# Patient Record
Sex: Female | Born: 1980 | Race: Black or African American | Hispanic: No | Marital: Married | Smoking: Current every day smoker
Health system: Southern US, Community
[De-identification: ages and names within clinical notes are randomized; demographics above are authoritative.]

## PROBLEM LIST (undated history)

## (undated) DIAGNOSIS — R51 Headache: Secondary | ICD-10-CM

## (undated) DIAGNOSIS — R0602 Shortness of breath: Secondary | ICD-10-CM

## (undated) DIAGNOSIS — F32A Depression, unspecified: Secondary | ICD-10-CM

## (undated) DIAGNOSIS — M797 Fibromyalgia: Secondary | ICD-10-CM

## (undated) DIAGNOSIS — I10 Essential (primary) hypertension: Secondary | ICD-10-CM

## (undated) DIAGNOSIS — M549 Dorsalgia, unspecified: Secondary | ICD-10-CM

## (undated) DIAGNOSIS — G5600 Carpal tunnel syndrome, unspecified upper limb: Secondary | ICD-10-CM

## (undated) DIAGNOSIS — G8929 Other chronic pain: Secondary | ICD-10-CM

## (undated) DIAGNOSIS — N921 Excessive and frequent menstruation with irregular cycle: Secondary | ICD-10-CM

## (undated) DIAGNOSIS — F329 Major depressive disorder, single episode, unspecified: Secondary | ICD-10-CM

## (undated) DIAGNOSIS — F1410 Cocaine abuse, uncomplicated: Secondary | ICD-10-CM

## (undated) DIAGNOSIS — M1990 Unspecified osteoarthritis, unspecified site: Secondary | ICD-10-CM

## (undated) HISTORY — PX: WISDOM TOOTH EXTRACTION: SHX21

## (undated) HISTORY — DX: Depression, unspecified: F32.A

## (undated) HISTORY — DX: Dorsalgia, unspecified: M54.9

## (undated) HISTORY — DX: Major depressive disorder, single episode, unspecified: F32.9

## (undated) HISTORY — PX: TUBAL LIGATION: SHX77

## (undated) HISTORY — DX: Carpal tunnel syndrome, unspecified upper limb: G56.00

---
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
## 2002-04-04 ENCOUNTER — Encounter: Admission: RE | Admit: 2002-04-04 | Discharge: 2002-04-04 | Payer: Self-pay | Admitting: Family Medicine

## 2002-04-04 ENCOUNTER — Other Ambulatory Visit: Admission: RE | Admit: 2002-04-04 | Discharge: 2002-04-04 | Payer: Self-pay | Admitting: Family Medicine

## 2002-04-11 ENCOUNTER — Encounter: Admission: RE | Admit: 2002-04-11 | Discharge: 2002-04-11 | Payer: Self-pay | Admitting: Family Medicine

## 2002-04-25 ENCOUNTER — Encounter: Admission: RE | Admit: 2002-04-25 | Discharge: 2002-04-25 | Payer: Self-pay | Admitting: Family Medicine

## 2002-05-25 ENCOUNTER — Encounter: Admission: RE | Admit: 2002-05-25 | Discharge: 2002-05-25 | Payer: Self-pay | Admitting: Family Medicine

## 2002-06-27 ENCOUNTER — Encounter: Admission: RE | Admit: 2002-06-27 | Discharge: 2002-06-27 | Payer: Self-pay | Admitting: Family Medicine

## 2002-07-06 ENCOUNTER — Encounter: Admission: RE | Admit: 2002-07-06 | Discharge: 2002-07-06 | Payer: Self-pay | Admitting: Family Medicine

## 2002-07-11 ENCOUNTER — Encounter: Admission: RE | Admit: 2002-07-11 | Discharge: 2002-07-11 | Payer: Self-pay | Admitting: Family Medicine

## 2002-08-13 ENCOUNTER — Encounter: Admission: RE | Admit: 2002-08-13 | Discharge: 2002-08-13 | Payer: Self-pay | Admitting: Family Medicine

## 2002-09-05 ENCOUNTER — Encounter: Admission: RE | Admit: 2002-09-05 | Discharge: 2002-09-05 | Payer: Self-pay | Admitting: Family Medicine

## 2002-09-14 ENCOUNTER — Encounter: Admission: RE | Admit: 2002-09-14 | Discharge: 2002-09-14 | Payer: Self-pay | Admitting: Sports Medicine

## 2002-10-15 ENCOUNTER — Encounter: Admission: RE | Admit: 2002-10-15 | Discharge: 2002-10-15 | Payer: Self-pay | Admitting: Sports Medicine

## 2002-11-04 ENCOUNTER — Emergency Department (HOSPITAL_COMMUNITY): Admission: EM | Admit: 2002-11-04 | Discharge: 2002-11-04 | Payer: Self-pay | Admitting: Emergency Medicine

## 2002-11-07 ENCOUNTER — Encounter: Admission: RE | Admit: 2002-11-07 | Discharge: 2002-11-07 | Payer: Self-pay | Admitting: Family Medicine

## 2002-12-07 ENCOUNTER — Encounter: Admission: RE | Admit: 2002-12-07 | Discharge: 2002-12-07 | Payer: Self-pay | Admitting: Family Medicine

## 2003-02-27 ENCOUNTER — Encounter: Admission: RE | Admit: 2003-02-27 | Discharge: 2003-02-27 | Payer: Self-pay | Admitting: Family Medicine

## 2003-05-17 ENCOUNTER — Encounter: Admission: RE | Admit: 2003-05-17 | Discharge: 2003-05-17 | Payer: Self-pay | Admitting: Sports Medicine

## 2003-10-31 ENCOUNTER — Ambulatory Visit: Payer: Self-pay | Admitting: Sports Medicine

## 2003-11-29 ENCOUNTER — Ambulatory Visit: Payer: Self-pay | Admitting: Family Medicine

## 2003-12-21 ENCOUNTER — Emergency Department (HOSPITAL_COMMUNITY): Admission: EM | Admit: 2003-12-21 | Discharge: 2003-12-21 | Payer: Self-pay | Admitting: Emergency Medicine

## 2003-12-23 ENCOUNTER — Ambulatory Visit: Payer: Self-pay | Admitting: Family Medicine

## 2003-12-24 ENCOUNTER — Ambulatory Visit (HOSPITAL_COMMUNITY): Admission: RE | Admit: 2003-12-24 | Discharge: 2003-12-24 | Payer: Self-pay | Admitting: Family Medicine

## 2004-01-31 ENCOUNTER — Ambulatory Visit: Payer: Self-pay | Admitting: Family Medicine

## 2004-03-17 ENCOUNTER — Ambulatory Visit: Payer: Self-pay | Admitting: Family Medicine

## 2004-03-27 ENCOUNTER — Encounter (INDEPENDENT_AMBULATORY_CARE_PROVIDER_SITE_OTHER): Payer: Self-pay | Admitting: Specialist

## 2004-03-27 ENCOUNTER — Ambulatory Visit: Payer: Self-pay | Admitting: Family Medicine

## 2004-03-27 ENCOUNTER — Other Ambulatory Visit: Admission: RE | Admit: 2004-03-27 | Discharge: 2004-03-27 | Payer: Self-pay | Admitting: Family Medicine

## 2004-04-24 ENCOUNTER — Ambulatory Visit: Payer: Self-pay | Admitting: Family Medicine

## 2004-05-08 ENCOUNTER — Ambulatory Visit: Payer: Self-pay | Admitting: Family Medicine

## 2004-07-13 ENCOUNTER — Ambulatory Visit: Payer: Self-pay | Admitting: Family Medicine

## 2004-08-04 ENCOUNTER — Ambulatory Visit: Payer: Self-pay | Admitting: Family Medicine

## 2004-08-31 ENCOUNTER — Emergency Department (HOSPITAL_COMMUNITY): Admission: EM | Admit: 2004-08-31 | Discharge: 2004-08-31 | Payer: Self-pay | Admitting: Family Medicine

## 2004-09-07 ENCOUNTER — Emergency Department (HOSPITAL_COMMUNITY): Admission: EM | Admit: 2004-09-07 | Discharge: 2004-09-07 | Payer: Self-pay | Admitting: Family Medicine

## 2005-04-02 ENCOUNTER — Ambulatory Visit: Payer: Self-pay | Admitting: Family Medicine

## 2005-06-11 ENCOUNTER — Ambulatory Visit: Payer: Self-pay | Admitting: Family Medicine

## 2005-06-14 ENCOUNTER — Ambulatory Visit (HOSPITAL_COMMUNITY): Admission: RE | Admit: 2005-06-14 | Discharge: 2005-06-14 | Payer: Self-pay | Admitting: Family Medicine

## 2005-06-29 ENCOUNTER — Encounter (INDEPENDENT_AMBULATORY_CARE_PROVIDER_SITE_OTHER): Payer: Self-pay | Admitting: *Deleted

## 2005-06-29 LAB — CONVERTED CEMR LAB

## 2005-07-01 ENCOUNTER — Inpatient Hospital Stay (HOSPITAL_COMMUNITY): Admission: AD | Admit: 2005-07-01 | Discharge: 2005-07-01 | Payer: Self-pay | Admitting: Gynecology

## 2005-07-02 ENCOUNTER — Ambulatory Visit: Payer: Self-pay | Admitting: Family Medicine

## 2005-07-02 ENCOUNTER — Other Ambulatory Visit: Admission: RE | Admit: 2005-07-02 | Discharge: 2005-07-02 | Payer: Self-pay | Admitting: Family Medicine

## 2005-08-05 ENCOUNTER — Ambulatory Visit: Payer: Self-pay | Admitting: Family Medicine

## 2005-08-30 ENCOUNTER — Ambulatory Visit (HOSPITAL_COMMUNITY): Admission: RE | Admit: 2005-08-30 | Discharge: 2005-08-30 | Payer: Self-pay | Admitting: Family Medicine

## 2005-09-08 ENCOUNTER — Ambulatory Visit: Payer: Self-pay | Admitting: Family Medicine

## 2005-10-07 ENCOUNTER — Ambulatory Visit: Payer: Self-pay | Admitting: Family Medicine

## 2005-10-27 ENCOUNTER — Ambulatory Visit: Payer: Self-pay | Admitting: Family Medicine

## 2005-11-17 ENCOUNTER — Ambulatory Visit (HOSPITAL_COMMUNITY): Admission: RE | Admit: 2005-11-17 | Discharge: 2005-11-17 | Payer: Self-pay | Admitting: Family Medicine

## 2005-11-25 ENCOUNTER — Ambulatory Visit: Payer: Self-pay | Admitting: Sports Medicine

## 2005-11-25 ENCOUNTER — Ambulatory Visit: Payer: Self-pay | Admitting: *Deleted

## 2005-11-25 ENCOUNTER — Inpatient Hospital Stay (HOSPITAL_COMMUNITY): Admission: AD | Admit: 2005-11-25 | Discharge: 2005-11-25 | Payer: Self-pay | Admitting: Family Medicine

## 2005-12-02 ENCOUNTER — Ambulatory Visit: Payer: Self-pay | Admitting: Family Medicine

## 2005-12-08 ENCOUNTER — Inpatient Hospital Stay (HOSPITAL_COMMUNITY): Admission: AD | Admit: 2005-12-08 | Discharge: 2005-12-08 | Payer: Self-pay | Admitting: Gynecology

## 2005-12-08 ENCOUNTER — Ambulatory Visit: Payer: Self-pay | Admitting: Certified Nurse Midwife

## 2005-12-10 ENCOUNTER — Inpatient Hospital Stay (HOSPITAL_COMMUNITY): Admission: AD | Admit: 2005-12-10 | Discharge: 2005-12-10 | Payer: Self-pay | Admitting: Obstetrics & Gynecology

## 2005-12-10 ENCOUNTER — Ambulatory Visit: Payer: Self-pay | Admitting: *Deleted

## 2005-12-10 ENCOUNTER — Ambulatory Visit: Payer: Self-pay | Admitting: Family Medicine

## 2005-12-15 ENCOUNTER — Ambulatory Visit: Payer: Self-pay | Admitting: Family Medicine

## 2005-12-20 ENCOUNTER — Ambulatory Visit: Payer: Self-pay | Admitting: Family Medicine

## 2005-12-27 ENCOUNTER — Ambulatory Visit: Payer: Self-pay | Admitting: Sports Medicine

## 2006-01-06 ENCOUNTER — Ambulatory Visit: Payer: Self-pay | Admitting: Sports Medicine

## 2006-01-13 ENCOUNTER — Ambulatory Visit: Payer: Self-pay | Admitting: Sports Medicine

## 2006-01-14 ENCOUNTER — Ambulatory Visit: Payer: Self-pay | Admitting: Gynecology

## 2006-01-14 ENCOUNTER — Inpatient Hospital Stay (HOSPITAL_COMMUNITY): Admission: AD | Admit: 2006-01-14 | Discharge: 2006-01-17 | Payer: Self-pay | Admitting: Gynecology

## 2006-01-15 ENCOUNTER — Encounter (INDEPENDENT_AMBULATORY_CARE_PROVIDER_SITE_OTHER): Payer: Self-pay | Admitting: Specialist

## 2006-03-22 ENCOUNTER — Ambulatory Visit: Payer: Self-pay | Admitting: Family Medicine

## 2006-04-29 ENCOUNTER — Encounter (INDEPENDENT_AMBULATORY_CARE_PROVIDER_SITE_OTHER): Payer: Self-pay | Admitting: *Deleted

## 2006-08-31 ENCOUNTER — Ambulatory Visit: Payer: Self-pay | Admitting: Sports Medicine

## 2006-08-31 ENCOUNTER — Encounter (INDEPENDENT_AMBULATORY_CARE_PROVIDER_SITE_OTHER): Payer: Self-pay | Admitting: Family Medicine

## 2006-08-31 ENCOUNTER — Other Ambulatory Visit: Admission: RE | Admit: 2006-08-31 | Discharge: 2006-08-31 | Payer: Self-pay | Admitting: Family Medicine

## 2006-08-31 LAB — CONVERTED CEMR LAB
Chlamydia, DNA Probe: NEGATIVE
GC Probe Amp, Genital: NEGATIVE

## 2006-09-06 ENCOUNTER — Encounter (INDEPENDENT_AMBULATORY_CARE_PROVIDER_SITE_OTHER): Payer: Self-pay | Admitting: Family Medicine

## 2006-10-26 ENCOUNTER — Encounter: Admission: RE | Admit: 2006-10-26 | Discharge: 2006-10-26 | Payer: Self-pay | Admitting: Sports Medicine

## 2006-10-26 ENCOUNTER — Ambulatory Visit: Payer: Self-pay | Admitting: Family Medicine

## 2006-12-28 ENCOUNTER — Telehealth: Payer: Self-pay | Admitting: *Deleted

## 2007-01-16 IMAGING — US US OB TRANSVAGINAL
1 series · 13 of 28 positions shown · non-contrast
Comparison: none

CLINICAL DATA: 11 weeks estimated gestational age with left lower quadrant pain.  
 OBSTETRICAL ULTRASOUND <14 WKS AND TRANSVAGINAL OB US:
TECHNIQUE: Both transabdominal and transvaginal ultrasound examinations were performed for complete evaluation of the gestation as well as the maternal uterus, adnexal regions, and pelvic cul-de-sac.
 Correlation is made with the previous exam on 06/14/05.

[Series 1: us ob transvaginal · 0.14mm/px · 13 of 52 slices shown]
[im 2/52]
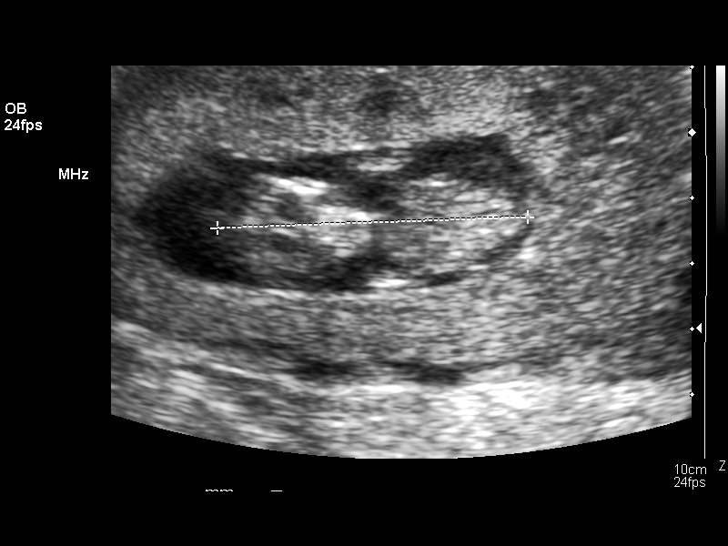
[im 6/52]
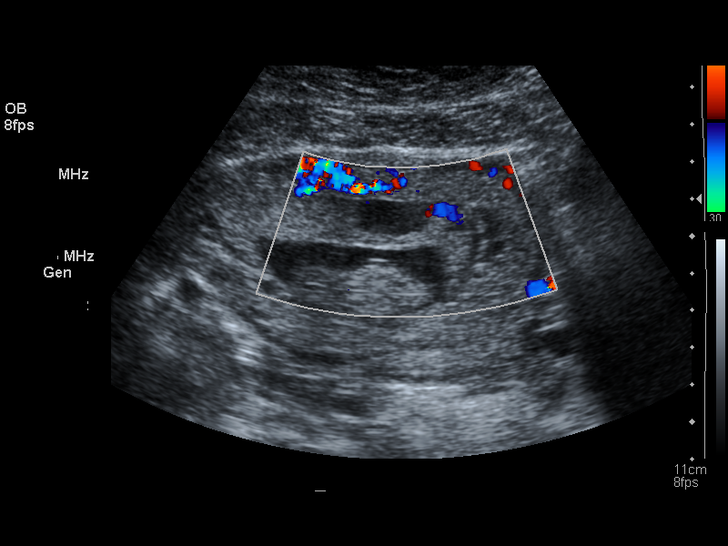
[im 10/52]
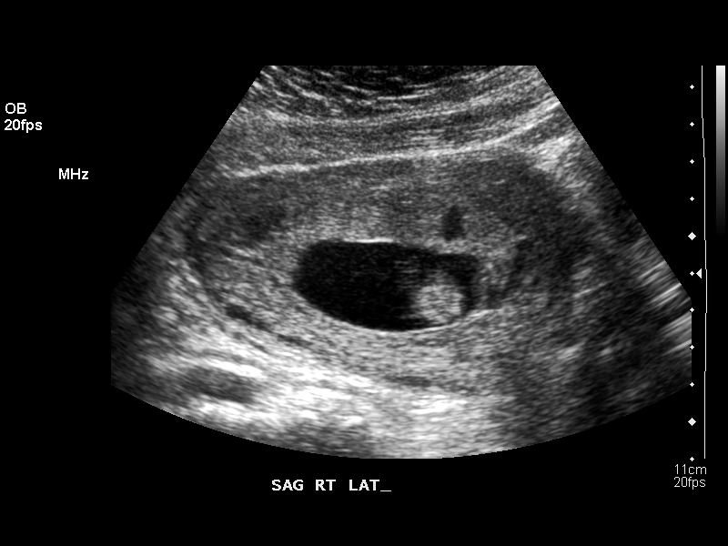
[im 14/52]
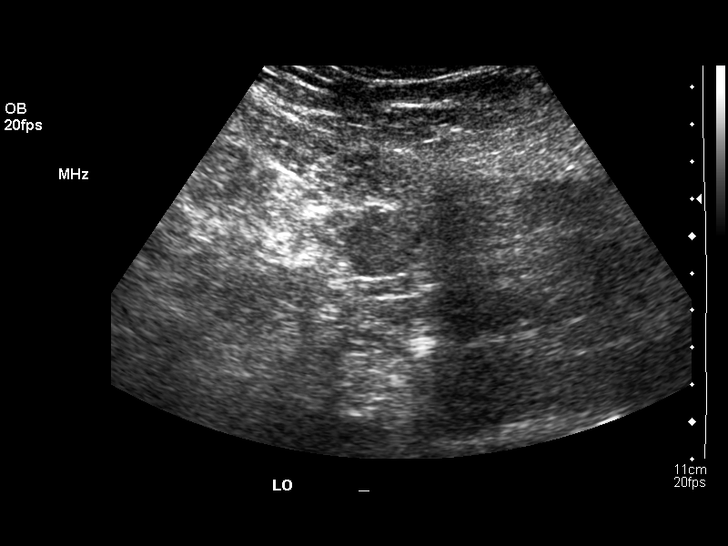
[im 18/52]
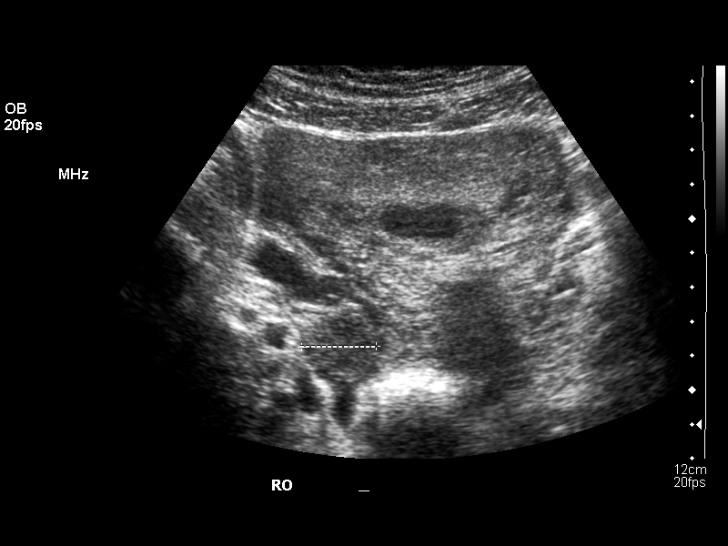
[im 21/52]
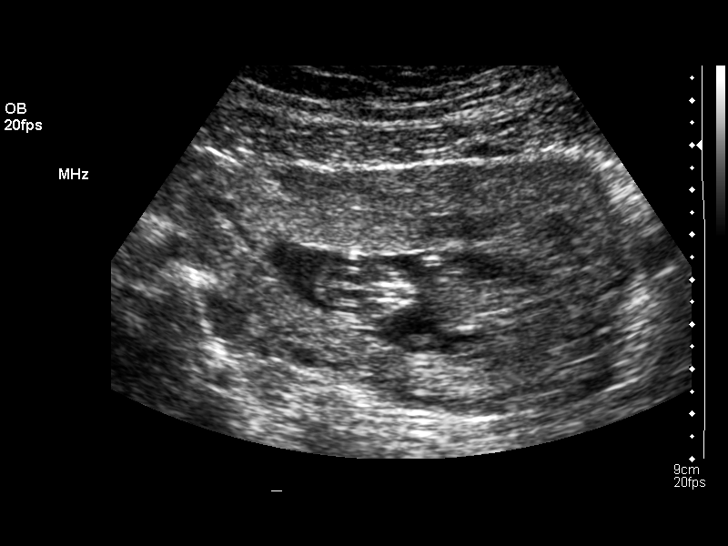
[im 27/52]
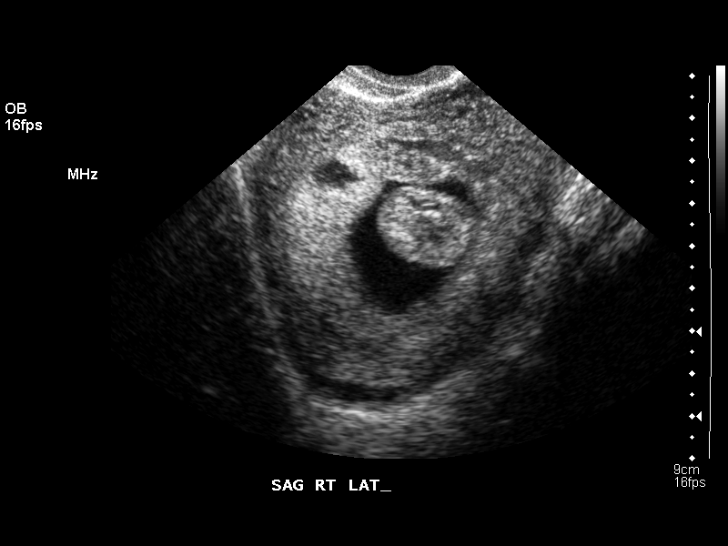
[im 31/52]
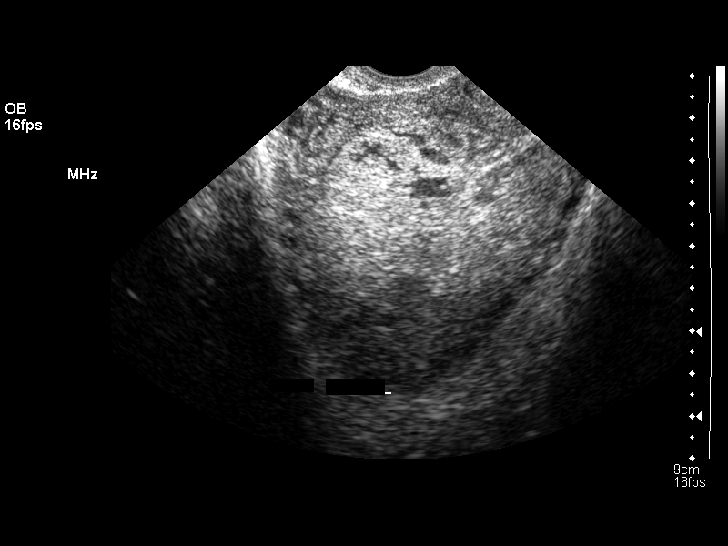
[im 35/52]
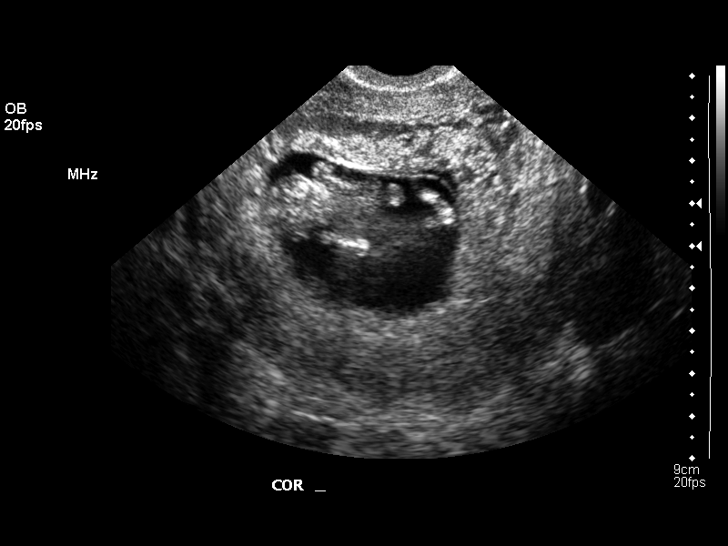
[im 38/52]
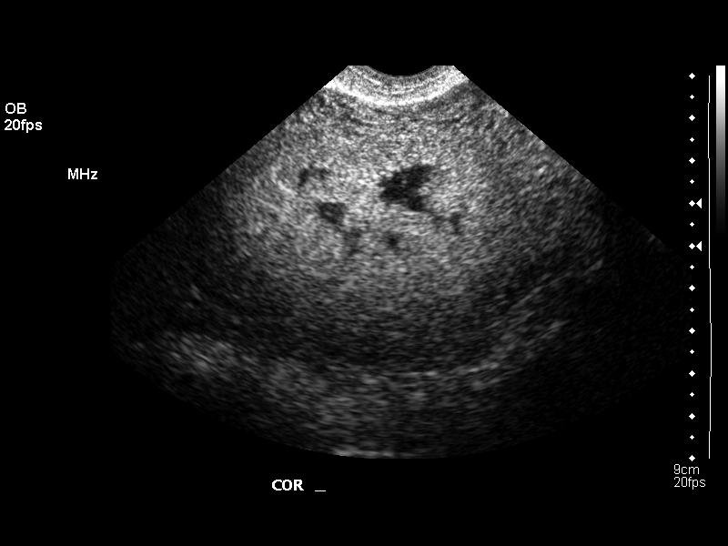
[im 42/52]
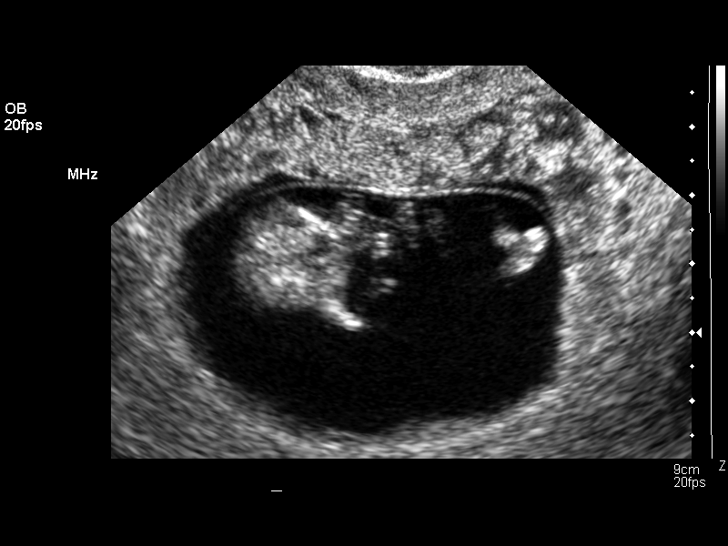
[im 46/52]
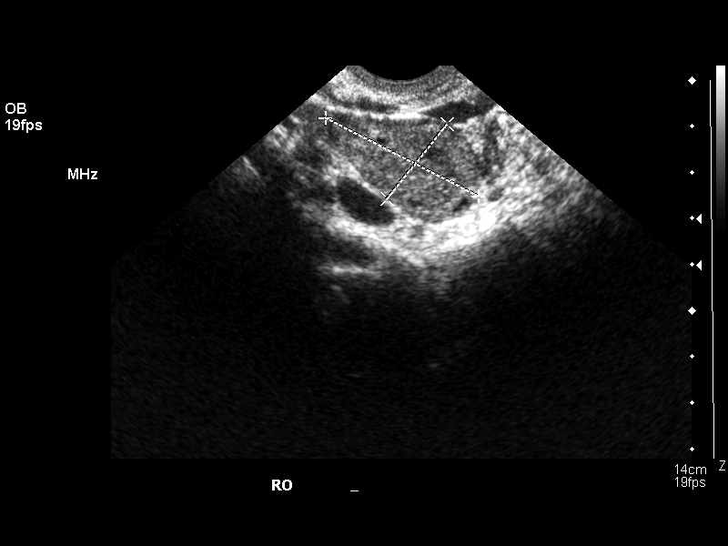
[im 50/52]
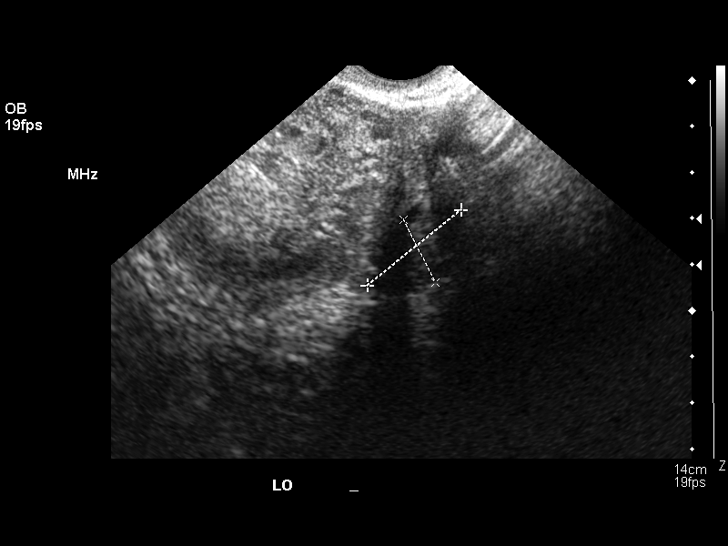

[13 of 28 positions shown; findings below may reference images not displayed]

FINDINGS: Multiple images of the uterus and adnexa were obtained using a transabdominal and endovaginal approaches.
 There is a single intrauterine pregnancy identified that demonstrates an estimated gestational age by crown rump length of 11 weeks and 4 days.  Correlation with expected estimated gestational age by prior ultrasound of 11 weeks and 3 days correlates with appropriate growth.  Limited fetal anatomy could be identified due to the early gestational age.  Full anatomic evaluation is recommended in 7 weeks for complete assessment.
 Both ovaries are seen and have a normal appearance with the right ovary again demonstrating the presence of a corpus luteum cyst measuring 2.5 x 3.7 x 2.1 cm.  The left ovary has a normal appearance measuring 2.6 x 1.5 x 2.6 cm.  No cul-de-sac or periovarian fluid is seen and no separate adnexal masses are noted.
IMPRESSION: 11 week 4 day living intrauterine pregnancy.  Normal ovaries.

## 2007-01-19 ENCOUNTER — Ambulatory Visit: Payer: Self-pay | Admitting: Family Medicine

## 2007-02-02 ENCOUNTER — Encounter (INDEPENDENT_AMBULATORY_CARE_PROVIDER_SITE_OTHER): Payer: Self-pay | Admitting: Family Medicine

## 2007-04-01 ENCOUNTER — Emergency Department (HOSPITAL_COMMUNITY): Admission: EM | Admit: 2007-04-01 | Discharge: 2007-04-01 | Payer: Self-pay | Admitting: Emergency Medicine

## 2007-05-31 ENCOUNTER — Encounter (INDEPENDENT_AMBULATORY_CARE_PROVIDER_SITE_OTHER): Payer: Self-pay | Admitting: Family Medicine

## 2007-05-31 ENCOUNTER — Other Ambulatory Visit: Admission: RE | Admit: 2007-05-31 | Discharge: 2007-05-31 | Payer: Self-pay | Admitting: Pediatrics

## 2007-05-31 ENCOUNTER — Ambulatory Visit: Payer: Self-pay | Admitting: Family Medicine

## 2007-05-31 DIAGNOSIS — F17200 Nicotine dependence, unspecified, uncomplicated: Secondary | ICD-10-CM

## 2007-05-31 LAB — CONVERTED CEMR LAB
Chlamydia, DNA Probe: NEGATIVE
Cholesterol: 132 mg/dL (ref 0–200)
GC Probe Amp, Genital: NEGATIVE
HDL: 50 mg/dL (ref >39)
LDL Cholesterol: 74 mg/dL (ref 0–99)
Total CHOL/HDL Ratio: 2.6
Triglycerides: 41 mg/dL (ref <150)
VLDL: 8 mg/dL (ref 0–40)

## 2007-06-05 LAB — CONVERTED CEMR LAB: Pap Smear: NORMAL

## 2007-06-06 ENCOUNTER — Telehealth: Payer: Self-pay | Admitting: *Deleted

## 2007-06-12 ENCOUNTER — Encounter: Payer: Self-pay | Admitting: *Deleted

## 2007-06-27 IMAGING — US US FETAL BPP W/O NONSTRESS
1 series · 18 of 28 positions shown · non-contrast
Comparison: none

CLINICAL DATA: 25-year-old, gravida 3, para 2 with nonreactive NST.  By assigned EDC of 01/17/06, the patient is 34 weeks 4 days.

[Series 1: us fetal bpp w/o nonstress · non-contrast · 18 of 31 slices shown]
[im 1/31]
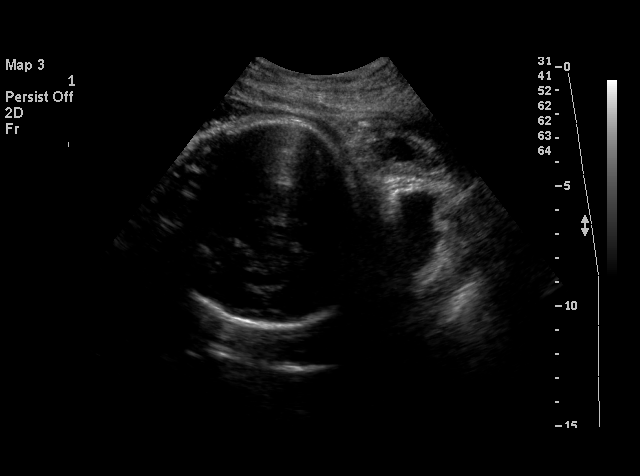
[im 3/31]
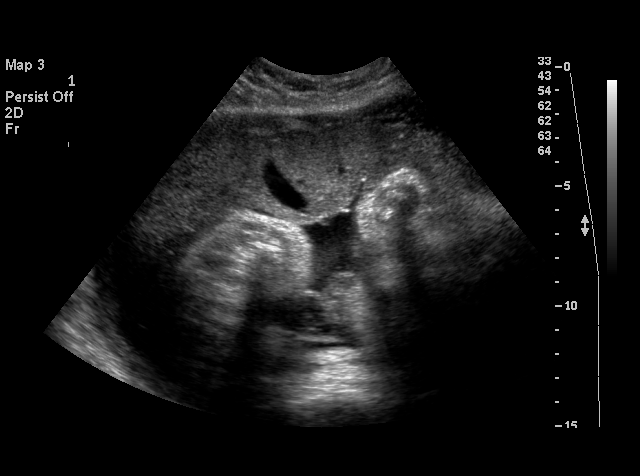
[im 4/31]
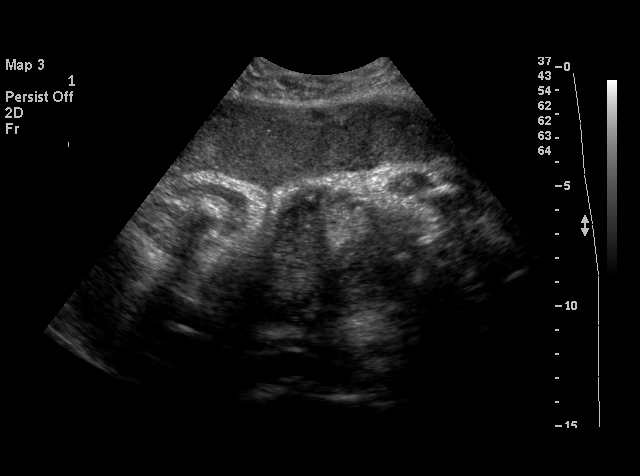
[im 6/31]
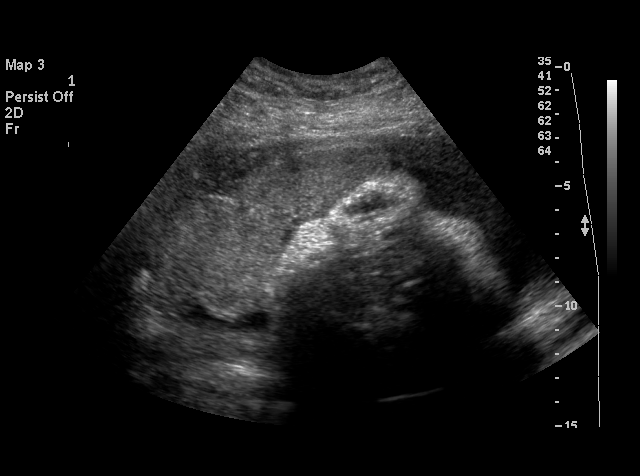
[im 8/31]
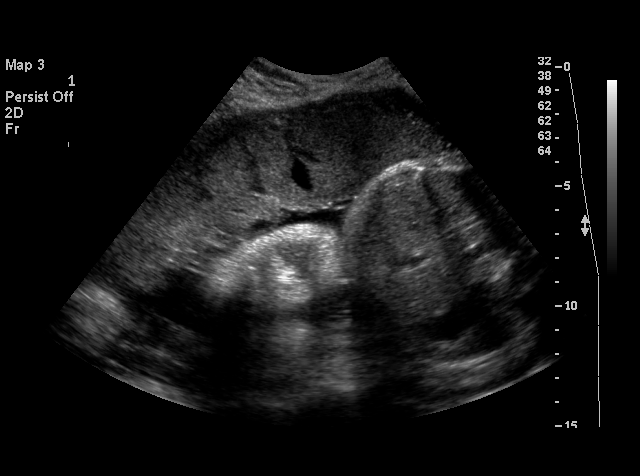
[im 9/31]
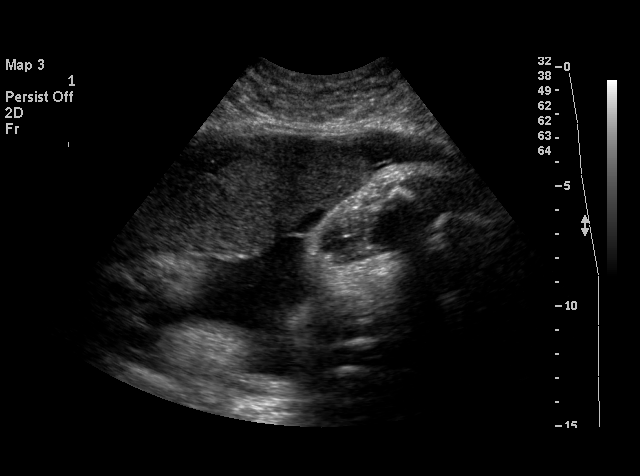
[im 12/31]
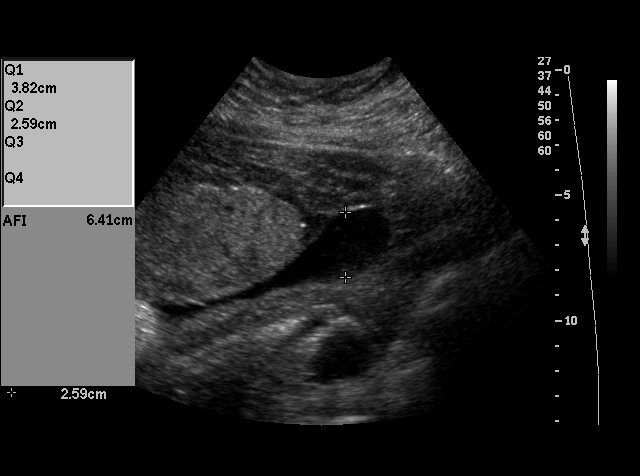
[im 13/31]
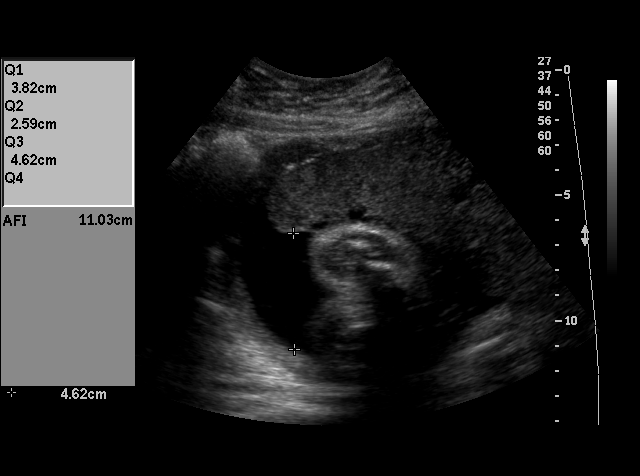
[im 15/31]
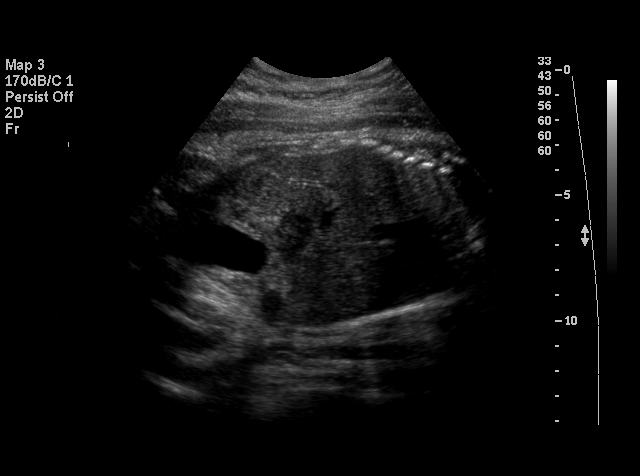
[im 16/31]
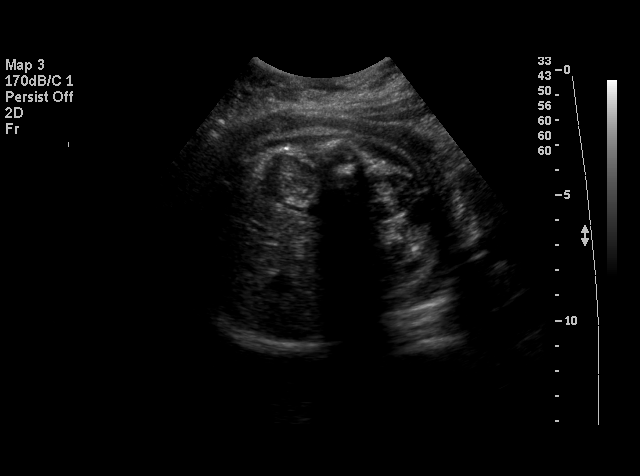
[im 18/31]
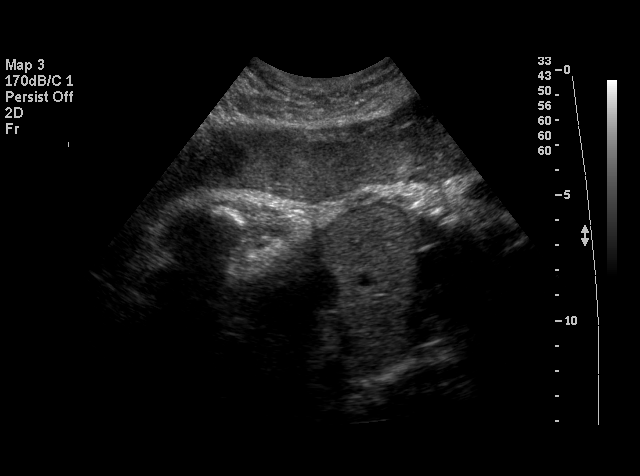
[im 19/31]
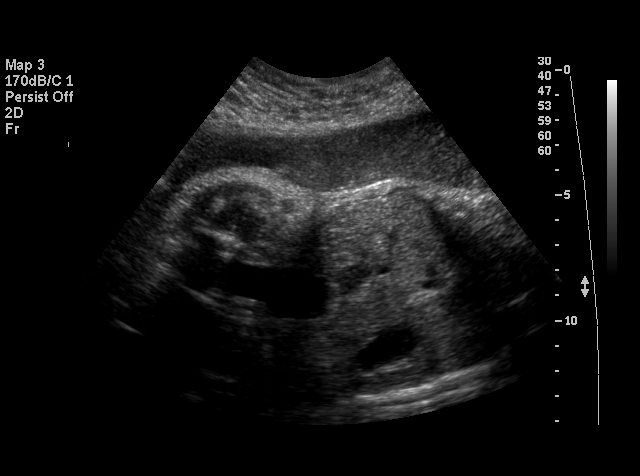
[im 22/31]
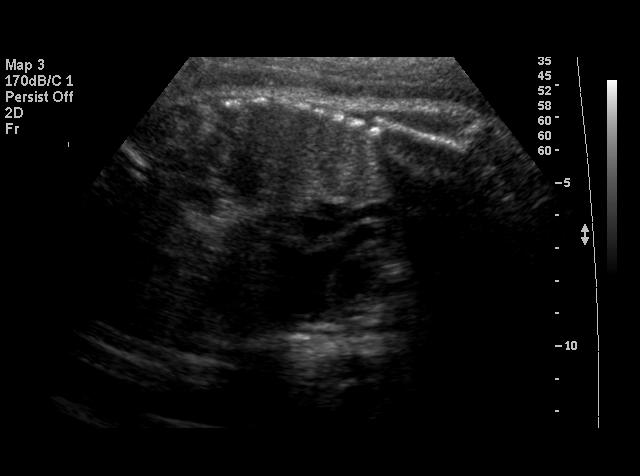
[im 24/31]
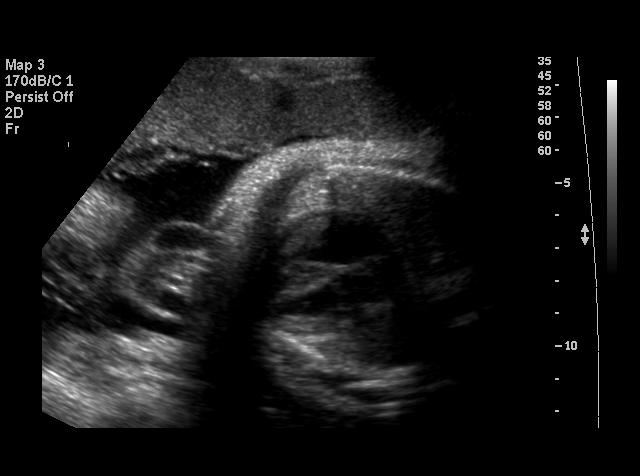
[im 25/31]
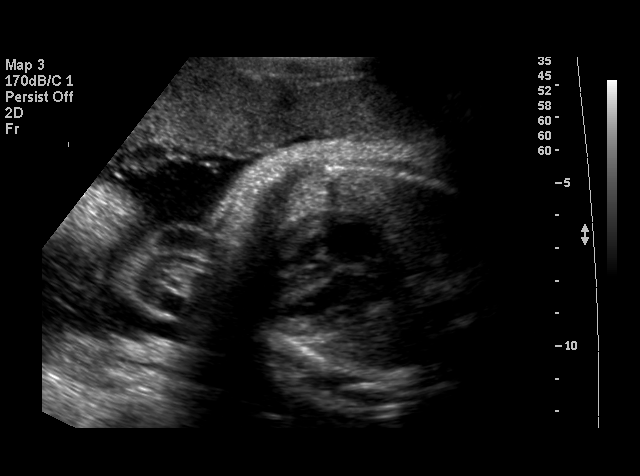
[im 27/31]
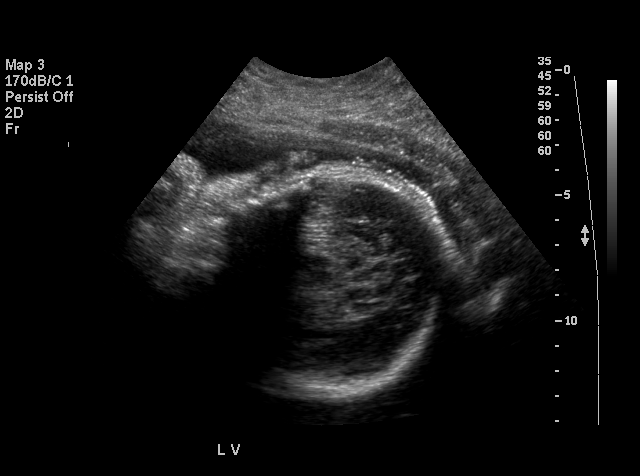
[im 28/31]
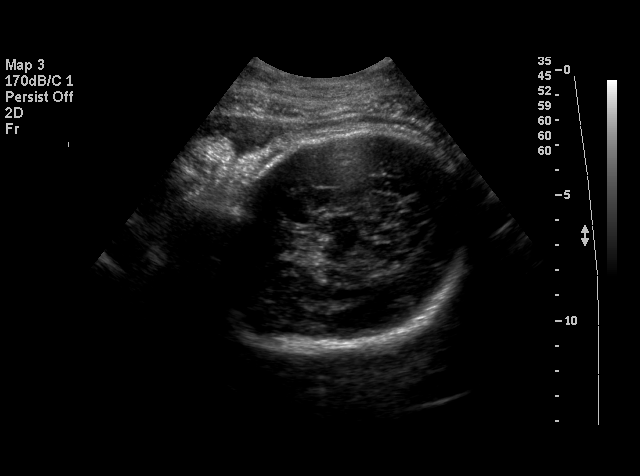
[im 31/31]
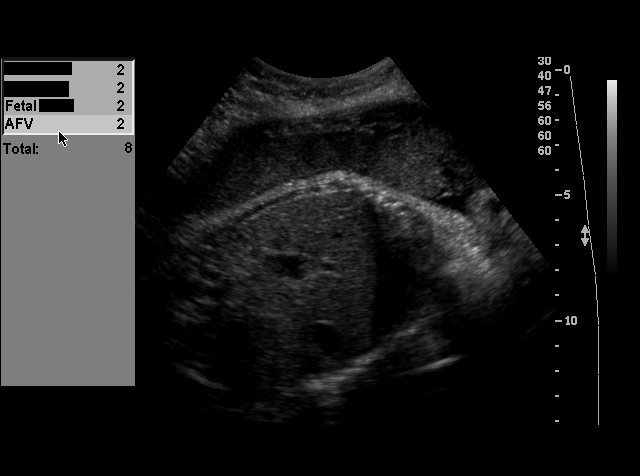

[18 of 28 positions shown; findings below may reference images not displayed]

BIOPHYSICAL PROFILE

 Number of Fetuses:  1
 Heart rate:  154 bpm
 Movement:  Yes
 Breathing:  Yes
 Presentation:  Cephalic
 Placental Location:  Anterior
 Grade:  II
 Previa:  No
 Amniotic Fluid (Subjective):  Normal

 Fetal measurements and complete anatomic evaluation were not requested.  The following fetal anatomy was visualized on this exam:  Lateral ventricles, thalami/CSP, posterior fossa, 4-chamber heart, stomach, kidneys, bladder, LVOT, and diaphragm.  

 BPP SCORING
 Movements:  2
 Breathing:  2
 Tone:  2
 Amniotic Fluid:  2
 Total Score:  8

 MATERNAL UTERINE AND ADNEXAL FINDINGS
 Cervix:  Not evaluated.
IMPRESSION: Single living intrauterine fetus in cephalic presentation.  Amniotic fluid volume is within normal limits.  Biophysical profile of [DATE].

## 2007-11-07 ENCOUNTER — Telehealth (INDEPENDENT_AMBULATORY_CARE_PROVIDER_SITE_OTHER): Payer: Self-pay | Admitting: Family Medicine

## 2007-12-26 ENCOUNTER — Telehealth: Payer: Self-pay | Admitting: Family Medicine

## 2008-01-22 ENCOUNTER — Telehealth (INDEPENDENT_AMBULATORY_CARE_PROVIDER_SITE_OTHER): Payer: Self-pay | Admitting: Family Medicine

## 2008-09-20 ENCOUNTER — Emergency Department (HOSPITAL_COMMUNITY): Admission: EM | Admit: 2008-09-20 | Discharge: 2008-09-21 | Payer: Self-pay | Admitting: Emergency Medicine

## 2008-10-04 ENCOUNTER — Emergency Department (HOSPITAL_COMMUNITY): Admission: EM | Admit: 2008-10-04 | Discharge: 2008-10-04 | Payer: Self-pay | Admitting: Emergency Medicine

## 2008-10-04 ENCOUNTER — Ambulatory Visit: Payer: Self-pay | Admitting: Family Medicine

## 2008-10-04 LAB — CONVERTED CEMR LAB
Beta hcg, urine, semiquantitative: NEGATIVE
Bilirubin Urine: NEGATIVE
Glucose, Urine, Semiquant: NEGATIVE
Ketones, urine, test strip: NEGATIVE
Nitrite: NEGATIVE
Protein, U semiquant: 30
Specific Gravity, Urine: 1.015
Urobilinogen, UA: 2
pH: 7

## 2008-11-06 ENCOUNTER — Encounter: Payer: Self-pay | Admitting: Family Medicine

## 2008-11-06 ENCOUNTER — Other Ambulatory Visit: Admission: RE | Admit: 2008-11-06 | Discharge: 2008-11-06 | Payer: Self-pay | Admitting: Family Medicine

## 2008-11-06 ENCOUNTER — Ambulatory Visit: Payer: Self-pay | Admitting: Family Medicine

## 2008-11-06 DIAGNOSIS — F329 Major depressive disorder, single episode, unspecified: Secondary | ICD-10-CM | POA: Insufficient documentation

## 2008-11-06 LAB — CONVERTED CEMR LAB
Chlamydia, DNA Probe: NEGATIVE
GC Probe Amp, Genital: NEGATIVE

## 2008-11-08 ENCOUNTER — Encounter: Payer: Self-pay | Admitting: Family Medicine

## 2009-04-09 ENCOUNTER — Ambulatory Visit: Payer: Self-pay | Admitting: Family Medicine

## 2009-04-09 ENCOUNTER — Encounter: Payer: Self-pay | Admitting: Family Medicine

## 2009-04-09 DIAGNOSIS — M545 Low back pain, unspecified: Secondary | ICD-10-CM | POA: Insufficient documentation

## 2009-04-09 DIAGNOSIS — G8929 Other chronic pain: Secondary | ICD-10-CM

## 2009-04-09 DIAGNOSIS — I10 Essential (primary) hypertension: Secondary | ICD-10-CM | POA: Insufficient documentation

## 2009-04-09 LAB — CONVERTED CEMR LAB
Beta hcg, urine, semiquantitative: NEGATIVE
Bilirubin Urine: NEGATIVE
Blood in Urine, dipstick: NEGATIVE
Glucose, Urine, Semiquant: NEGATIVE
Ketones, urine, test strip: NEGATIVE
Nitrite: NEGATIVE
Specific Gravity, Urine: 1.02
Urobilinogen, UA: 0.2
WBC Urine, dipstick: NEGATIVE
pH: 8

## 2009-04-10 ENCOUNTER — Encounter: Payer: Self-pay | Admitting: Family Medicine

## 2009-04-11 ENCOUNTER — Telehealth: Payer: Self-pay | Admitting: *Deleted

## 2009-05-02 ENCOUNTER — Emergency Department (HOSPITAL_COMMUNITY): Admission: EM | Admit: 2009-05-02 | Discharge: 2009-05-02 | Payer: Self-pay | Admitting: Emergency Medicine

## 2009-05-05 ENCOUNTER — Encounter: Payer: Self-pay | Admitting: Family Medicine

## 2009-09-04 ENCOUNTER — Encounter: Payer: Self-pay | Admitting: Family Medicine

## 2009-09-04 ENCOUNTER — Encounter: Payer: Self-pay | Admitting: *Deleted

## 2009-09-04 ENCOUNTER — Ambulatory Visit: Payer: Self-pay | Admitting: Family Medicine

## 2009-09-04 DIAGNOSIS — G5600 Carpal tunnel syndrome, unspecified upper limb: Secondary | ICD-10-CM | POA: Insufficient documentation

## 2009-09-04 DIAGNOSIS — R519 Headache, unspecified: Secondary | ICD-10-CM | POA: Insufficient documentation

## 2009-09-04 DIAGNOSIS — R51 Headache: Secondary | ICD-10-CM | POA: Insufficient documentation

## 2009-09-04 DIAGNOSIS — E669 Obesity, unspecified: Secondary | ICD-10-CM | POA: Insufficient documentation

## 2009-09-04 LAB — CONVERTED CEMR LAB
BUN: 9 mg/dL (ref 6–23)
CO2: 23 meq/L (ref 19–32)
Calcium: 9.2 mg/dL (ref 8.4–10.5)
Chloride: 107 meq/L (ref 96–112)
Creatinine, Ser: 0.75 mg/dL (ref 0.40–1.20)
Glucose, Bld: 80 mg/dL (ref 70–99)
Potassium: 4.1 meq/L (ref 3.5–5.3)
Sodium: 141 meq/L (ref 135–145)

## 2009-09-11 ENCOUNTER — Encounter: Payer: Self-pay | Admitting: *Deleted

## 2009-09-15 ENCOUNTER — Ambulatory Visit (HOSPITAL_COMMUNITY): Admission: RE | Admit: 2009-09-15 | Discharge: 2009-09-15 | Payer: Self-pay | Admitting: Family Medicine

## 2009-09-16 ENCOUNTER — Telehealth: Payer: Self-pay | Admitting: Family Medicine

## 2010-02-13 ENCOUNTER — Ambulatory Visit: Payer: Self-pay

## 2010-02-20 ENCOUNTER — Emergency Department (HOSPITAL_COMMUNITY)
Admission: EM | Admit: 2010-02-20 | Discharge: 2010-02-20 | Payer: Self-pay | Source: Home / Self Care | Admitting: Family Medicine

## 2010-03-06 ENCOUNTER — Ambulatory Visit: Admission: RE | Admit: 2010-03-06 | Discharge: 2010-03-06 | Payer: Self-pay | Source: Home / Self Care

## 2010-03-06 ENCOUNTER — Encounter: Payer: Self-pay | Admitting: Family Medicine

## 2010-03-10 ENCOUNTER — Encounter: Payer: Self-pay | Admitting: *Deleted

## 2010-03-11 ENCOUNTER — Encounter: Payer: Self-pay | Admitting: *Deleted

## 2010-03-22 ENCOUNTER — Encounter: Payer: Self-pay | Admitting: Family Medicine

## 2010-03-31 NOTE — Progress Notes (Signed)
Summary: Back pain-MRI results   Phone Note Outgoing Call   Call placed by: Milinda Antis MD,  September 16, 2009 5:13 PM Details for Reason: MRI results Summary of Call: Discussed small disc buldge secondary to annular tear,, pain minimally improved with Muscle relaxant and Ultram, encouraged pt to continue and do ROM, follow-up in 6 weeks, if still having trouble refer to PT, discouraged any surgical evaluation unless her pain changes

## 2010-03-31 NOTE — Miscellaneous (Signed)
Summary: gave tamilfu away  Medications Added TAMIFLU 75 MG CAPS (OSELTAMIVIR PHOSPHATE) 1 by mouth two times a day       Clinical Lists Changes she took 3 days then her fiance started getting sick with same illness. she gave him 2 of her pills & wanted to know if she can get more. She got this from ED last friday. told her she was out of the window for this to work as well as her fiancee. explained it did not cure the flu, but shortened the number of days you are ill. she wanted pcp to know & requested more tamilfu. told her unlikly but I will send the message./ she uses [facility] on [address] RN  May 05, 2009 2:20 PM    Medications: Added new medication of TAMIFLU 75 MG CAPS (OSELTAMIVIR PHOSPHATE) 1 by mouth two times a day - Signed Rx of TAMIFLU 75 MG CAPS (OSELTAMIVIR PHOSPHATE) 1 by mouth two times a day;  #3 x 0;  Signed;  Entered by: Milinda Antis MD;  Authorized by: Milinda Antis MD;  Method used: Electronically to [facility]. [place]. [phone]*, [address] [place], [place], [place]  [address], Ph: [phone] or [phone], Fax: [phone]      Discussed with pt diagnosis of Influenza, she was seen in ED on Friday 3/4 and diagnosed also given Tussionex and PCN for dental infection. She have her fiancee 2 of her pills because he had the same symptoms. her last Tamiflu was this AM.  If I am correct, she took one Friday 3/4 , two times a day 3/5, two times a day 3/6, one pill 3/7, therefore I will give her the remaining three pills, she has 1 left at home [facility] MD  May 05, 2009 4:56 PM     Prescriptions: TAMIFLU 75 MG CAPS (OSELTAMIVIR PHOSPHATE) 1 by mouth two times a day  #3 x 0   Entered and Authorized by:   Milinda Antis MD   Signed by:   Milinda Antis MD on 05/05/2009   Method used:   Electronically to        [facility] [address] [phone]* (retail)       [address]       [place], [place]  [address]  Ph: [phone] or [phone]       Fax: [phone]   RxID:   [id number]

## 2010-03-31 NOTE — Miscellaneous (Signed)
Summary: re: MRI/TS  MRI SCHEDULED AT [facility]. TUES 09-09-09 AT 9 AM. PT TO ARRIVE AT 8:45 AM OUTPATIENT ADMITTING.   PA AND ORDER FAXED TO [facility] [phone] .Arlyss Repress CMA,  September 04, 2009 5:13 PM pt will call back tomorrow for appt info.Arlyss Repress CMA,  September 04, 2009 5:14 PM  also let pt know her potassium level was normal..Robert Busick CMA  September 05, 2009 9:51 AM  Clinical Lists Changes CALLED PT. UNABLE TO REACH. NUMBER D/C.Marland KitchenArlyss Repress CMA,  September 05, 2009 4:01 PM called pt again..number d/c.Marland KitchenArlyss Repress CMA,  September 08, 2009 8:55 AM called [facility] and spoke with Victorino Dike and cancelled the appt for the MRI scan tomorrow. unable to reach pt.Arlyss Repress CMA,  September 08, 2009 5:10 PM

## 2010-03-31 NOTE — Miscellaneous (Signed)
Summary: re: MRI/TS  pt called and will r/s her MRI appt.Arlyss Repress CMA,  September 11, 2009 11:31 AM pt called back...MRI on Monday at 3:30 pm..Alph[facility],  September 11, 2009 11:50 AM  Clinical Lists Changes

## 2010-03-31 NOTE — Miscellaneous (Signed)
   Clinical Lists Changes  Problems: Removed problem of ABDOMINAL PAIN (ICD-789.00) Removed problem of WELL WOMAN (ICD-V70.0) Removed problem of SCREENING FOR LIPOID DISORDERS (ICD-V77.91) Removed problem of VAGINAL DISCHARGE (ICD-623.5) Removed problem of SCREENING FOR MALIGNANT NEOPLASM, CERVIX (ICD-V76.2) Removed problem of HIGH-RISK SEXUAL BEHAVIOR (ICD-V69.2)
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
## 2010-03-31 NOTE — Progress Notes (Signed)
° °  Phone Note Call from Patient   Caller: Patient Reason for Call: Acute Illness Summary of Call: Patient has had a cough, subjective fever for past 2 days.  + runny nose, congestion, and has seen a little blood when blowing her nose hard.  Advised afrin and/or sudafed for congestion, has robitussin at home for cough - will try this.  If she is not improving, advised her to call clinic to make appointment but should try to manage at home for now. Initial call taken by: Norton Blizzard MD,  December 26, 2007 10:29 PM
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
## 2010-03-31 NOTE — Progress Notes (Signed)
Summary: re: UA cx/TS   ---- Converted from flag ---- ---- 04/11/2009 8:35 AM, Milinda Antis MD wrote: Please call pt and let her know urine culture was negative. No antibiotics needed ------------------------------  called pt and lm with pt's mom to call us back.
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
## 2010-04-02 NOTE — Miscellaneous (Signed)
Summary: neuro appt   Clinical Lists Changes called pt lmvm to return call. pt has appt with [facility] 04/16/10 @ 10:20 with Dr Jeral Fruit. phone number is [phone].Marland KitchenMolly Gross [facility] CMA  March 10, 2010 3:17 PM.  attempted to call pt again, number is now disconnected. will mail letter to pt notifing of appt.Carol Gross [facility] CMA  March 11, 2010 3:58 PM
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
## 2010-04-02 NOTE — Letter (Signed)
Summary: Generic Letter  [facility]  [address]   [place], [place] [address]   Phone: [phone]  Fax: [phone]    03/11/2010  JENNFIER ABDULLA [address] [place], [place]  [address]  Dear Ms. Tunks,   I have your appointment with [facility] scheduled for your neurology referral on April 16, 2010 at 10:20am. You will be seeing Dr Jeral Fruit. Their office is located on [address] right across the street from our office. Phone number is [phone], please call them to reschedule at least 24 hours in advance if you are unable to keep this appointment.  Also I was unable to reach you by phone, If you would please call us to give an updated phone number.  If you have any questions feel free to give [place] a call.      Sincerely,   Tessie Fass CMA

## 2010-05-11 LAB — POCT URINALYSIS DIPSTICK
Bilirubin Urine: NEGATIVE
Glucose, UA: NEGATIVE mg/dL
Ketones, ur: NEGATIVE mg/dL
Nitrite: NEGATIVE
Protein, ur: NEGATIVE mg/dL
Specific Gravity, Urine: 1.02 (ref 1.005–1.030)
Urobilinogen, UA: 0.2 mg/dL (ref 0.0–1.0)
pH: 7 (ref 5.0–8.0)

## 2010-05-11 LAB — POCT PREGNANCY, URINE: Preg Test, Ur: NEGATIVE

## 2010-05-14 ENCOUNTER — Other Ambulatory Visit: Payer: Self-pay | Admitting: Family Medicine

## 2010-05-14 NOTE — Telephone Encounter (Signed)
Refill request

## 2010-05-26 ENCOUNTER — Other Ambulatory Visit: Payer: Self-pay | Admitting: Neurosurgery

## 2010-05-26 DIAGNOSIS — M5410 Radiculopathy, site unspecified: Secondary | ICD-10-CM

## 2010-05-26 DIAGNOSIS — M549 Dorsalgia, unspecified: Secondary | ICD-10-CM

## 2010-05-28 ENCOUNTER — Ambulatory Visit
Admission: RE | Admit: 2010-05-28 | Discharge: 2010-05-28 | Disposition: A | Discharge status: Home / Self Care | Payer: Medicaid Other | Source: Ambulatory Visit | Attending: Neurosurgery | Admitting: Neurosurgery

## 2010-05-28 DIAGNOSIS — M549 Dorsalgia, unspecified: Secondary | ICD-10-CM

## 2010-05-28 DIAGNOSIS — M5410 Radiculopathy, site unspecified: Secondary | ICD-10-CM

## 2010-06-06 LAB — COMPREHENSIVE METABOLIC PANEL
ALT: 13 U/L (ref 0–35)
AST: 20 U/L (ref 0–37)
Albumin: 3.8 g/dL (ref 3.5–5.2)
Alkaline Phosphatase: 56 U/L (ref 39–117)
BUN: 8 mg/dL (ref 6–23)
CO2: 22 mEq/L (ref 19–32)
Calcium: 9.3 mg/dL (ref 8.4–10.5)
Chloride: 105 mEq/L (ref 96–112)
Creatinine, Ser: 1.01 mg/dL (ref 0.4–1.2)
GFR calc Af Amer: >60 mL/min (ref >60)
GFR calc non Af Amer: >60 mL/min (ref >60)
Glucose, Bld: 120 mg/dL — ABNORMAL HIGH (ref 70–99)
Potassium: 3.7 mEq/L (ref 3.5–5.1)
Sodium: 136 mEq/L (ref 135–145)
Total Bilirubin: 1.1 mg/dL (ref 0.3–1.2)
Total Protein: 7.3 g/dL (ref 6.0–8.3)

## 2010-06-06 LAB — CULTURE, BLOOD (ROUTINE X 2): Culture: NO GROWTH

## 2010-06-06 LAB — CBC
HCT: 36.2 % (ref 36.0–46.0)
Hemoglobin: 11.8 g/dL — ABNORMAL LOW (ref 12.0–15.0)
MCHC: 32.4 g/dL (ref 30.0–36.0)
MCV: 83 fL (ref 78.0–100.0)
Platelets: 239 10*3/uL (ref 150–400)
RBC: 4.37 MIL/uL (ref 3.87–5.11)
RDW: 16.5 % — ABNORMAL HIGH (ref 11.5–15.5)
WBC: 12.8 10*3/uL — ABNORMAL HIGH (ref 4.0–10.5)

## 2010-06-06 LAB — DIFFERENTIAL
Lymphs Abs: 0.9 10*3/uL (ref 0.7–4.0)
Monocytes Relative: 4 % (ref 3–12)
Neutro Abs: 11.4 10*3/uL — ABNORMAL HIGH (ref 1.7–7.7)
Neutrophils Relative %: 89 % — ABNORMAL HIGH (ref 43–77)

## 2010-06-06 LAB — GLUCOSE, CAPILLARY: Glucose-Capillary: 97 mg/dL (ref 70–99)

## 2010-06-06 LAB — GC/CHLAMYDIA PROBE AMP, GENITAL: GC Probe Amp, Genital: NEGATIVE

## 2010-06-06 LAB — WET PREP, GENITAL

## 2010-06-06 LAB — LIPASE, BLOOD: Lipase: 20 U/L (ref 11–59)

## 2010-06-23 ENCOUNTER — Other Ambulatory Visit: Payer: Self-pay | Admitting: Family Medicine

## 2010-06-23 ENCOUNTER — Ambulatory Visit
Admission: RE | Admit: 2010-06-23 | Discharge: 2010-06-23 | Disposition: A | Discharge status: Home / Self Care | Payer: Medicaid Other | Source: Ambulatory Visit | Attending: Family Medicine | Admitting: Family Medicine

## 2010-06-23 ENCOUNTER — Ambulatory Visit (INDEPENDENT_AMBULATORY_CARE_PROVIDER_SITE_OTHER): Payer: Medicaid Other | Admitting: Family Medicine

## 2010-06-23 DIAGNOSIS — R109 Unspecified abdominal pain: Secondary | ICD-10-CM | POA: Insufficient documentation

## 2010-06-23 MED ORDER — TRAMADOL-ACETAMINOPHEN 37.5-325 MG PO TABS: 1.0000 | ORAL_TABLET | Freq: Four times a day (QID) | ORAL | Status: AC | PRN

## 2010-06-23 MED ORDER — ONDANSETRON HCL 4 MG PO TABS: 4.0000 mg | ORAL_TABLET | Freq: Every day | ORAL | Status: AC | PRN

## 2010-06-23 MED ORDER — IBUPROFEN 600 MG PO TABS
600.0000 mg | ORAL_TABLET | Freq: Three times a day (TID) | ORAL | Status: DC
Start: 2010-06-23 — End: 2011-01-29

## 2010-06-23 MED ORDER — POLYETHYLENE GLYCOL 3350 17 GM/SCOOP PO POWD: 17.0000 g | Freq: Two times a day (BID) | ORAL | Status: AC

## 2010-06-23 MED ORDER — KETOROLAC TROMETHAMINE 30 MG/ML IJ SOLN
30.0000 mg | Freq: Once | INTRAMUSCULAR | Status: AC
  Administered 2010-06-23: 30 mg via INTRAMUSCULAR

## 2010-06-23 NOTE — Progress Notes (Signed)
Addended by: Charm Rings on: 06/23/2010 10:56 AM   Modules accepted: Orders

## 2010-06-23 NOTE — Assessment & Plan Note (Signed)
Overall presentation concerning for dysmenorrhea with contribution of constipation. Pt does report longstanding hx/o abdominal pain ass'd with menstruation in the past. Will obtain pelvic u/s to evaluate for ovarian cysts vs. Ovarian torsion(less likely). Will also obtain abd u/s to evaluate for possible GB disease(less likely though pt with multiple RFs). U preg negative.  Will start pt on scheduled miralax. Prn zofran for nausea. Toradol shot given in clinic. Pt instructed to alternate ultracet and ibuprofen on scheduled basis for pain.  Pt instructed to follow up with PCP in 2-3 days. Pt may benefit from monophasic OCP in the future given concern for possible dysmenorrhea.

## 2010-06-23 NOTE — Progress Notes (Signed)
  Subjective:    Patient ID: Carol Gross, female    DOB: 1980/11/09, 29 y.o.   MRN: [id number]  HPI 78 YOF with 2-3 days of abdominal pain in setting of irregular menses. Pain most prominent in lower abdomen. + Nausea. No vomiting.  No fevers. Menstrual cycle came on 2 weeks early per pt. Has history of significant pain with menses per pt as well as irregular menses. Pt s/p BTL 2007.  + Unprotected sexual activity with fiance. No dysuria, increased urinary frequency. No vaginal discharge/irritation per pt. Past hx/o HSV; on valtrex. No recent outbreaks per pt. Pain not associated with eating. Also previous history LBP, this has been stable per pt. Has been using intermittent ibuprofen for pain.  Pt also reports hx/o recurrent abd pain ass'd with BMs. + Straining with BMs.   Review of Systems See HPI     Objective:   Physical Exam Gen- in bed, in mild distress secondary to pain  CV-RRR PULM-CTAB ABD-Obese abdomen, +TTP in lower abd diffusely, mild RUQ pain, decreased bowel sounds  MSK-Mild TTP in lumbar region     Assessment & Plan:  Abd Pain- Overall presentation concerning for dysmenorrhea with contribution of constipation. Pt does report longstanding hx/o abdominal pain ass'd with menstruation in the past. Will obtain pelvic u/s to evaluate for ovarian cysts vs. Ovarian torsion(less likely). Will also obtain abd u/s to evaluate for possible GB disease(less likely though pt with multiple RFs). U preg negative.  Will start pt on scheduled miralax. Prn zofran for nausea. Toradol shot given in clinic. Pt instructed to alternate ultracet and ibuprofen on scheduled basis for pain.  Pt instructed to follow up with PCP in 2-3 days. Pt may benefit from monophasic OCP in the future given concern for possible dysmenorrhea.

## 2010-06-23 NOTE — Patient Instructions (Signed)
Constipation in Adults Constipation is having fewer than 2 bowel movements per week. Usually, the stools are hard. As we grow older, constipation is more common. If you try to fix constipation with laxatives, the problem may get worse. This is because laxatives taken over a long period of time make the colon muscles weaker. A low-fiber diet, not taking in enough fluids, and taking some medicines may make these problems worse. MEDICATIONS THAT MAY CAUSE CONSTIPATION  Water pills (diuretics).  Calcium channel blockers (used to control blood pressure and for the heart).   Certain pain medicines (narcotics).   Anticholinergics.  Anti-inflammatory agents.   Antacids that contain aluminum.   DISEASES THAT CONTRIBUTE TO CONSTIPATION  Diabetes.  Parkinson's disease.   Dementia.   Stroke.  Depression.   Illnesses that cause problems with salt and water metabolism.   HOME CARE INSTRUCTIONS  Constipation is usually best cared for without medicines. Increasing dietary fiber and eating more fruits and vegetables is the best way to manage constipation.   Slowly increase fiber intake to 25 to 38 grams per day. Whole grains, fruits, vegetables, and legumes are good sources of fiber. A dietitian can further help you incorporate high-fiber foods into your diet.   Drink enough water and fluids to keep your urine clear or pale yellow.   A fiber supplement may be added to your diet if you cannot get enough fiber from foods.   Increasing your activities also helps improve regularity.   Suppositories, as suggested by your caregiver, will also help. If you are using antacids, such as aluminum or calcium containing products, it will be helpful to switch to products containing magnesium if your caregiver says it is okay.   If you have been given a liquid injection (enema) today, this is only a temporary measure. It should not be relied on for treatment of longstanding (chronic) constipation.    Stronger measures, such as magnesium sulfate, should be avoided if possible. This may cause uncontrollable diarrhea. Using magnesium sulfate may not allow you time to make it to the bathroom.  SEEK IMMEDIATE MEDICAL CARE IF:  There is bright red blood in the stool.   The constipation stays for more than 4 days.   There is belly (abdominal) or rectal pain.   You do not seem to be getting better.   You have any questions or concerns.  MAKE SURE YOU:  Understand these instructions.   Will watch your condition.   Will get help right away if you are not doing well or get worse.  Document Released: 11/14/2003 Document Re-Released: 05/12/2009 [facility] Patient Information 2011 [place], [place].Dysmenorrhea (Painful Periods) Menstrual pain is caused by the muscles of the uterus tightening (contracting) during a menstrual period. The muscles of the uterus contract due to the chemicals in the uterine lining. Primary dysmenorrhea is menstrual cramps that last a couple of days when you start having menstrual periods or soon after. This often begins after a teenager starts having her period. As a woman gets older or has a baby, the cramps will usually lesson or disappear. Secondary dysmenorrhea begins later in life, lasts longer, and the pain may be stronger than primary dysmenorrhea. The pain may start before the period and last a few days after the period. This type of dysmenorrhea is usually caused by an underlying problem such as:  The tissue lining the uterus grows outside of the uterus in other areas of the body (endometriosis).  The endometrial tissue, which normally lines the uterus,  is found in or grows into the muscular walls of the uterus (adenomyosis).   The pelvic blood vessels are engorged with blood just before the menstrual period (pelvic congestive syndrome).   Overgrowth of cells in the lining of the uterus or cervix (polyps of the uterus or cervix).   Falling down of the  uterus (prolapse) because of loose or stretched ligaments.   Depression.   Bladder problems, infection, or inflammation.  Problems with the intestine, a tumor, or irritable bowel syndrome.   Cancer of the female organs or bladder.   A severely tipped uterus.   A very tight opening or closed cervix.   Noncancerous tumors of the uterus (fibroids).   Pelvic inflammatory disease (PID).   Pelvic scarring (adhesions) from a previous surgery.   Ovarian cyst.   An intrauterine device (IUD) used for birth control.   CAUSES The cause of menstrual pain is often unknown. SYMPTOMS  Cramping or throbbing pain in your lower abdomen.   Sometimes, a woman may also experience headaches.   Lower back pain.   Feeling sick to your stomach (nausea) or vomiting.   Diarrhea.   Sweating or dizziness.  DIAGNOSIS A diagnosis is based on your history, symptoms, physical examination, diagnostic tests, or procedures. Diagnostic tests or procedures may include:  Blood tests.   An ultrasound.   An examination of the lining of the uterus (dilation and curettage, D&C).   An examination inside your abdomen or pelvis with a scope (laparoscopy).   X-rays.   CT Scan.   MRI.   An examination inside the bladder with a scope (cystoscopy).   An examination inside the intestine or stomach with a scope (colonoscopy, gastroscopy).  TREATMENT Treatment depends on the cause of the dysmenorrhea. Treatment may include:  Pain medication prescribed by your caregiver.   Birth control pills.   Hormone replacement therapy.   Nonsteroidal anti-inflammatory drugs (NSAIDs). These may help stop the production of prostaglandins.   An IUD with progesterone hormone in it.   Acupuncture.   Surgery to remove adhesions, endometriosis, ovarian cyst, or fibroids.   Removal of the uterus (hysterectomy).   Progesterone shots to stop the menstrual period.   Cutting the nerves on the sacrum that go to the  female organs (presacral neurectomy).   Electric currant to the sacral nerves (sacral nerve stimulation).   Antidepressant medication.   Psychiatric therapy, counseling, or group therapy.   Exercise and physical therapy.   Meditation and yoga therapy.  HOME CARE INSTRUCTIONS  Only take over-the-counter or prescription medicines for pain, discomfort, or fever as directed by your caregiver.   Place a heating pad or hot water bottle on your lower back or abdomen. Do not sleep with the heating pad.   Use aerobic exercises, walking, swimming, biking, and other exercises to help lessen the cramping.   Massage to the lower back or abdomen may help.   Stop smoking.   Avoid alcohol and caffeine.   Yoga, meditation, or acupuncture may help.  SEEK MEDICAL CARE IF:  The pain does not get better with medication.   You have pain with sexual intercourse.  SEEK IMMEDIATE MEDICAL CARE IF:  Your pain increases and is not controlled with medications.   You have an oral temperature above 100.5, not controlled by medicine.   You develop nausea or vomiting with your period not controlled with medication.   You have abnormal vaginal bleeding with your period.   You pass out.  MAKE SURE YOU:  Understand these instructions.   Will watch your condition.   Will get help right away if you are not doing well or get worse.  Document Released: 02/15/2005 Document Re-Released: 08/05/2009 [facility] Patient Information 2011 [place], [place].

## 2010-06-25 ENCOUNTER — Encounter: Payer: Self-pay | Admitting: Family Medicine

## 2010-06-26 ENCOUNTER — Ambulatory Visit: Payer: Medicaid Other | Admitting: Family Medicine

## 2010-07-01 ENCOUNTER — Ambulatory Visit (INDEPENDENT_AMBULATORY_CARE_PROVIDER_SITE_OTHER): Payer: Medicaid Other | Admitting: Family Medicine

## 2010-07-01 ENCOUNTER — Encounter: Payer: Self-pay | Admitting: Family Medicine

## 2010-07-01 DIAGNOSIS — R109 Unspecified abdominal pain: Secondary | ICD-10-CM

## 2010-07-01 NOTE — Patient Instructions (Signed)
Your ultrasounds are normal, no cyst on your ovaries, no gallstones You can schedule an appt with me to discuss your weight loss before June Record your menstrual cycles If you have irregular periods come back before June Work on only 2 sodas a day At least 2 meals a day Add veggies or fruit

## 2010-07-02 ENCOUNTER — Encounter: Payer: Self-pay | Admitting: Family Medicine

## 2010-07-02 NOTE — Assessment & Plan Note (Signed)
No resolved Pt will monitor her menses, this may have been a one time only change in cycle. Normal ultrasounds reassuring Will follow BTL for birth control

## 2010-07-02 NOTE — Progress Notes (Signed)
  Subjective:    Patient ID: Carol Gross, female    DOB: 1980/06/02, 30 y.o.   MRN: [id number]  HPI   Pt seen for abd pain and pelvic pain after having 2 cycles in one month. No further bleeding abd pain has resolved, occ has a sharp pain in pelvic region,denies dysuria, vaginal discharge, tolerating po. No fever, or emesis, no change in stools  Ultrasound of abdomen, pelvis -neg, no gallstones, normal kidney, no pelvic mass, no ovarian cyst Pt feels her pain may occur when she has back pain   Review of Systems per above     Objective:   Physical Exam    GEN- NAD, alert and oriented     ABD- Soft, NT.ND, NABS, obese, no masses felt, no suprapubic tenderness       Assessment & Plan:

## 2010-07-17 NOTE — Op Note (Signed)
NAMEKENSINGTON, RIOS NO.:  [id number]   MEDICAL RECORD NO.:  [id number]          PATIENT TYPE:  INP   LOCATION:  9305                          FACILITY:  [facility]   PHYSICIAN:  Ginger Carne, MD  DATE OF BIRTH:  30-Oct-1980   DATE OF PROCEDURE:  01/15/2006  DATE OF DISCHARGE:                               OPERATIVE REPORT   PREOPERATIVE DIAGNOSIS:  Sterilization.   POSTOPERATIVE DIAGNOSIS:  Sterilization.   PROCEDURE:  Pomeroy bilateral tubal ligation.   SURGEON:  Ginger Carne, MD   ASSISTANT:  None.   COMPLICATIONS:  None immediate.   ANESTHESIA:  Epidural.   SPECIMEN:  Portions of right and left tubes to pathology.   OPERATIVE FINDINGS:  Both tubes new and ovaries showed normal decidual  changes of pregnancy.  Uterus appeared normal.  The uterus also showed  normal decidual changes of pregnancy.  The tubes were identified from  their isthmus to fimbriated ends, separate and apart from their  respective round ligaments on either side.   OPERATIVE PROCEDURE:  The patient was prepped and draped in the usual  fashion and placed in supine position.  Betadine solution used for  antiseptic and the patient had voided prior to the procedure.  Afterwards, a vertical infraumbilical incision was made and the abdomen  opened.  Once again, both tubes were identified from their isthmus to  fimbriated ends, separate and apart from their respective round  ligaments.  Two to 3 cm of tube on either side were incorporated in 2-0  plain catgut suture twice.  Tubes severed above knots.  The tubes were  sent separately to pathology.  No active bleeding noted.  Tips  cauterized.  Afterwards, closure of the fascia with zero Vicryl running  suture and 4-0 Monocryl for subcuticular closure.  Instrument and sponge  count were correct.  The patient tolerated the procedure well and  returned to the post anesthesia recovery room in excellent condition.      Ginger Carne, MD  Electronically Signed     SHB/MEDQ  D:  01/15/2006  T:  01/16/2006  Job:  [id number]

## 2010-08-05 ENCOUNTER — Inpatient Hospital Stay (HOSPITAL_COMMUNITY)
Admission: AD | Admit: 2010-08-05 | Discharge: 2010-08-05 | Disposition: A | Discharge status: Home / Self Care | Payer: Medicaid Other | Source: Ambulatory Visit | Attending: Obstetrics & Gynecology | Admitting: Obstetrics & Gynecology

## 2010-08-05 DIAGNOSIS — K921 Melena: Secondary | ICD-10-CM

## 2010-08-05 DIAGNOSIS — R109 Unspecified abdominal pain: Secondary | ICD-10-CM

## 2010-08-05 LAB — WET PREP, GENITAL
Clue Cells Wet Prep HPF POC: NONE SEEN
Trich, Wet Prep: NONE SEEN
Yeast Wet Prep HPF POC: NONE SEEN

## 2010-08-05 LAB — CBC
HCT: 38.5 % (ref 36.0–46.0)
MCH: 27 pg (ref 26.0–34.0)
MCHC: 32.2 g/dL (ref 30.0–36.0)
RDW: 15.7 % — ABNORMAL HIGH (ref 11.5–15.5)

## 2010-08-05 LAB — URINALYSIS, ROUTINE W REFLEX MICROSCOPIC
Bilirubin Urine: NEGATIVE
Glucose, UA: NEGATIVE mg/dL
Ketones, ur: NEGATIVE mg/dL
Protein, ur: NEGATIVE mg/dL
Urobilinogen, UA: 0.2 mg/dL (ref 0.0–1.0)

## 2010-08-05 LAB — URINE MICROSCOPIC-ADD ON

## 2010-08-05 LAB — POCT PREGNANCY, URINE: Preg Test, Ur: NEGATIVE

## 2010-08-06 LAB — GC/CHLAMYDIA PROBE AMP, GENITAL
Chlamydia, DNA Probe: NEGATIVE
GC Probe Amp, Genital: NEGATIVE

## 2010-08-07 ENCOUNTER — Ambulatory Visit (INDEPENDENT_AMBULATORY_CARE_PROVIDER_SITE_OTHER): Payer: Medicaid Other | Admitting: Family Medicine

## 2010-08-07 ENCOUNTER — Encounter: Payer: Self-pay | Admitting: Family Medicine

## 2010-08-07 ENCOUNTER — Telehealth: Payer: Self-pay | Admitting: *Deleted

## 2010-08-07 DIAGNOSIS — R109 Unspecified abdominal pain: Secondary | ICD-10-CM

## 2010-08-07 DIAGNOSIS — H0019 Chalazion unspecified eye, unspecified eyelid: Secondary | ICD-10-CM

## 2010-08-07 DIAGNOSIS — H0012 Chalazion right lower eyelid: Secondary | ICD-10-CM

## 2010-08-07 MED ORDER — HYDROCODONE-ACETAMINOPHEN 5-325 MG PO TABS: 1.0000 | ORAL_TABLET | ORAL | Status: DC | PRN

## 2010-08-07 NOTE — Telephone Encounter (Signed)
Left message for patient to return call. Please tell her she needs to pick up contrast dye today for CT scan on Monday. If patient is unable to go today she can call [facility] [phone] to see when the latest she can pick it up.Busick, Rodena Medin

## 2010-08-07 NOTE — Patient Instructions (Signed)
Use the Vicodin only We have set up your CT scan June 11 at 1:45pm at [facility] [address] If you have  blood in stool then go to the ER We will set up both GI and eye doctor appt  I will call after the CT scan is done

## 2010-08-07 NOTE — Progress Notes (Signed)
  Subjective:    Patient ID: Carol Gross, female    DOB: Mar 28, 1980, 28 y.o.   MRN: [id number]  HPI Seen at [facility] with abd pain and blood in stool, bright red blood x 1 day only now resolved, nausea and vomiting- had 1 episode of emesis that was red but had drank Kool-aid, chills  Pt has had 8-10 stools - after episodes of cramping   Pt seen at [facility] on 6/6 given hydrocodone  Previous abd pain work up started in April- u/s abd, pelvis normal, lab work normal She is concerned she may have cancer since nothing else can be found    Chalazion- wants to have removed, bothers her with touch, no drainage from eye, vision nromal Father- Carol Gross [phone] Carol Gross- [phone] Review of Systems     Objective:   Physical Exam   GEN- NAD, alert and oriented   HEENT- MMM, non icteric, perrl EOMI, chalazion right lower lid   ABD- NABS, soft, TTP in lower quadrants, no CVA tenderness, no organomegaly, no rebound, no gaurding       Assessment & Plan:

## 2010-08-09 DIAGNOSIS — H0012 Chalazion right lower eyelid: Secondary | ICD-10-CM | POA: Insufficient documentation

## 2010-08-09 NOTE — Assessment & Plan Note (Signed)
Chronic chalazon, send to optho to have removed

## 2010-08-09 NOTE — Assessment & Plan Note (Addendum)
Pt continues to have intermittant episodes of abd pain, some with loose stools other with constipation, blood is new. Will obtain CT abd /pelvis look for signs of coliits Will refer to GI based on duration of symptoms and inability to find any specific problem Other differential IBS in female patient Stop NSAIDS Given short term vicodin refill

## 2010-08-10 ENCOUNTER — Telehealth: Payer: Self-pay | Admitting: *Deleted

## 2010-08-10 ENCOUNTER — Ambulatory Visit
Admission: RE | Admit: 2010-08-10 | Discharge: 2010-08-10 | Disposition: A | Discharge status: Home / Self Care | Payer: Medicaid Other | Source: Ambulatory Visit | Attending: Family Medicine | Admitting: Family Medicine

## 2010-08-10 NOTE — Telephone Encounter (Signed)
Spoke with Clydie Braun @ [facility] and gave precertification authorization number. -----[id number]-----

## 2010-08-12 ENCOUNTER — Ambulatory Visit
Admission: RE | Admit: 2010-08-12 | Discharge: 2010-08-12 | Disposition: A | Discharge status: Home / Self Care | Payer: Medicaid Other | Source: Ambulatory Visit | Attending: Family Medicine | Admitting: Family Medicine

## 2010-08-12 ENCOUNTER — Telehealth: Payer: Self-pay | Admitting: *Deleted

## 2010-08-12 MED ORDER — IOHEXOL 300 MG/ML  SOLN: 125.0000 mL | Freq: Once | INTRAMUSCULAR | Status: AC | PRN

## 2010-08-12 NOTE — Telephone Encounter (Signed)
Received call from [facility] stating they  cannot make appointment for patient . An appointment was scheduled for 07/23 but they have cancelled because they are not on call this month. Please call [facility]. Message sent to [facility].

## 2010-08-13 ENCOUNTER — Telehealth: Payer: Self-pay | Admitting: *Deleted

## 2010-08-13 NOTE — Telephone Encounter (Signed)
Left message for patient to return call. Please tell patient she has appointment with Dr [facility] on 08/27/10 at 10 am. They are located on [address], [address]. Phone [phone].  Also she has appointment with [facility] on 08/28/10 at 11:15 am. They are also located on [address]. Phone [phone].  If patient is unable to keep either appointment she will need to call at least 24 hours in advance to reschedule.Ellary Casamento, Rodena Medin

## 2010-08-14 ENCOUNTER — Telehealth: Payer: Self-pay | Admitting: Family Medicine

## 2010-08-14 NOTE — Telephone Encounter (Addendum)
I called and left a message with pt father as her phone is disconnected, she gave me the number. She will call the clinic back Please give her the dates of her specialist appointments, both Gastroenterology and Opthomology Also let her know- her CT scan was normal with the exception of evidence of fatty liver- which means she needs to watch her fat intake and fried foods. The next doctor will check her cholesterol at her physical in the next few months. She also has some small outpouching called diverticula- she needs to make sure she gets plenty of water and increase her fiber intake Right now I do not see a reason for her abdominal pain, but I want her to go see the gastroenterology as planned

## 2010-08-18 ENCOUNTER — Telehealth: Payer: Self-pay | Admitting: Family Medicine

## 2010-08-18 NOTE — Telephone Encounter (Signed)
Can we make sure she received this message. I also dont wont her to miss the referral appt made Father- Jyoti Harju [phone]  Marcia Brash- [phone] - Clearence Cheek

## 2010-08-19 ENCOUNTER — Encounter: Payer: Self-pay | Admitting: Family Medicine

## 2010-08-27 ENCOUNTER — Telehealth: Payer: Self-pay | Admitting: Family Medicine

## 2010-08-27 NOTE — Telephone Encounter (Signed)
MD office is calling to let us know that the patient missed the appointment.

## 2010-08-27 NOTE — Telephone Encounter (Signed)
Patient missed referral appointment

## 2010-09-14 ENCOUNTER — Telehealth: Payer: Self-pay | Admitting: *Deleted

## 2010-09-14 NOTE — Telephone Encounter (Signed)
Received call from  [facility] stating patient " No Showed" for her appointment today with Dr. Evette Cristal.

## 2010-09-21 ENCOUNTER — Ambulatory Visit: Payer: Medicaid Other | Admitting: Gastroenterology

## 2010-11-19 LAB — URINE CULTURE

## 2010-11-19 LAB — POCT URINALYSIS DIP (DEVICE)
Glucose, UA: NEGATIVE
Operator id: 200941
Specific Gravity, Urine: >=1.03
Urobilinogen, UA: 1

## 2010-12-17 ENCOUNTER — Ambulatory Visit: Payer: Medicaid Other

## 2010-12-17 ENCOUNTER — Ambulatory Visit (INDEPENDENT_AMBULATORY_CARE_PROVIDER_SITE_OTHER): Payer: Medicaid Other | Admitting: Family Medicine

## 2010-12-17 ENCOUNTER — Encounter: Payer: Self-pay | Admitting: Family Medicine

## 2010-12-17 DIAGNOSIS — Z720 Tobacco use: Secondary | ICD-10-CM

## 2010-12-17 DIAGNOSIS — E669 Obesity, unspecified: Secondary | ICD-10-CM

## 2010-12-17 DIAGNOSIS — F17200 Nicotine dependence, unspecified, uncomplicated: Secondary | ICD-10-CM

## 2010-12-17 DIAGNOSIS — I10 Essential (primary) hypertension: Secondary | ICD-10-CM

## 2010-12-17 LAB — POCT GLYCOSYLATED HEMOGLOBIN (HGB A1C): Hemoglobin A1C: 4.9

## 2010-12-17 LAB — COMPREHENSIVE METABOLIC PANEL
Albumin: 4.1 g/dL (ref 3.5–5.2)
BUN: 10 mg/dL (ref 6–23)
CO2: 24 mEq/L (ref 19–32)
Glucose, Bld: 77 mg/dL (ref 70–99)
Sodium: 141 mEq/L (ref 135–145)
Total Bilirubin: 0.2 mg/dL — ABNORMAL LOW (ref 0.3–1.2)
Total Protein: 6.6 g/dL (ref 6.0–8.3)

## 2010-12-17 LAB — CBC
HCT: 36.6 % (ref 36.0–46.0)
Hemoglobin: 11.5 g/dL — ABNORMAL LOW (ref 12.0–15.0)
MCH: 26.5 pg (ref 26.0–34.0)
MCHC: 31.4 g/dL (ref 30.0–36.0)
RBC: 4.34 MIL/uL (ref 3.87–5.11)

## 2010-12-17 LAB — TSH: TSH: 0.707 u[IU]/mL (ref 0.350–4.500)

## 2010-12-17 MED ORDER — TRIAMTERENE-HCTZ 37.5-25 MG PO TABS: 0.5000 | ORAL_TABLET | Freq: Every day | ORAL | Status: DC

## 2010-12-17 MED ORDER — NICOTINE POLACRILEX 2 MG MT GUM: 2.0000 mg | CHEWING_GUM | OROMUCOSAL | Status: AC | PRN

## 2010-12-17 MED ORDER — NICOTINE 7 MG/24HR TD PT24: 1.0000 | MEDICATED_PATCH | TRANSDERMAL | Status: AC

## 2010-12-17 NOTE — Progress Notes (Signed)
  Subjective:    Patient ID: Carol Gross, female    DOB: 1980/11/08, 30 y.o.   MRN: [id number]  HPI This is an evaluation for hypertension. Patient was seen early in the week for dental procedure were patient is noted to have systolic pressures into the 160s as well as diastolic pressures into the 100s. Patient referred back to [facility] for evaluation of high blood pressure. Patient states that she has been  having worsening headaches and intermittent blurred vision over the last 2-3 weeks. Pt also with high dose NSAID use on a regular basis. Patient reports exceedingly high salt intake on a daily basis that her family tried to address with her daily. No lower extremity edema, chest pain, shortness of breath.  Review of Systems See HPI     Objective:   Physical Exam Gen: up in chair, NAD, obese HEENT: NCAT, EOMI, TMs clear bilaterally, normal funduscopic exam  CV: RRR, no murmurs auscultated PULM: CTAB, no wheezes, rales, rhoncii ABD: S/NT/+ bowel sounds  EXT: 2+ peripheral pulses   SKIN: mild acanthosis on posterior neck Assessment & Plan:

## 2010-12-17 NOTE — Assessment & Plan Note (Signed)
Pt ready to quit. Will start on nicotine patch and nicorette gum with planned follow up with PCP in 1 month.

## 2010-12-17 NOTE — Patient Instructions (Signed)
It was good to see you again I'm starting you some medication for your blood pressure I am also checking some baseline lab work Please avoid any salty foods I am prescribing nicotine patches and nicotine gum for your smoking Avoid any medications like ibuprofen, Motrin, naproxen, Aleve as this can significantly raise her blood pressure Followup with Dr. Clinton Sawyer in one to 2 weeks  Hypertension As your heart beats, it forces blood through your arteries. This force is your blood pressure. If the pressure is too high, it is called hypertension (HTN) or high blood pressure. HTN is dangerous because you may have it and not know it. High blood pressure may mean that your heart has to work harder to pump blood. Your arteries may be narrow or stiff. The extra work puts you at risk for heart disease, stroke, and other problems.  Blood pressure consists of two numbers, a higher number over a lower, 110/72, for example. It is stated as "110 over 72." The ideal is below 120 for the top number (systolic) and under 80 for the bottom (diastolic). Write down your blood pressure today. You should pay close attention to your blood pressure if you have certain conditions such as:  Heart failure.   Prior heart attack.   Diabetes   Chronic kidney disease.   Prior stroke.   Multiple risk factors for heart disease.  To see if you have HTN, your blood pressure should be measured while you are seated with your arm held at the level of the heart. It should be measured at least twice. A one-time elevated blood pressure reading (especially in the Emergency Department) does not mean that you need treatment. There may be conditions in which the blood pressure is different between your right and left arms. It is important to see your caregiver soon for a recheck. Most people have essential hypertension which means that there is not a specific cause. This type of high blood pressure may be lowered by changing lifestyle  factors such as:  Stress.   Smoking.   Lack of exercise.   Excessive weight.   Drug/tobacco/alcohol use.   Eating less salt.  Most people do not have symptoms from high blood pressure until it has caused damage to the body. Effective treatment can often prevent, delay or reduce that damage. TREATMENT  When a cause has been identified, treatment for high blood pressure is directed at the cause. There are a large number of medications to treat HTN. These fall into several categories, and your caregiver will help you select the medicines that are best for you. Medications may have side effects. You should review side effects with your caregiver. If your blood pressure stays high after you have made lifestyle changes or started on medicines,   Your medication(s) may need to be changed.   Other problems may need to be addressed.   Be certain you understand your prescriptions, and know how and when to take your medicine.   Be sure to follow up with your caregiver within the time frame advised (usually within two weeks) to have your blood pressure rechecked and to review your medications.   If you are taking more than one medicine to lower your blood pressure, make sure you know how and at what times they should be taken. Taking two medicines at the same time can result in blood pressure that is too low.  SEEK IMMEDIATE MEDICAL CARE IF:  You develop a severe headache, blurred or changing vision, or  confusion.   You have unusual weakness or numbness, or a faint feeling.   You have severe chest or abdominal pain, vomiting, or breathing problems.  MAKE SURE YOU:   Understand these instructions.   Will watch your condition.   Will get help right away if you are not doing well or get worse.  Document Released: 02/15/2005 Document Revised: 10/28/2010 Document Reviewed: 10/06/2007 [facility] Patient Information 2012 [place], [place].          2 Gram Low Sodium Diet A 2 gram sodium diet  restricts the amount of sodium in the diet to no more than 2 g or 2000 mg daily. Limiting the amount of sodium is often used to help lower blood pressure. It is important if you have heart, liver, or kidney problems. Many foods contain sodium for flavor and sometimes as a preservative. When the amount of sodium in a diet needs to be low, it is important to know what to look for when choosing foods and drinks. The following includes some information and guidelines to help make it easier for you to adapt to a low sodium diet. QUICK TIPS  Do not add salt to food.   Avoid convenience items and fast food.   Choose unsalted snack foods.   Buy lower sodium products, often labeled as "lower sodium" or "no salt added."   Check food labels to learn how much sodium is in 1 serving.   When eating at a restaurant, ask that your food be prepared with less salt or none, if possible.  READING FOOD LABELS FOR SODIUM INFORMATION The nutrition facts label is a good place to find how much sodium is in foods. Look for products with no more than 500 to 600 mg of sodium per meal and no more than 150 mg per serving. Remember that 2 g = 2000 mg. The food label may also list foods as:  Sodium-free: Less than 5 mg in a serving.   Very low sodium: 35 mg or less in a serving.   Low-sodium: 140 mg or less in a serving.   Light in sodium: 50% less sodium in a serving. For example, if a food that usually has 300 mg of sodium is changed to become light in sodium, it will have 150 mg of sodium.   Reduced sodium: 25% less sodium in a serving. For example, if a food that usually has 400 mg of sodium is changed to reduced sodium, it will have 300 mg of sodium.  CHOOSING FOODS Grains  Avoid: Salted crackers and snack items. Some cereals, including instant hot cereals. Bread stuffing and biscuit mixes. Seasoned rice or pasta mixes.   Choose: Unsalted snack items. Low-sodium cereals, oats, puffed wheat and rice, shredded  wheat. English muffins and bread. Pasta.  Meats  Avoid: Salted, canned, smoked, spiced, pickled meats, including fish and poultry. Bacon, ham, sausage, cold cuts, hot dogs, anchovies.   Choose: Low-sodium canned tuna and salmon. Fresh or frozen meat, poultry, and fish.  Dairy  Avoid: Processed cheese and spreads. Cottage cheese. Buttermilk and condensed milk. Regular cheese.   Choose: Milk. Low-sodium cottage cheese. Yogurt. Sour cream. Low-sodium cheese.  Fruits and Vegetables  Avoid: Regular canned vegetables. Regular canned tomato sauce and paste. Frozen vegetables in sauces. Olives. Rosita Fire. Relishes. Sauerkraut.   Choose: Low-sodium canned vegetables. Low-sodium tomato sauce and paste. Frozen or fresh vegetables. Fresh and frozen fruit.  Condiments  Avoid: Canned and packaged gravies. Worcestershire sauce. Tartar sauce. Barbecue sauce. Soy sauce. Steak  sauce. Ketchup. Onion, garlic, and table salt. Meat flavorings and tenderizers.   Choose: Fresh and dried herbs and spices. Low-sodium varieties of mustard and ketchup. Lemon juice. Tabasco sauce. Horseradish.  SAMPLE 2 GRAM SODIUM MEAL PLAN Breakfast / Sodium (mg)  1 cup low-fat milk / 143 mg   2 slices whole-wheat toast / 270 mg   1 tbs heart-healthy margarine / 153 mg   1 hard-boiled egg / 139 mg   1 small orange / 0 mg  Lunch / Sodium (mg)  1 cup raw carrots / 76 mg    cup hummus / 298 mg   1 cup low-fat milk / 143 mg    cup red grapes / 2 mg   1 whole-wheat pita bread / 356 mg  Dinner / Sodium (mg)  1 cup whole-wheat pasta / 2 mg   1 cup low-sodium tomato sauce / 73 mg   3 oz lean ground beef / 57 mg   1 small side salad (1 cup raw spinach leaves,  cup cucumber,  cup yellow bell pepper) with 1 tsp olive oil and 1 tsp red wine vinegar / 25 mg  Snack / Sodium (mg)  1 container low-fat vanilla yogurt / 107 mg   3 graham cracker squares / 127 mg  Nutrient Analysis  Calories: 2033   Protein: 77 g     Carbohydrate: 282 g   Fat: 72 g   Sodium: 1971 mg  Document Released: 02/15/2005 Document Revised: 10/28/2010 Document Reviewed: 05/19/2009 [facility] Patient Information 2012 [place], [place].

## 2010-12-17 NOTE — Assessment & Plan Note (Addendum)
Will start pt on maxzide for BP control. Stressed importance of low salt diet. Stressed NSAID avoidance. Handout given. Will also check Cr and K. Given obesity and acanthosis, will also check TSH, A1C and FLP for general risk stratification.

## 2010-12-25 ENCOUNTER — Encounter: Payer: Self-pay | Admitting: Family Medicine

## 2011-01-29 ENCOUNTER — Ambulatory Visit (INDEPENDENT_AMBULATORY_CARE_PROVIDER_SITE_OTHER): Payer: Medicaid Other | Admitting: Family Medicine

## 2011-01-29 ENCOUNTER — Ambulatory Visit: Payer: Medicaid Other | Admitting: Family Medicine

## 2011-01-29 ENCOUNTER — Encounter: Payer: Self-pay | Admitting: Family Medicine

## 2011-01-29 DIAGNOSIS — I10 Essential (primary) hypertension: Secondary | ICD-10-CM

## 2011-01-29 DIAGNOSIS — R109 Unspecified abdominal pain: Secondary | ICD-10-CM

## 2011-01-29 MED ORDER — NORGESTIMATE-ETH ESTRADIOL 0.25-35 MG-MCG PO TABS: 1.0000 | ORAL_TABLET | Freq: Every day | ORAL | Status: DC

## 2011-01-29 MED ORDER — IBUPROFEN 600 MG PO TABS: 600.0000 mg | ORAL_TABLET | Freq: Three times a day (TID) | ORAL | Status: DC

## 2011-01-29 NOTE — Assessment & Plan Note (Signed)
Wonder if she has endometriosis, has many of symptoms.  Refer to gynecology as this is such a long-term problem for her.   Creatinine has been normal for her -- I wonder if she actually has HTN, found to have elevated BP's at dentist appt when she was experiencing severe tooth pain, diagnosed after only 1 visit here.   As Ibuprofen has helped in past with pain and can also control bleeding, recommended she can use this. Also start Sprintec today for bleeding control and hopeful regulation of menses, may help symptoms if she is having endometrial symptoms.   Patient has history of BTL so contraception will be for pain control and bleeding.   Do not believe ectopic likely due to benign abd exam today and long-term course of abdominal pain.   FU in 2-3 weeks after she is seen in gynecology.

## 2011-01-29 NOTE — Progress Notes (Signed)
  Subjective:    Patient ID: Carol Gross, female    DOB: 1980-09-02, 30 y.o.   MRN: [id number]  HPI 1.  Increased pain with menses:  Present with every menses for past several months.  LLQ and LUQ pain that starts a day or two prior to menses.  Becomes nauseous, vomits once or twice, then resolves.  Menses Q3 weeks, was regular every month.  Lasts 3-4 days, then remits.  Hx/o BTL.  Has had extensive imaging workup in past year or so (CT scan, pelvic US) and essentially negative.  Patient growing frustated with pain.  States Ibuprofen helped in past but was told not to take any longer b/c she has diagnosis of HTN.  No lightheadedness.  No fevers or chills.  No dysuria or vaginal discharge.  2. Hypertension:  Recently diagnosed.  Now on Maxzide.  Not checking it regularly.  No HA, CP, dizziness, shortness of breath, palpitations, or LE swelling.   BP Readings from Last 3 Encounters:  01/29/11 117/79  12/17/10 139/82  08/07/10 120/70     Review of Systems See HPI above for review of systems.       Objective:   Physical Exam Gen:  Alert, cooperative patient who appears stated age in no acute distress.  Vital signs reviewed. Cardiac:  Regular rate and rhythm without murmur auscultated.  Good S1/S2. Abd:  Soft/nondistended/nontender.  Good bowel sounds throughout all four quadrants.  No masses noted.  Gyn deferred at patient request as she is on menses        Assessment & Plan:

## 2011-01-29 NOTE — Patient Instructions (Signed)
Take the birth control everyday for at least a couple months. We'll refer you to Gynecology. Take the Ibuprofen.  Take a record of your blood pressure at home.

## 2011-01-29 NOTE — Assessment & Plan Note (Addendum)
Unclear about diagnosis.   Would recommend serial blood pressures in 30 yo with no prior history. Instructed patient regarding blood pressure journal, she is to bring this next visit.

## 2011-02-01 ENCOUNTER — Telehealth: Payer: Self-pay | Admitting: *Deleted

## 2011-02-01 NOTE — Telephone Encounter (Signed)
Called pt and informed of appt with [facility] on 12.26.2012 @ 200 pm. Told pt that she will need to call their office to cancel/reschedule her appt if she cannot keep it 24 hours in advance or she will be charged a fee. Pt agreed and understood.Loralee Pacas [place]

## 2011-04-02 ENCOUNTER — Other Ambulatory Visit: Payer: Self-pay | Admitting: Family Medicine

## 2011-04-26 ENCOUNTER — Ambulatory Visit (INDEPENDENT_AMBULATORY_CARE_PROVIDER_SITE_OTHER): Payer: Medicaid Other | Admitting: Family Medicine

## 2011-04-26 ENCOUNTER — Encounter: Payer: Self-pay | Admitting: Family Medicine

## 2011-04-26 DIAGNOSIS — Z720 Tobacco use: Secondary | ICD-10-CM

## 2011-04-26 DIAGNOSIS — R109 Unspecified abdominal pain: Secondary | ICD-10-CM

## 2011-04-26 DIAGNOSIS — R229 Localized swelling, mass and lump, unspecified: Secondary | ICD-10-CM

## 2011-04-26 DIAGNOSIS — F17200 Nicotine dependence, unspecified, uncomplicated: Secondary | ICD-10-CM

## 2011-04-26 LAB — POCT URINALYSIS DIPSTICK
Bilirubin, UA: NEGATIVE
Ketones, UA: NEGATIVE
Protein, UA: NEGATIVE
pH, UA: 6

## 2011-04-26 LAB — POCT H PYLORI SCREEN: H Pylori Screen, POC: NEGATIVE

## 2011-04-26 LAB — POCT UA - MICROSCOPIC ONLY

## 2011-04-26 NOTE — Patient Instructions (Addendum)
Dear Mrs. Hosman,   Thank you for coming to clinic today. Please read below regarding the issues that we discussed.   1. Abdominal Pain - Please stop ibuprofen, start tylenol as needed, take Tums BID. I will inform you of the results of your blood work.   2. Arm Nodule - This is likely a fibroma or lipoma. We will refer you to a general surgeon if you would like it removed.    Please follow up in clinic in 2 weeks . Please call earlier if you have any questions or concerns.   Sincerely,   Dr. Clinton Sawyer

## 2011-04-27 LAB — C-REACTIVE PROTEIN: CRP: 0.47 mg/dL (ref <0.60)

## 2011-05-03 DIAGNOSIS — Z720 Tobacco use: Secondary | ICD-10-CM | POA: Insufficient documentation

## 2011-05-03 DIAGNOSIS — R229 Localized swelling, mass and lump, unspecified: Secondary | ICD-10-CM | POA: Insufficient documentation

## 2011-05-03 NOTE — Assessment & Plan Note (Signed)
This obviously has an extremely broad differential, but has been through thorough work-up that was negative for any acute processes. My current differential includes endometriosis, IBS, Inflammatory bowel disease, gastric ulcer and somatoform disorder. A less likely possibility is nephrolithiasis. Regardless, this of the etiology, this could be exacerbated by chronic use of high dose NSAIDS.  - GYN issues - need to reschedule her GYN appointment - IBS - diagnosis of exclusion, address later - IBD - get ESR, CRP, and CBC with diff to look inflammatory proces - Ulcer - test of H. Pylori antibody - Nephrolithias- UA to assess for hematuria - Somatization Disorder - given her age < 22, multiple systems with pain including back, GYN, abdominal, and headache, we all as a pseudoneurologic complaint she meets criteria, so it must be considered and introduced to her as a possible explanation for all of her pain; however, I'm certainly hesitant to diagnose this right now after my first evlauation - NSAID use - this could contribute to gastritis, so it should be stopped in favor of tylenol - Use OTC pepto-bismol for sx of dyspepsia  -F/u in 2 weeks

## 2011-05-03 NOTE — Progress Notes (Signed)
  Subjective:    Patient ID: Carol Gross, female    DOB: [birth year], 31 y.o.   MRN: [id number]  HPI Two chief complaints:  1. "Knot" on right arm - 1 nodule on right medial forearm, noticed 6 months ago, firm, not enlarging, beneath the skin, painful when she strikes that part of arm on table or arm rest; she also states that she has a similar nodule on left anterior leg  2. Abdominal Pain - started 5 years ago after birth of last daughter - vaginal with subsequent bilateral tubal ligation; has had extensive work-up including pelvic, transvaginal u/s, abdominal u/s and abdominal CT in 2012 which were all non-diagnostic. She was last seen in the office on 01/29/11 by Dr. Gwendolyn Grant who referred her to OB-GYN for further evaluation, but she missed the appointment.   Her symptoms today include fullness in her upper GI tract, sensation that she needs to burp, minimal food regurgitation after meals; decreased appetite present; also has persistent left flank pain that she states radiates to her abdomen; this flank pain is present intermittently throughout every day and last up to one hour. It is not related to position, activity, or diet that she can discern; for her pain she takes up to 4 600mg  ibuprofen daily; she has never tried OTC medication for dyspepsia such as pepto-bismol or antacids such as tums; She denies  a PMH of GERD or kidney stones; denies nausea, vomiting, constipation, but says she has diarrhea in the mornings;   Review of Systems Positive: nodule on right arm; bilateral back pain; severe menstrual pain that prevents her from walking; abdominal fullness; early satiety; left flank pain; diarrhea in AM; headaches Negative: constipation, nausea, vomiting, blood in stool    Objective:   Physical Exam BP 127/83  Pulse 71  Ht 5\' 6"  (1.676 m)  Wt 215 lb 14.4 oz (97.932 kg)  BMI 34.85 kg/m2  LMP 03/26/2011 Gen: alert, oriented, pleasant, non-distressed, non-ill appearing, obese body  habitus Right Arm: small, 0.5 cm firm subcutaneous nodule on medial side Left left: no palpable nodule on anterior leg  Abdomen: protuberant abdomen; NABS; non-distended; diffuse tenderness in all quadrants, no palpable masses or HSM Back: bilateral CVA tenderness, tenderness to palpalpation on lumbar spine and posterior superior iliac spine bilaterally; no bony deformities, bony landmarks not visible 2/2 adipose tissue      Assessment & Plan:  Carol Gross presents today for evaluation of chronic abdominal pain of undetermined origin as well as a painful subcutaneous nodule on the right medial forearm.

## 2011-05-03 NOTE — Assessment & Plan Note (Signed)
The differential for this is broad, but likely a benign tumor such as fibroma or lipoma. No evidence of malignancy process. I suggested conservative management of observation. However, it is bothering the patient, so I will refer her to a general surgeon for evaluation and management.

## 2011-05-04 ENCOUNTER — Telehealth: Payer: Self-pay | Admitting: Family Medicine

## 2011-05-04 NOTE — Telephone Encounter (Signed)
Entered in error

## 2011-05-19 ENCOUNTER — Ambulatory Visit (INDEPENDENT_AMBULATORY_CARE_PROVIDER_SITE_OTHER): Payer: Medicaid Other | Admitting: Surgery

## 2011-06-16 ENCOUNTER — Ambulatory Visit (INDEPENDENT_AMBULATORY_CARE_PROVIDER_SITE_OTHER): Payer: Medicaid Other | Admitting: Surgery

## 2011-06-23 ENCOUNTER — Encounter (INDEPENDENT_AMBULATORY_CARE_PROVIDER_SITE_OTHER): Payer: Self-pay | Admitting: Surgery

## 2011-08-17 ENCOUNTER — Ambulatory Visit (INDEPENDENT_AMBULATORY_CARE_PROVIDER_SITE_OTHER): Payer: Self-pay | Admitting: Family Medicine

## 2011-08-17 ENCOUNTER — Encounter: Payer: Self-pay | Admitting: Family Medicine

## 2011-08-17 VITALS — BP 141/99 | HR 80 | Temp 98.3°F | Ht 66.0 in | Wt 214.0 lb

## 2011-08-17 DIAGNOSIS — F17200 Nicotine dependence, unspecified, uncomplicated: Secondary | ICD-10-CM

## 2011-08-17 DIAGNOSIS — Z720 Tobacco use: Secondary | ICD-10-CM

## 2011-08-17 DIAGNOSIS — E669 Obesity, unspecified: Secondary | ICD-10-CM

## 2011-08-17 DIAGNOSIS — M549 Dorsalgia, unspecified: Secondary | ICD-10-CM

## 2011-08-17 DIAGNOSIS — K644 Residual hemorrhoidal skin tags: Secondary | ICD-10-CM

## 2011-08-17 MED ORDER — CYCLOBENZAPRINE HCL 5 MG PO TABS: 5.0000 mg | ORAL_TABLET | Freq: Every evening | ORAL | Status: DC | PRN

## 2011-08-17 NOTE — Progress Notes (Signed)
  Subjective:    Patient ID: Carol Gross, female    DOB: January 30, 1981, 31 y.o.   MRN: [id number]  HPI  Ms. Barna comes in for back pain. This is a chronic problem.  She says they have sent her for scans and to IR for injections, and nothing has helped. She was also given a home exercise program at one point, but she says the stretches hurt so she stopped.  She says they stopped her ibuprofen because of her HTN, but she has been taking it over the counter anyway.  She denies any injury, denies incontinence or weakness.  She complains of constant pain in her low back, both achy and sharp, in the middle and on the sides.    Obesity- patient has lost about 10 lbs in the past year.  She says she has tried to eat healthier.   Tobacco abuse: smoking about 1/2 ppd, trying to cut down, but not ready to quit.    Hemorrhoid- patient says she jumped in the pool on Sunday and had some pain in her bottom, when she went to the bathroom she wiped and noticed a hemorrhoid.  It is mildly uncomfortable.   Past Medical History  Diagnosis Date  . Depression   . Carpal tunnel syndrome   . Back pain     sees ortho   History  Substance Use Topics  . Smoking status: Current Everyday Smoker -- 0.3 packs/day    Types: Cigarettes  . Smokeless tobacco: Never Used   Comment: decreased smoking  . Alcohol Use: Not on file    Review of Systems Pertinent items in HPI.     Objective:   Physical Exam BP 141/99  Pulse 80  Temp 98.3 F (36.8 C) (Oral)  Ht 5\' 6"  (1.676 m)  Wt 214 lb (97.07 kg)  BMI 34.54 kg/m2 General appearance: alert, cooperative and no distress Back: symmetric, no curvature. ROM normal. No CVA tenderness. Strength in LE is 5/5 in symmetric in all muscle groups.  DTR's are symmetric, and sensation is in tact.  Rectum: 1x.5 cm external hemorrhoid, not thrombosed.        Assessment & Plan:

## 2011-08-17 NOTE — Assessment & Plan Note (Signed)
Not bleeding or thrombosed.  Advised conservative management with OTC cream.

## 2011-08-17 NOTE — Assessment & Plan Note (Signed)
Discussed obesity as common cause of back pain.  Congratulated her on weight loss- advised to keep it up, and let her know she can come in to [facility] for nutrition counseling if she wants help with more lifestyle changes.

## 2011-08-17 NOTE — Assessment & Plan Note (Signed)
Discussed correlation of smoking and back pain (degenerative discs) offered counseling to quit.  Patient says she will think about it.

## 2011-08-17 NOTE — Patient Instructions (Signed)
It was nice to meet you.  Please try taking two extra strength tylenol (two 500 mg tablets) three times a day.  You can use ibuprofen occasionally as needed.  You can also use the Flexeril at bedtime as needed.  Please try to stay as active as possible as this helps low back pain.  I am making a referral to Physical Therapy for your back pain. The will call you to schedule the appointment.   For your hemorrhoid, please try an over the counter hemorrhoid cream.

## 2011-08-17 NOTE — Assessment & Plan Note (Signed)
Chronic problem, no acute changes.  Rx for flexeril, discussed scheduling tylenol, using ibuprofen prn.  Also talked about side effects of ibuprofen.  Advised she see physical therapy- she agrees to try this. Will make referral.

## 2011-08-22 ENCOUNTER — Emergency Department (HOSPITAL_COMMUNITY)
Admission: EM | Admit: 2011-08-22 | Discharge: 2011-08-22 | Disposition: A | Discharge status: Home / Self Care | Payer: Medicaid Other | Attending: Emergency Medicine | Admitting: Emergency Medicine

## 2011-08-22 ENCOUNTER — Encounter (HOSPITAL_COMMUNITY): Payer: Self-pay | Admitting: *Deleted

## 2011-08-22 DIAGNOSIS — M545 Low back pain, unspecified: Secondary | ICD-10-CM | POA: Insufficient documentation

## 2011-08-22 DIAGNOSIS — F17200 Nicotine dependence, unspecified, uncomplicated: Secondary | ICD-10-CM | POA: Insufficient documentation

## 2011-08-22 DIAGNOSIS — Z79899 Other long term (current) drug therapy: Secondary | ICD-10-CM | POA: Insufficient documentation

## 2011-08-22 DIAGNOSIS — G8929 Other chronic pain: Secondary | ICD-10-CM | POA: Insufficient documentation

## 2011-08-22 MED ORDER — HYDROCODONE-ACETAMINOPHEN 5-325 MG PO TABS: 1.0000 | ORAL_TABLET | ORAL | Status: DC | PRN

## 2011-08-22 MED ORDER — DIAZEPAM 5 MG PO TABS: 5.0000 mg | ORAL_TABLET | Freq: Four times a day (QID) | ORAL | Status: AC | PRN

## 2011-08-22 MED ORDER — HYDROMORPHONE HCL PF 1 MG/ML IJ SOLN
1.0000 mg | Freq: Once | INTRAMUSCULAR | Status: AC
  Administered 2011-08-22: 1 mg via INTRAMUSCULAR
  Filled 2011-08-22: qty 1

## 2011-08-22 MED ORDER — DIAZEPAM 5 MG/ML IJ SOLN
5.0000 mg | Freq: Once | INTRAMUSCULAR | Status: AC
  Administered 2011-08-22: 5 mg via INTRAMUSCULAR
  Filled 2011-08-22: qty 2

## 2011-08-22 MED ORDER — PREDNISONE 20 MG PO TABS
60.0000 mg | ORAL_TABLET | Freq: Once | ORAL | Status: AC
  Administered 2011-08-22: 60 mg via ORAL
  Filled 2011-08-22: qty 3

## 2011-08-22 NOTE — ED Provider Notes (Signed)
History     CSN: [id number]  Arrival date & time 08/22/11  4098   First MD Initiated Contact with Patient 08/22/11 1914      Chief Complaint  Patient presents with  . Back Pain    (Consider location/radiation/quality/duration/timing/severity/associated sxs/prior treatment) HPI Comments: Patient here with worsening of chronic lower back pain - she reports that the pain has been going on for the past 4 years - she states that she has had MRI's injections, etc.  She states that they do not know what is going on - she saw her PCP at [facility] 2 days ago and was given RX for ibuprofen and flexeril which she was unable to fill because of insurance issues.  She reports now with worsening pain with with movement, reports that pain is 10/10 and shooting down her left leg.  She denies weakness, numbness, but reports occasional episodic tingling in bilateral feet but none now - she denies saddle anesthesia, loss of control of bowels or bladder or urinary retention.  Patient is a 31 y.o. female presenting with back pain. The history is provided by the patient. No language interpreter was used.  Back Pain  This is a chronic problem. The current episode started more than 1 week ago. The problem occurs constantly. The problem has not changed since onset.The pain is associated with no known injury. The pain is present in the lumbar spine. The quality of the pain is described as stabbing and shooting. The pain radiates to the left thigh. The pain is at a severity of 10/10. The pain is severe. The symptoms are aggravated by bending, twisting and certain positions. The pain is the same all the time. Stiffness is present all day. Associated symptoms include leg pain. Pertinent negatives include no chest pain, no fever, no numbness, no weight loss, no headaches, no abdominal pain, no abdominal swelling, no bowel incontinence, no perianal numbness, no bladder incontinence, no dysuria, no pelvic pain, no paresthesias, no  paresis, no tingling and no weakness. She has tried nothing for the symptoms. The treatment provided no relief. Risk factors include obesity.    Past Medical History  Diagnosis Date  . Depression   . Carpal tunnel syndrome   . Back pain     sees ortho    History reviewed. No pertinent past surgical history.  History reviewed. No pertinent family history.  History  Substance Use Topics  . Smoking status: Current Everyday Smoker -- 0.3 packs/day    Types: Cigarettes  . Smokeless tobacco: Never Used   Comment: decreased smoking  . Alcohol Use: Not on file    OB History    Grav Para Term Preterm Abortions TAB SAB Ect Mult Living                  Review of Systems  Constitutional: Negative for fever and weight loss.  HENT: Negative for neck pain.   Eyes: Negative for pain.  Respiratory: Negative for shortness of breath.   Cardiovascular: Negative for chest pain.  Gastrointestinal: Negative for abdominal pain and bowel incontinence.  Genitourinary: Negative for bladder incontinence, dysuria and pelvic pain.  Musculoskeletal: Positive for back pain and gait problem.  Skin: Negative for wound.  Neurological: Negative for tingling, weakness, numbness, headaches and paresthesias.  All other systems reviewed and are negative.    Allergies  Review of patient's allergies indicates no known allergies.  Home Medications   Current Outpatient Rx  Name Route Sig Dispense Refill  . TRIAMTERENE-HCTZ 37.5-25  MG PO TABS Oral Take 0.5 tablets by mouth daily.    Marland Kitchen VALACYCLOVIR HCL 1 G PO TABS Oral Take 1,000 mg by mouth as directed. Take for outbreak    . CYCLOBENZAPRINE HCL 5 MG PO TABS Oral Take 5 mg by mouth at bedtime as needed. for back pain    . IBUPROFEN 600 MG PO TABS Oral Take 600 mg by mouth 3 (three) times daily. With food as needed for pain      BP 124/86  Pulse 59  Temp 98.1 F (36.7 C) (Oral)  Resp 16  SpO2 100%  Physical Exam  Nursing note and vitals  reviewed. Constitutional: She is oriented to person, place, and time. She appears well-developed and well-nourished. She appears distressed.       Uncomfortable appearing   HENT:  Head: Normocephalic and atraumatic.  Right Ear: External ear normal.  Left Ear: External ear normal.  Nose: Nose normal.  Mouth/Throat: Oropharynx is clear and moist. No oropharyngeal exudate.  Eyes: Conjunctivae are normal. Pupils are equal, round, and reactive to light. No scleral icterus.  Neck: Normal range of motion. Neck supple.  Cardiovascular: Normal rate, regular rhythm and normal heart sounds.  Exam reveals no gallop and no friction rub.   No murmur heard. Pulmonary/Chest: Effort normal and breath sounds normal. No respiratory distress. She has no wheezes. She has no rales. She exhibits no tenderness.  Abdominal: Soft. Bowel sounds are normal. She exhibits no distension. There is no tenderness.  Musculoskeletal:       Lumbar back: She exhibits decreased range of motion, tenderness and bony tenderness.       Pain with movement  Lymphadenopathy:    She has no cervical adenopathy.  Neurological: She is alert and oriented to person, place, and time. She has normal reflexes. No cranial nerve deficit. She exhibits normal muscle tone. Coordination normal.  Skin: Skin is warm and dry. No rash noted. No erythema. No pallor.  Psychiatric: She has a normal mood and affect. Her behavior is normal. Judgment and thought content normal.    ED Course  Procedures (including critical care time)  Labs Reviewed - No data to display No results found.   Chronic lower back pain    MDM  Patient reports no improvement in pain after dilaudid and valium.  I do not find any neurological deficit so will discharge the patient home, there are no alarming signs of cauda equina or epidural hematoma.  I have reviewed the MRI from 2011.  Will discharge home with pain medication and muscle relaxer, patient to follow up with Dr.  Lula Olszewski.        Carol Gross, [place] 08/22/11 2247

## 2011-08-22 NOTE — Discharge Instructions (Signed)
Chronic Back Pain When back pain lasts longer than 3 months, it is called chronic back pain.This pain can be frustrating, but the cause of the pain is rarely dangerous.People with chronic back pain often go through certain periods that are more intense (flare-ups). CAUSES Chronic back pain can be caused by wear and tear (degeneration) on different structures in your back. These structures may include bones, ligaments, or discs. This degeneration may result in more pressure being placed on the nerves that travel to your legs and feet. This can lead to pain traveling from the low back down the back of the legs. When pain lasts longer than 3 months, it is not unusual for people to experience anxiety or depression. Anxiety and depression can also contribute to low back pain. TREATMENT  Establish a regular exercise plan. This is critical to improving your functional level.   Have a self-management plan for when you flare-up. Flare-ups rarely require a medical visit. Regular exercise will help reduce the intensity and frequency of your flare-ups.   Manage how you feel about your back pain and the rest of your life. Anxiety, depression, and feeling that you cannot alter your back pain have been shown to make back pain more intense and debilitating.   Medicines should never be your only treatment. They should be used along with other treatments to help you return to a more active lifestyle.   Procedures such as injections or surgery may be helpful but are rarely necessary. You may be able to get the same results with physical therapy or chiropractic care.  HOME CARE INSTRUCTIONS  Avoid bending, heavy lifting, prolonged sitting, and activities which make the problem worse.   Continue normal activity as much as possible.   Take brief periods of rest throughout the day to reduce your pain during flare-ups.   Follow your back exercise rehabilitation program. This can help reduce symptoms and prevent  more pain.   Only take over-the-counter or prescription medicines as directed by your caregiver. Muscle relaxants are sometimes prescribed. Narcotic pain medicine is discouraged for long-term pain, since addiction is a possible outcome.   If you smoke, quit.   Eat healthy foods and maintain a recommended body weight.  SEEK IMMEDIATE MEDICAL CARE IF:   You have weakness or numbness in one of your legs or feet.   You have trouble controlling your bladder or bowels.   You develop nausea, vomiting, abdominal pain, shortness of breath, or fainting.  Document Released: 03/25/2004 Document Revised: 02/04/2011 Document Reviewed: 01/30/2011 Exit[employer] Patient Information 2012 [employer].Back Pain, Adult Low back pain is very common. About 1 in 5 people have back pain.The cause of low back pain is rarely dangerous. The pain often gets better over time.About half of people with a sudden onset of back pain feel better in just 2 weeks. About 8 in 10 people feel better by 6 weeks.  CAUSES Some common causes of back pain include:  Strain of the muscles or ligaments supporting the spine.   Wear and tear (degeneration) of the spinal discs.   Arthritis.   Direct injury to the back.  DIAGNOSIS Most of the time, the direct cause of low back pain is not known.However, back pain can be treated effectively even when the exact cause of the pain is unknown.Answering your caregiver's questions about your overall health and symptoms is one of the most accurate ways to make sure the cause of your pain is not dangerous. If your caregiver needs more information,   he or she may order lab work or imaging tests (X-rays or MRIs).However, even if imaging tests show changes in your back, this usually does not require surgery. HOME CARE INSTRUCTIONS For many people, back pain returns.Since low back pain is rarely dangerous, it is often a condition that people can learn to manageon their own.   Remain active. It  is stressful on the back to sit or stand in one place. Do not sit, drive, or stand in one place for more than 30 minutes at a time. Take short walks on level surfaces as soon as pain allows.Try to increase the length of time you walk each day.   Do not stay in bed.Resting more than 1 or 2 days can delay your recovery.   Do not avoid exercise or work.Your body is made to move.It is not dangerous to be active, even though your back may hurt.Your back will likely heal faster if you return to being active before your pain is gone.   Pay attention to your body when you bend and lift. Many people have less discomfortwhen lifting if they bend their knees, keep the load close to their bodies,and avoid twisting. Often, the most comfortable positions are those that put less stress on your recovering back.   Find a comfortable position to sleep. Use a firm mattress and lie on your side with your knees slightly bent. If you lie on your back, put a pillow under your knees.   Only take over-the-counter or prescription medicines as directed by your caregiver. Over-the-counter medicines to reduce pain and inflammation are often the most helpful.Your caregiver may prescribe muscle relaxant drugs.These medicines help dull your pain so you can more quickly return to your normal activities and healthy exercise.   Put ice on the injured area.   Put ice in a plastic bag.   Place a towel between your skin and the bag.   Leave the ice on for 15 to 20 minutes, 3 to 4 times a day for the first 2 to 3 days. After that, ice and heat may be alternated to reduce pain and spasms.   Ask your caregiver about trying back exercises and gentle massage. This may be of some benefit.   Avoid feeling anxious or stressed.Stress increases muscle tension and can worsen back pain.It is important to recognize when you are anxious or stressed and learn ways to manage it.Exercise is a great option.  SEEK MEDICAL CARE  IF:  You have pain that is not relieved with rest or medicine.   You have pain that does not improve in 1 week.   You have new symptoms.   You are generally not feeling well.  SEEK IMMEDIATE MEDICAL CARE IF:   You have pain that radiates from your back into your legs.   You develop new bowel or bladder control problems.   You have unusual weakness or numbness in your arms or legs.   You develop nausea or vomiting.   You develop abdominal pain.   You feel faint.  Document Released: 02/15/2005 Document Revised: 02/04/2011 Document Reviewed: 07/06/2010 Exit[employer] Patient Information 2012 [employer]. 

## 2011-08-22 NOTE — ED Notes (Signed)
Pt arrived by [facility] for lower back pain x 2 weeks, now unable to bear weight on left leg.

## 2011-08-23 NOTE — ED Provider Notes (Signed)
Medical screening examination/treatment/procedure(s) were performed by non-physician practitioner and as supervising physician I was immediately available for consultation/collaboration.   Joya Gaskins, MD 08/23/11 [phone]

## 2011-08-25 ENCOUNTER — Encounter (HOSPITAL_COMMUNITY): Payer: Self-pay | Admitting: *Deleted

## 2011-08-25 ENCOUNTER — Inpatient Hospital Stay (HOSPITAL_COMMUNITY)
Admission: AD | Admit: 2011-08-25 | Discharge: 2011-08-25 | Disposition: A | Discharge status: Home / Self Care | Payer: Medicaid Other | Source: Ambulatory Visit | Attending: Obstetrics & Gynecology | Admitting: Obstetrics & Gynecology

## 2011-08-25 DIAGNOSIS — M545 Low back pain, unspecified: Secondary | ICD-10-CM | POA: Insufficient documentation

## 2011-08-25 HISTORY — DX: Other chronic pain: G89.29

## 2011-08-25 HISTORY — DX: Dorsalgia, unspecified: M54.9

## 2011-08-25 HISTORY — DX: Essential (primary) hypertension: I10

## 2011-08-25 MED ORDER — KETOROLAC TROMETHAMINE 60 MG/2ML IM SOLN
60.0000 mg | Freq: Once | INTRAMUSCULAR | Status: AC
  Administered 2011-08-25: 60 mg via INTRAMUSCULAR
  Filled 2011-08-25: qty 2

## 2011-08-25 MED ORDER — IBUPROFEN 600 MG PO TABS: 600.0000 mg | ORAL_TABLET | Freq: Four times a day (QID) | ORAL | Status: AC | PRN

## 2011-08-25 NOTE — MAU Note (Signed)
Pt states her back pain has become much more severe in the last month.  Last took pain meds @ 0300.

## 2011-08-25 NOTE — MAU Provider Note (Signed)
History     CSN: [id number]  Arrival date and time: 08/25/11 1107   First Provider Initiated Contact with Patient 08/25/11 1138      Chief Complaint  Patient presents with  . Back Pain   HPI  Carol Gross 31 y.o. female patient presenting today for lower back pain.  States "I can't walk".  Patient was seen in [facility] 3 days ago and prescribed diazepam and hydrocodone.  She states it is not working.  Last dose at 2am this morning.  Has not taken any since then as she states "it's not helping".  Her lower back has bothered her for approximately 4 years.  Denies radiation into lower extremities.    OB History    Grav Para Term Preterm Abortions TAB SAB Ect Mult Living   3 3        3       Past Medical History  Diagnosis Date  . Depression   . Carpal tunnel syndrome   . Back pain     sees ortho  . Hypertension   . Chronic back pain     Past Surgical History  Procedure Date  . Tubal ligation   . Wisdom tooth extraction     History reviewed. No pertinent family history.  History  Substance Use Topics  . Smoking status: Current Everyday Smoker -- 0.3 packs/day    Types: Cigarettes  . Smokeless tobacco: Never Used   Comment: decreased smoking  . Alcohol Use: Yes     occasional    Allergies: No Known Allergies  Prescriptions prior to admission  Medication Sig Dispense Refill  . acetaminophen (TYLENOL) 325 MG tablet Take 650-975 mg by mouth every 6 (six) hours as needed. For pain      . diazepam (VALIUM) 5 MG tablet Take 1 tablet (5 mg total) by mouth every 6 (six) hours as needed (muscle spasms).  30 tablet  0  . HYDROcodone-acetaminophen (NORCO) 5-325 MG per tablet Take 1 tablet by mouth every 4 (four) hours as needed for pain.  30 tablet  0  . triamterene-hydrochlorothiazide (MAXZIDE-25) 37.5-25 MG per tablet Take 0.5 tablets by mouth daily.      . valACYclovir (VALTREX) 1000 MG tablet Take 1,000 mg by mouth daily as needed. Take for outbreak        Review of  Systems  Constitutional: Negative.   HENT: Negative.  Negative for neck pain.   Eyes: Negative.   Respiratory: Negative.   Cardiovascular: Negative.   Gastrointestinal: Negative.   Genitourinary: Negative.        Denies changes in bowel or bladder habits.  Musculoskeletal: Positive for back pain. Negative for joint pain and falls.  Skin: Negative.   Neurological: Positive for tingling. Negative for dizziness, tremors, sensory change, speech change, focal weakness, seizures and loss of consciousness.  Endo/Heme/Allergies: Negative.   Psychiatric/Behavioral: Negative.    Physical Exam   Blood pressure 115/72, pulse 92, temperature 97.6 F (36.4 C), temperature source Oral, resp. rate 20, last menstrual period 08/07/2011.  Physical Exam  Constitutional: She is oriented to person, place, and time. She appears well-developed and well-nourished.  HENT:  Head: Normocephalic and atraumatic.  Neck: Normal range of motion. Neck supple.  Cardiovascular: Normal rate and regular rhythm.   Respiratory: Effort normal and breath sounds normal.  GI: Soft. Bowel sounds are normal.  Musculoskeletal:       Negative edema.  Sensation and pulses intact to all extremities.  No symptoms of cauda equina.  ROM limited by patient's discomfort, but musculoskeletal and neuro intact.  5/5 strength.  Neurological: She is alert and oriented to person, place, and time.  Skin: Skin is warm and dry. No rash noted. No erythema.  Psychiatric: She has a normal mood and affect. Her behavior is normal.    MAU Course  Procedures  MDM  Toradol and ice pack for pain.    Patient able to walk to the bathroom with some assistance following injection and ice.  Feeling better and agrees with plan of care.  Assessment and Plan   Assessment: Low Back Pain  Plan:  Toradol 60mg  IM x 1 Ice pack as needed for pain. 600mg  of Ibuprofen every 8 hours for pain as prescribed. Contact the [facility] if needed  to be seen before your appointment tomorrow.  If needed go to the Emergency Room at [facility] or [facility] for further evaluation and treatment.  Servando Salina 08/25/2011, 11:54 AM

## 2011-08-25 NOTE — Discharge Instructions (Signed)
Back Pain, Adult Back pain is very common. The pain often gets better over time. The cause of back pain is usually not dangerous. Most people can learn to manage their back pain on their own.  HOME CARE   Stay active. Start with short walks on flat ground if you can. Try to walk farther each day.   Do not sit, drive, or stand in one place for more than 30 minutes. Do not stay in bed.   Do not avoid exercise or work. Activity can help your back heal faster.   Be careful when you bend or lift an object. Bend at your knees, keep the object close to you, and do not twist.   Sleep on a firm mattress. Lie on your side, and bend your knees. If you lie on your back, put a pillow under your knees.   Only take medicines as told by your doctor.   Put ice on the injured area.   Put ice in a plastic bag.   Place a towel between your skin and the bag.   Leave the ice on for 15 to 20 minutes, 3 to 4 times a day for the first 2 to 3 days. After that, you can switch between ice and heat packs.   Ask your doctor about back exercises or massage.   Avoid feeling anxious or stressed. Find good ways to deal with stress, such as exercise.  GET HELP RIGHT AWAY IF:   Your pain does not go away with rest or medicine.   Your pain does not go away in 1 week.   You have new problems.   You do not feel well.   The pain spreads into your legs.   You cannot control when you poop (bowel movement) or pee (urinate).   Your arms or legs feel weak or lose feeling (numbness).   You feel sick to your stomach (nauseous) or throw up (vomit).   You have belly (abdominal) pain.   You feel like you may pass out (faint).  MAKE SURE YOU:   Understand these instructions.   Will watch your condition.   Will get help right away if you are not doing well or get worse.  Document Released: 08/04/2007 Document Revised: 02/04/2011 Document Reviewed: 07/06/2010 [facility] Patient Information 2012 [place],  [place].  Take 600mg  of ibuprofen every 8 hours for back pain as described.  Use ice for the next 48 hours then start heat again.  Keep your appointment in the [facility] tomorrow.  If needing to be seen sooner, contact the clinic for an appointment this afternoon or go to the ED at [facility] or [facility].

## 2011-08-25 NOTE — MAU Note (Signed)
On going problem with low back pain, flared again past 3 wks.  Has been to the emergency  rm and [facility].  Has an appt scheduled for tomorrow.  Was given meds, makes her drowsy  But really doesn't help pain.  ? Hx of herniated disks.  Trying to walk or move causes pain in low back.

## 2011-08-26 ENCOUNTER — Encounter: Payer: Self-pay | Admitting: Family Medicine

## 2011-08-26 ENCOUNTER — Ambulatory Visit (INDEPENDENT_AMBULATORY_CARE_PROVIDER_SITE_OTHER): Payer: Self-pay | Admitting: Family Medicine

## 2011-08-26 VITALS — BP 120/81 | HR 97 | Ht 66.0 in | Wt 218.0 lb

## 2011-08-26 DIAGNOSIS — K5900 Constipation, unspecified: Secondary | ICD-10-CM

## 2011-08-26 DIAGNOSIS — M545 Low back pain, unspecified: Secondary | ICD-10-CM

## 2011-08-26 DIAGNOSIS — M549 Dorsalgia, unspecified: Secondary | ICD-10-CM

## 2011-08-26 LAB — POCT URINE PREGNANCY: Preg Test, Ur: NEGATIVE

## 2011-08-26 MED ORDER — BISACODYL 5 MG PO TBEC: 5.0000 mg | DELAYED_RELEASE_TABLET | Freq: Every day | ORAL | Status: AC | PRN

## 2011-08-26 MED ORDER — HYDROCODONE-ACETAMINOPHEN 5-325 MG PO TABS: 1.0000 | ORAL_TABLET | ORAL | Status: AC | PRN

## 2011-08-27 NOTE — Progress Notes (Signed)
  Subjective:    Patient ID: Carol Gross, female    DOB: [birth year], 31 y.o.   MRN: [id number]  HPI  Carol Gross is a 31 year old female with chronic back pain who presents with an acute flare.   Back Pain: Location: central lumbar area Duration: this pain crises has been 3 weeks in duration; setting of several years of back pain  Quality: 10/10 Preceding Events: jumped into a pool 3 weeks ago and landed feet first, since then pain and weakness have been markedly worse to the point that she needs assistance with ADL's; since that time she has come to family medicine for evaluation x 1, the ED x 1, and the MAU x 1; no imaging has been performed; in the ED Carol Gross was given hydrocone-acetaminophen and diazepam; in the MAU yesterdays she was given a shot of Ketoralac  Alleviating Factors: wearing a lumbar support back brace; minimal improvement with ibuprofen, hydrocodone-acetaminophen Exacerbating Factors: basically movement, walking, lying down, sitting upright Hx of intervention: no surgery, no PT; has epidural injection by Interventional Radiology on 2012 which provided little relief  Hx of imaging: MRI 2011 - L5/S1 central disc protrusion  Red Flags: no falls, yes numbness and tingling in inguinal area, yes impaired bowel or bladder function - had several episodes of enuresis which was 2/2 to poor mobility (patient using a cane)  Review of Systems Postiive for constipation; All other systems negative    Objective:   Physical Exam BP 120/81  Pulse 97  Ht 5\' 6"  (1.676 m)  Wt 218 lb (98.884 kg)  BMI 35.19 kg/m2  LMP 08/07/2011 Gen: Carol Gross, obese body habitus; very distressed and uncomfortable, constantly shifting in her seat Spine: exquisite tenderness to palpation in at lumbar-sacral junction; no obvious deformities Neuro: 0/0 DTR of achilles and patellar bilaterally; sensation to light touch and temperature in tact in lower extremities; inguinal area not evaluated for  saddle anesthesia MSK: 3/5 strength or RLE and 4/5 of LLE Gait: failed get up and go test; drags right leg when walking; very slow gait      Assessment & Plan:  31 y.o. Gross with acute on chronic back pain which may represent a vertebral fracture or herniated disc. Regardless, she needs further evaluation.

## 2011-08-27 NOTE — Assessment & Plan Note (Addendum)
Carol Gross is difficult to evaluate for this back pain, because she has such a long history of pain with fairly non revealing work-ups. Her pain has also been multi-systemic to the point that she met the criteria for somatization disorder at her visit in February. At this time, her back pain is significantly worse and much more debilitating. It is also coupled by neurologic signs and an inciting event. Therefore, I do not want to miss a vertebral fracture or disc herniation with cord impingements. Therefore, at this time I will order a lumbar MRI. For pain control, I will refill her Norco. We will touch base after imaging is completed.

## 2011-08-31 ENCOUNTER — Telehealth: Payer: Self-pay | Admitting: Family Medicine

## 2011-08-31 NOTE — Telephone Encounter (Signed)
Patient is call for her lab results

## 2011-08-31 NOTE — Telephone Encounter (Signed)
Pt is advised pregnancy test is negative.

## 2011-11-02 ENCOUNTER — Encounter: Payer: Self-pay | Admitting: Family Medicine

## 2011-12-24 ENCOUNTER — Encounter: Payer: Self-pay | Admitting: Family Medicine

## 2011-12-24 ENCOUNTER — Ambulatory Visit (INDEPENDENT_AMBULATORY_CARE_PROVIDER_SITE_OTHER): Payer: Medicaid Other | Admitting: Family Medicine

## 2011-12-24 VITALS — BP 127/85 | HR 82 | Temp 98.5°F | Ht 66.0 in | Wt 210.0 lb

## 2011-12-24 DIAGNOSIS — M545 Low back pain: Secondary | ICD-10-CM

## 2011-12-24 DIAGNOSIS — I10 Essential (primary) hypertension: Secondary | ICD-10-CM

## 2011-12-24 DIAGNOSIS — R229 Localized swelling, mass and lump, unspecified: Secondary | ICD-10-CM

## 2011-12-24 DIAGNOSIS — R29898 Other symptoms and signs involving the musculoskeletal system: Secondary | ICD-10-CM

## 2011-12-24 DIAGNOSIS — M549 Dorsalgia, unspecified: Secondary | ICD-10-CM

## 2011-12-24 DIAGNOSIS — A6000 Herpesviral infection of urogenital system, unspecified: Secondary | ICD-10-CM

## 2011-12-24 LAB — BASIC METABOLIC PANEL
CO2: 25 mEq/L (ref 19–32)
Chloride: 105 mEq/L (ref 96–112)
Glucose, Bld: 66 mg/dL — ABNORMAL LOW (ref 70–99)
Potassium: 3.9 mEq/L (ref 3.5–5.3)
Sodium: 140 mEq/L (ref 135–145)

## 2011-12-24 LAB — CBC
Hemoglobin: 12.3 g/dL (ref 12.0–15.0)
RBC: 4.61 MIL/uL (ref 3.87–5.11)

## 2011-12-24 MED ORDER — IBUPROFEN 600 MG PO TABS: 600.0000 mg | ORAL_TABLET | Freq: Three times a day (TID) | ORAL | Status: DC | PRN

## 2011-12-24 MED ORDER — CYCLOBENZAPRINE HCL 10 MG PO TABS: 10.0000 mg | ORAL_TABLET | Freq: Three times a day (TID) | ORAL | Status: DC | PRN

## 2011-12-24 MED ORDER — VALACYCLOVIR HCL 1 G PO TABS: 1000.0000 mg | ORAL_TABLET | Freq: Every day | ORAL | Status: DC | PRN

## 2011-12-24 NOTE — Patient Instructions (Signed)
Dear Mrs. Alleyne,   Thank you for coming to clinic today. Please read below regarding the issues that we discussed.   1. Lower Back Pain - Let's get the MRI to check to make sure nothing serious is going on. Please try extra strength ibuprofen and flexeril until then.   2. Knot on Arm - This is benign. We will follow it.   3. Blood Pressure - Let's stop the medication and follow it.   4. Pap Smear - Please come back in a few weeks for a pap.   Please follow up in clinic in 2 weeks. Please call earlier if you have any questions or concerns.   Sincerely,   Dr. Clinton Sawyer

## 2011-12-24 NOTE — Assessment & Plan Note (Signed)
BP well controlled off of meds, so do not restart currently. If still WNL at next visit, then permanently d/c meds.

## 2011-12-24 NOTE — Assessment & Plan Note (Signed)
No signs of malignancy. Continue to monitor.

## 2011-12-24 NOTE — Assessment & Plan Note (Signed)
The back pain is acute on chronic. Given her left lower extremity weakness and decreased patellar reflex, I would like to get an MRI. It was not obtained afte her last visit, b/c she did not have insurance.

## 2011-12-24 NOTE — Progress Notes (Signed)
  Subjective:    Patient ID: Carol Gross, female    DOB: 03-14-1980, 31 y.o.   MRN: [id number]  HPI 31 year old F with obesity, tobacco use disorder, and HTN.   1. Back Pain: Location: lower back pain, left sided, shooting past the knee - This was previously evaluated on 08/26/11. At that time she was having neurologic symptoms after jumping into a pool. An MRI was ordered, but she did not get it 2/2 cost since her Medicaid ran out.  Duration: years with recent worsening Quality: 8/10 Current Functional Status:  ADL's - no effected; difficulty working as Adult nurse Preceding Events: no falls. no trauma Alleviating Factors: sitting with feet propped Exacerbating Factors: standing on her feet Hx of intervention: no surgery, no PT Hx of meds: has tried flexeril successfully,vicodin, ibuprofen  Hx of imaging: MRI 2011 - L5/S1 central disc protrusion  Red Flags: mild left sided weakness, yes numbness and tingling, no impaired bowel or bladder function - but she does have urgency    2. Knot on right Forearm - Present for several years, previously evaluated and referred to general surgeon for removal by me in Feb 2013, however patient never showed to surgery referral; now she's concerned about it again; denies pain; denies change in size; denies any other similar nodules    3. Hypertension  Home BP monitoring:  BP Readings from Last 3 Encounters:  12/24/11 127/85  08/26/11 120/81  08/25/11 129/52    Prescribed meds: Triamterene - HCTZ - has not been taking for several months  Hypertension ROS: not taking medications regularly as instructed, no TIA's, no chest pain on exertion, no dyspnea on exertion and no swelling of ankles    Review of Systems Positive for abdominal fullness and vomiting  Negative for hematemesis, hematochezia, and unintentional weight loss, genital lesions or pain     Objective:   Physical Exam BP 127/85  Pulse 82  Temp 98.5 F (36.9 C) (Oral)  Ht 5\' 6"   (1.676 m)  Wt 210 lb (95.255 kg)  BMI 33.89 kg/m2  LMP 12/03/2011 Gen: alert and oriented x 4, young AAF, obese body habitus, non ill appearing, pleasant and conversant MSK: normal appearance of lumbar spine without deformity or swelling; TTP thought lumbar spine and posterior superior iliac crest bilaterally; 5/5 strength with poor effort on right LUE: 4/5 strength with poor effort LLE Neuro: 2+ DTR right patella; 0 DTR left patella; positive straight leg raise test on left Skin: small firm subcutaneous mobile nodule of right medial forearm, no calor, rubor, tumor or dolor      Assessment & Plan:  31 year old F with acute on chronic lower back pain that needs further investigation and a stable subcutaneous nodule.

## 2011-12-27 ENCOUNTER — Telehealth: Payer: Self-pay | Admitting: Family Medicine

## 2011-12-27 NOTE — Telephone Encounter (Signed)
Advised patient to call [facility] and ask them to contact [facility] to have RX  transferred over. She voices understanding.

## 2011-12-27 NOTE — Telephone Encounter (Signed)
Patient is calling because she needs the 3 meds that were sent to [facility] on Friday, to be sent to [facility] on [address].  She would like a call when this has been done.

## 2011-12-31 ENCOUNTER — Ambulatory Visit (HOSPITAL_COMMUNITY): Admission: RE | Admit: 2011-12-31 | Payer: Medicaid Other | Source: Ambulatory Visit

## 2012-01-07 ENCOUNTER — Ambulatory Visit (HOSPITAL_COMMUNITY)
Admission: RE | Admit: 2012-01-07 | Discharge: 2012-01-07 | Disposition: A | Discharge status: Home / Self Care | Payer: Medicaid Other | Source: Ambulatory Visit | Attending: Family Medicine | Admitting: Family Medicine

## 2012-01-07 DIAGNOSIS — M545 Low back pain, unspecified: Secondary | ICD-10-CM | POA: Insufficient documentation

## 2012-01-11 ENCOUNTER — Telehealth: Payer: Self-pay | Admitting: Family Medicine

## 2012-01-11 ENCOUNTER — Encounter: Payer: Self-pay | Admitting: Family Medicine

## 2012-01-11 DIAGNOSIS — A6000 Herpesviral infection of urogenital system, unspecified: Secondary | ICD-10-CM

## 2012-01-11 DIAGNOSIS — M5126 Other intervertebral disc displacement, lumbar region: Secondary | ICD-10-CM

## 2012-01-11 DIAGNOSIS — M545 Low back pain: Secondary | ICD-10-CM

## 2012-01-11 NOTE — Telephone Encounter (Signed)
Message left for patient stating that she has a disc herniation and may needd a referral to a surgeon. I asked her to call the clinic so we could discuss it. She needs to leave a time and phone number for me to call her.

## 2012-01-12 MED ORDER — CYCLOBENZAPRINE HCL 10 MG PO TABS: 10.0000 mg | ORAL_TABLET | Freq: Three times a day (TID) | ORAL | Status: DC | PRN

## 2012-01-12 MED ORDER — IBUPROFEN 600 MG PO TABS: 600.0000 mg | ORAL_TABLET | Freq: Three times a day (TID) | ORAL | Status: DC | PRN

## 2012-01-12 MED ORDER — VALACYCLOVIR HCL 1 G PO TABS: 1000.0000 mg | ORAL_TABLET | Freq: Every day | ORAL | Status: DC | PRN

## 2012-01-12 NOTE — Addendum Note (Signed)
Addended by: Garnetta Buddy on: 01/12/2012 05:52 PM   Modules accepted: Orders

## 2012-01-12 NOTE — Telephone Encounter (Signed)
Pt returned call - pls call her at [phone]

## 2012-01-12 NOTE — Telephone Encounter (Signed)
Patient notified of MRI findings, and I suggested that she return to see Dr. Jeral Fruit at [facility]. She is in agreement. She also needs new rx for her meds sent to the pharmacy since she was not able to purchase her meds last time they were sent.

## 2012-01-20 ENCOUNTER — Telehealth: Payer: Self-pay | Admitting: Family Medicine

## 2012-01-20 NOTE — Telephone Encounter (Signed)
Called and told patient referral was sent to [facility] and they should be calling her with an appointment. Told her to call back if she has not heard from them by the end of next week.Busick, Rodena Medin

## 2012-01-20 NOTE — Telephone Encounter (Signed)
Pt is asking about her referral to [facility]

## 2012-02-02 ENCOUNTER — Telehealth: Payer: Self-pay | Admitting: Family Medicine

## 2012-02-02 NOTE — Telephone Encounter (Signed)
Pt is calling about her referral to neurosurgery - hasn't heard anything yet - would like it scheduled for the 1st of the year

## 2012-02-02 NOTE — Telephone Encounter (Signed)
Called patient and told her to call [facility] and schedule her appointment with them, we have already sent in the [facility], Carol Gross

## 2012-02-11 ENCOUNTER — Telehealth: Payer: Self-pay | Admitting: Family Medicine

## 2012-02-11 NOTE — Telephone Encounter (Signed)
Called pt. She reports, that she does not have an appt at [facility]. They did not call her. I told the pt, that we would call them today and check on appt. Pt also request a note stating that she is unable to work until after surgery. She request appt in January 2014. I told the pt, that I can not write the note. It has to be reviewed by Dr.Williamson. Pt agreed. I will call her back. Lorenda Hatchet, Renato Battles

## 2012-02-11 NOTE — Telephone Encounter (Signed)
Pt is having surgery in January and needs a letter stating that she can't work until after surgery

## 2012-02-14 NOTE — Telephone Encounter (Signed)
Letter not yet written as I am trying to figure out when and if patient truly has appointment with neurosurgeon.

## 2012-02-15 NOTE — Telephone Encounter (Signed)
Carol Gross is scheduled to see Dr. Jeral Fruit at [facility]:  02/24/2012 @ 240 pm.  [name]

## 2012-02-18 NOTE — Telephone Encounter (Signed)
Left message for patient informing her of the letter she requested is at the front desk ready for her to pick up.Busick, Rodena Medin

## 2012-02-18 NOTE — Telephone Encounter (Signed)
I will write a letter for the patient. Please call her and let her know that is is available for pick up at our front desk now.   Thank you.

## 2012-02-21 ENCOUNTER — Telehealth: Payer: Self-pay | Admitting: *Deleted

## 2012-02-21 NOTE — Telephone Encounter (Signed)
Called pt to pick up letter. Lorenda Hatchet, Renato Battles

## 2012-02-24 ENCOUNTER — Other Ambulatory Visit: Payer: Self-pay | Admitting: Neurosurgery

## 2012-02-25 ENCOUNTER — Encounter (HOSPITAL_COMMUNITY): Payer: Self-pay | Admitting: Respiratory Therapy

## 2012-03-02 ENCOUNTER — Encounter (HOSPITAL_COMMUNITY): Payer: Self-pay

## 2012-03-02 ENCOUNTER — Encounter (HOSPITAL_COMMUNITY)
Admission: RE | Admit: 2012-03-02 | Discharge: 2012-03-02 | Disposition: A | Discharge status: Home / Self Care | Payer: Medicaid Other | Source: Ambulatory Visit | Attending: Neurosurgery | Admitting: Neurosurgery

## 2012-03-02 ENCOUNTER — Other Ambulatory Visit (HOSPITAL_COMMUNITY): Payer: Medicaid Other

## 2012-03-02 HISTORY — DX: Shortness of breath: R06.02

## 2012-03-02 HISTORY — DX: Headache: R51

## 2012-03-02 LAB — SURGICAL PCR SCREEN
MRSA, PCR: NEGATIVE
Staphylococcus aureus: NEGATIVE

## 2012-03-02 LAB — CBC
HCT: 37.7 % (ref 36.0–46.0)
MCHC: 32.9 g/dL (ref 30.0–36.0)
Platelets: 427 10*3/uL — ABNORMAL HIGH (ref 150–400)
RDW: 16 % — ABNORMAL HIGH (ref 11.5–15.5)

## 2012-03-02 MED ORDER — CEFAZOLIN SODIUM-DEXTROSE 2-3 GM-% IV SOLR
2.0000 g | INTRAVENOUS | Status: AC
  Administered 2012-03-03: 2 g via INTRAVENOUS
  Filled 2012-03-02: qty 50

## 2012-03-02 NOTE — Progress Notes (Signed)
Primary Physician - [facility]. Cardiologist - none No cardiac testing performed in past. Patient does not have hypertension per MD - was previously on her records

## 2012-03-02 NOTE — Pre-Procedure Instructions (Signed)
22 Carol Gross  03/02/2012   Your procedure is scheduled on:  Friday, January 3rd  Report to [facility] at 0715 AM.  Call this number if you have problems the morning of surgery: [phone]   Remember:   Do not eat food or drink:After Midnight.   Take these medicines the morning of surgery with A SIP OF WATER: percocet if needed   Do not wear jewelry, make-up or nail polish.  Do not wear lotions, powders, or perfumes.   Do not shave 48 hours prior to surgery.   Do not bring valuables to the hospital.  Contacts, dentures or bridgework may not be worn into surgery.  Leave suitcase in the car. After surgery it may be brought to your room.  For patients admitted to the hospital, checkout time is 11:00 AM the day of discharge.   Patients discharged the day of surgery will not be allowed to drive home.    Special Instructions: Shower using CHG 2 nights before surgery and the night before surgery.  If you shower the day of surgery use CHG.  Use special wash - you have one bottle of CHG for all showers.  You should use approximately 1/3 of the bottle for each shower.   Please read over the following fact sheets that you were given: Pain Booklet, Coughing and Deep Breathing, MRSA Information and Surgical Site Infection Prevention

## 2012-03-03 ENCOUNTER — Encounter (HOSPITAL_COMMUNITY): Payer: Self-pay | Admitting: *Deleted

## 2012-03-03 ENCOUNTER — Encounter (HOSPITAL_COMMUNITY): Payer: Self-pay | Admitting: Anesthesiology

## 2012-03-03 ENCOUNTER — Encounter (HOSPITAL_COMMUNITY)
Admission: RE | Disposition: A | Discharge status: Home / Self Care | Payer: Self-pay | Source: Ambulatory Visit | Attending: Neurosurgery

## 2012-03-03 ENCOUNTER — Observation Stay (HOSPITAL_COMMUNITY)
Admission: RE | Admit: 2012-03-03 | Discharge: 2012-03-04 | DRG: 491 | Disposition: A | Discharge status: Home / Self Care | Payer: Medicaid Other | Source: Ambulatory Visit | Attending: Neurosurgery | Admitting: Neurosurgery

## 2012-03-03 ENCOUNTER — Ambulatory Visit (HOSPITAL_COMMUNITY): Payer: Medicaid Other | Admitting: Anesthesiology

## 2012-03-03 ENCOUNTER — Ambulatory Visit (HOSPITAL_COMMUNITY): Payer: Medicaid Other

## 2012-03-03 DIAGNOSIS — M5126 Other intervertebral disc displacement, lumbar region: Principal | ICD-10-CM | POA: Insufficient documentation

## 2012-03-03 DIAGNOSIS — Z01812 Encounter for preprocedural laboratory examination: Secondary | ICD-10-CM | POA: Insufficient documentation

## 2012-03-03 DIAGNOSIS — I10 Essential (primary) hypertension: Secondary | ICD-10-CM | POA: Insufficient documentation

## 2012-03-03 HISTORY — PX: LUMBAR LAMINECTOMY/DECOMPRESSION MICRODISCECTOMY: SHX5026

## 2012-03-03 SURGERY — LUMBAR LAMINECTOMY/DECOMPRESSION MICRODISCECTOMY 1 LEVEL
Anesthesia: General | Site: Back | Laterality: Left | Wound class: Clean

## 2012-03-03 MED ORDER — ACETAMINOPHEN 325 MG PO TABS: 650.0000 mg | ORAL_TABLET | ORAL | Status: DC | PRN

## 2012-03-03 MED ORDER — LACTATED RINGERS IV SOLN
INTRAVENOUS | Status: DC | PRN
  Administered 2012-03-03 (×2): via INTRAVENOUS

## 2012-03-03 MED ORDER — OXYCODONE HCL 5 MG/5ML PO SOLN: 5.0000 mg | Freq: Once | ORAL | Status: AC | PRN

## 2012-03-03 MED ORDER — SODIUM CHLORIDE 0.9 % IV SOLN: INTRAVENOUS | Status: DC

## 2012-03-03 MED ORDER — ACETAMINOPHEN 10 MG/ML IV SOLN
INTRAVENOUS | Status: AC
  Filled 2012-03-03: qty 100

## 2012-03-03 MED ORDER — GLYCOPYRROLATE 0.2 MG/ML IJ SOLN
INTRAMUSCULAR | Status: DC | PRN
  Administered 2012-03-03: .8 mg via INTRAVENOUS

## 2012-03-03 MED ORDER — 0.9 % SODIUM CHLORIDE (POUR BTL) OPTIME
TOPICAL | Status: DC | PRN
  Administered 2012-03-03: 1000 mL

## 2012-03-03 MED ORDER — NEOSTIGMINE METHYLSULFATE 1 MG/ML IJ SOLN
INTRAMUSCULAR | Status: DC | PRN
  Administered 2012-03-03: 5 mg via INTRAVENOUS

## 2012-03-03 MED ORDER — OXYCODONE-ACETAMINOPHEN 5-325 MG PO TABS
1.0000 | ORAL_TABLET | ORAL | Status: DC | PRN
  Administered 2012-03-03 – 2012-03-04 (×3): 2 via ORAL
  Filled 2012-03-03 (×3): qty 2

## 2012-03-03 MED ORDER — ZOLPIDEM TARTRATE 5 MG PO TABS: 10.0000 mg | ORAL_TABLET | Freq: Every evening | ORAL | Status: DC | PRN

## 2012-03-03 MED ORDER — SODIUM CHLORIDE 0.9 % IJ SOLN
3.0000 mL | Freq: Two times a day (BID) | INTRAMUSCULAR | Status: DC
  Administered 2012-03-03 – 2012-03-04 (×2): 3 mL via INTRAVENOUS

## 2012-03-03 MED ORDER — ROCURONIUM BROMIDE 100 MG/10ML IV SOLN
INTRAVENOUS | Status: DC | PRN
  Administered 2012-03-03: 50 mg via INTRAVENOUS

## 2012-03-03 MED ORDER — MENTHOL 3 MG MT LOZG: 1.0000 | LOZENGE | OROMUCOSAL | Status: DC | PRN

## 2012-03-03 MED ORDER — LIDOCAINE HCL (CARDIAC) 20 MG/ML IV SOLN
INTRAVENOUS | Status: DC | PRN
  Administered 2012-03-03: 100 mg via INTRAVENOUS

## 2012-03-03 MED ORDER — ACETAMINOPHEN 10 MG/ML IV SOLN
INTRAVENOUS | Status: DC | PRN
  Administered 2012-03-03: 1000 mg via INTRAVENOUS

## 2012-03-03 MED ORDER — OXYCODONE HCL 5 MG PO TABS
ORAL_TABLET | ORAL | Status: AC
  Filled 2012-03-03: qty 1

## 2012-03-03 MED ORDER — HYDROMORPHONE HCL PF 1 MG/ML IJ SOLN
0.2500 mg | INTRAMUSCULAR | Status: DC | PRN
  Administered 2012-03-03 (×4): 0.5 mg via INTRAVENOUS

## 2012-03-03 MED ORDER — MEPERIDINE HCL 25 MG/ML IJ SOLN: 6.2500 mg | INTRAMUSCULAR | Status: DC | PRN

## 2012-03-03 MED ORDER — FENTANYL CITRATE 0.05 MG/ML IJ SOLN
INTRAMUSCULAR | Status: DC | PRN
  Administered 2012-03-03: 100 ug via INTRAVENOUS

## 2012-03-03 MED ORDER — CEFAZOLIN SODIUM 1-5 GM-% IV SOLN
1.0000 g | Freq: Three times a day (TID) | INTRAVENOUS | Status: AC
  Administered 2012-03-03 – 2012-03-04 (×2): 1 g via INTRAVENOUS
  Filled 2012-03-03 (×3): qty 50

## 2012-03-03 MED ORDER — PHENOL 1.4 % MT LIQD: 1.0000 | OROMUCOSAL | Status: DC | PRN

## 2012-03-03 MED ORDER — PROPOFOL 10 MG/ML IV BOLUS
INTRAVENOUS | Status: DC | PRN
  Administered 2012-03-03: 130 mg via INTRAVENOUS

## 2012-03-03 MED ORDER — ARTIFICIAL TEARS OP OINT
TOPICAL_OINTMENT | OPHTHALMIC | Status: DC | PRN
  Administered 2012-03-03: 1 via OPHTHALMIC

## 2012-03-03 MED ORDER — HYDROMORPHONE HCL PF 1 MG/ML IJ SOLN
INTRAMUSCULAR | Status: AC
  Filled 2012-03-03: qty 1

## 2012-03-03 MED ORDER — ACETAMINOPHEN 650 MG RE SUPP: 650.0000 mg | RECTAL | Status: DC | PRN

## 2012-03-03 MED ORDER — ONDANSETRON HCL 4 MG/2ML IJ SOLN
4.0000 mg | INTRAMUSCULAR | Status: DC | PRN
  Administered 2012-03-03: 4 mg via INTRAVENOUS
  Filled 2012-03-03: qty 2

## 2012-03-03 MED ORDER — ONDANSETRON HCL 4 MG/2ML IJ SOLN: 4.0000 mg | Freq: Once | INTRAMUSCULAR | Status: DC | PRN

## 2012-03-03 MED ORDER — DIAZEPAM 5 MG PO TABS
5.0000 mg | ORAL_TABLET | Freq: Four times a day (QID) | ORAL | Status: DC | PRN
Start: 2012-03-03 — End: 2012-03-04

## 2012-03-03 MED ORDER — THROMBIN 5000 UNITS EX SOLR
CUTANEOUS | Status: DC | PRN
  Administered 2012-03-03 (×2): 5000 [IU] via TOPICAL

## 2012-03-03 MED ORDER — FENTANYL CITRATE 0.05 MG/ML IJ SOLN
INTRAMUSCULAR | Status: DC | PRN
  Administered 2012-03-03 (×3): 50 ug via INTRAVENOUS
  Administered 2012-03-03: 100 ug via INTRAVENOUS
  Administered 2012-03-03 (×5): 50 ug via INTRAVENOUS

## 2012-03-03 MED ORDER — MORPHINE SULFATE 2 MG/ML IJ SOLN: 1.0000 mg | INTRAMUSCULAR | Status: DC | PRN

## 2012-03-03 MED ORDER — METHYLPREDNISOLONE ACETATE 80 MG/ML IJ SUSP
INTRAMUSCULAR | Status: DC | PRN
  Administered 2012-03-03: 80 mg

## 2012-03-03 MED ORDER — MIDAZOLAM HCL 5 MG/5ML IJ SOLN
INTRAMUSCULAR | Status: DC | PRN
  Administered 2012-03-03: 2 mg via INTRAVENOUS

## 2012-03-03 MED ORDER — SODIUM CHLORIDE 0.9 % IJ SOLN: 3.0000 mL | INTRAMUSCULAR | Status: DC | PRN

## 2012-03-03 MED ORDER — FENTANYL CITRATE 0.05 MG/ML IJ SOLN
INTRAMUSCULAR | Status: AC
  Filled 2012-03-03: qty 2

## 2012-03-03 MED ORDER — ONDANSETRON HCL 4 MG/2ML IJ SOLN
INTRAMUSCULAR | Status: DC | PRN
  Administered 2012-03-03: 4 mg via INTRAVENOUS

## 2012-03-03 MED ORDER — OXYCODONE HCL 5 MG PO TABS
5.0000 mg | ORAL_TABLET | Freq: Once | ORAL | Status: AC | PRN
  Administered 2012-03-03: 5 mg via ORAL

## 2012-03-03 MED ORDER — HEMOSTATIC AGENTS (NO CHARGE) OPTIME
TOPICAL | Status: DC | PRN
  Administered 2012-03-03: 1 via TOPICAL

## 2012-03-03 SURGICAL SUPPLY — 60 items
APL SKNCLS STERI-STRIP NONHPOA (GAUZE/BANDAGES/DRESSINGS) ×1
BENZOIN TINCTURE PRP APPL 2/3 (GAUZE/BANDAGES/DRESSINGS) ×2 IMPLANT
BLADE SURG ROTATE 9660 (MISCELLANEOUS) IMPLANT
BUR ACORN 6.0 (BURR) ×2 IMPLANT
BUR MATCHSTICK NEURO 3.0 LAGG (BURR) ×1 IMPLANT
CANISTER SUCTION 2500CC (MISCELLANEOUS) ×2 IMPLANT
CLOTH BEACON ORANGE TIMEOUT ST (SAFETY) ×2 IMPLANT
CONT SPEC 4OZ CLIKSEAL STRL BL (MISCELLANEOUS) ×2 IMPLANT
DRAPE LAPAROTOMY 100X72X124 (DRAPES) ×2 IMPLANT
DRAPE MICROSCOPE LEICA (MISCELLANEOUS) ×2 IMPLANT
DRAPE POUCH INSTRU U-SHP 10X18 (DRAPES) ×2 IMPLANT
DRSG PAD ABDOMINAL 8X10 ST (GAUZE/BANDAGES/DRESSINGS) IMPLANT
DURAPREP 26ML APPLICATOR (WOUND CARE) ×2 IMPLANT
ELECT BLADE 4.0 EZ CLEAN MEGAD (MISCELLANEOUS) ×2
ELECT REM PT RETURN 9FT ADLT (ELECTROSURGICAL) ×2
ELECTRODE BLDE 4.0 EZ CLN MEGD (MISCELLANEOUS) IMPLANT
ELECTRODE REM PT RTRN 9FT ADLT (ELECTROSURGICAL) ×1 IMPLANT
GAUZE SPONGE 4X4 16PLY XRAY LF (GAUZE/BANDAGES/DRESSINGS) IMPLANT
GLOVE BIO SURGEON STRL SZ8.5 (GLOVE) ×1 IMPLANT
GLOVE BIOGEL M 8.0 STRL (GLOVE) ×2 IMPLANT
GLOVE ECLIPSE 7.5 STRL STRAW (GLOVE) ×3 IMPLANT
GLOVE EXAM NITRILE LRG STRL (GLOVE) ×1 IMPLANT
GLOVE EXAM NITRILE MD LF STRL (GLOVE) IMPLANT
GLOVE EXAM NITRILE XL STR (GLOVE) IMPLANT
GLOVE EXAM NITRILE XS STR PU (GLOVE) IMPLANT
GLOVE INDICATOR 8.0 STRL GRN (GLOVE) ×1 IMPLANT
GLOVE OPTIFIT SS 8.0 STRL (GLOVE) ×1 IMPLANT
GOWN BRE IMP SLV AUR LG STRL (GOWN DISPOSABLE) ×2 IMPLANT
GOWN BRE IMP SLV AUR XL STRL (GOWN DISPOSABLE) ×1 IMPLANT
GOWN STRL REIN 2XL LVL4 (GOWN DISPOSABLE) ×1 IMPLANT
KIT BASIN OR (CUSTOM PROCEDURE TRAY) ×2 IMPLANT
KIT ROOM TURNOVER OR (KITS) ×2 IMPLANT
NDL HYPO 18GX1.5 BLUNT FILL (NEEDLE) IMPLANT
NDL HYPO 21X1.5 SAFETY (NEEDLE) IMPLANT
NDL HYPO 25X1 1.5 SAFETY (NEEDLE) IMPLANT
NDL SPNL 20GX3.5 QUINCKE YW (NEEDLE) IMPLANT
NEEDLE HYPO 18GX1.5 BLUNT FILL (NEEDLE) IMPLANT
NEEDLE HYPO 21X1.5 SAFETY (NEEDLE) IMPLANT
NEEDLE HYPO 25X1 1.5 SAFETY (NEEDLE) IMPLANT
NEEDLE SPNL 20GX3.5 QUINCKE YW (NEEDLE) IMPLANT
NS IRRIG 1000ML POUR BTL (IV SOLUTION) ×2 IMPLANT
PACK LAMINECTOMY NEURO (CUSTOM PROCEDURE TRAY) ×2 IMPLANT
PAD ARMBOARD 7.5X6 YLW CONV (MISCELLANEOUS) ×6 IMPLANT
PATTIES SURGICAL .5 X1 (DISPOSABLE) ×2 IMPLANT
RUBBERBAND STERILE (MISCELLANEOUS) ×4 IMPLANT
SPONGE GAUZE 4X4 12PLY (GAUZE/BANDAGES/DRESSINGS) ×2 IMPLANT
SPONGE LAP 4X18 X RAY DECT (DISPOSABLE) IMPLANT
SPONGE SURGIFOAM ABS GEL SZ50 (HEMOSTASIS) ×2 IMPLANT
STRIP CLOSURE SKIN 1/2X4 (GAUZE/BANDAGES/DRESSINGS) ×2 IMPLANT
SUT VIC AB 0 CT1 18XCR BRD8 (SUTURE) ×1 IMPLANT
SUT VIC AB 0 CT1 8-18 (SUTURE) ×2
SUT VIC AB 2-0 CP2 18 (SUTURE) ×2 IMPLANT
SUT VIC AB 3-0 SH 8-18 (SUTURE) ×2 IMPLANT
SYR 20CC LL (SYRINGE) IMPLANT
SYR 20ML ECCENTRIC (SYRINGE) ×2 IMPLANT
SYR 5ML LL (SYRINGE) IMPLANT
TAPE CLOTH SURG 4X10 WHT LF (GAUZE/BANDAGES/DRESSINGS) ×1 IMPLANT
TOWEL OR 17X24 6PK STRL BLUE (TOWEL DISPOSABLE) ×2 IMPLANT
TOWEL OR 17X26 10 PK STRL BLUE (TOWEL DISPOSABLE) ×2 IMPLANT
WATER STERILE IRR 1000ML POUR (IV SOLUTION) ×2 IMPLANT

## 2012-03-03 NOTE — Anesthesia Postprocedure Evaluation (Signed)
Anesthesia Post Note  Patient: Carol Gross  Procedure(s) Performed: Procedure(s) (LRB): LUMBAR LAMINECTOMY/DECOMPRESSION MICRODISCECTOMY 1 LEVEL (Left)  Anesthesia type: general  Patient location: PACU  Post pain: Pain level controlled  Post assessment: Patient's Cardiovascular Status Stable  Last Vitals:  Filed Vitals:   03/03/12 1326  BP:   Pulse: 59  Temp:   Resp: 20    Post vital signs: Reviewed and stable  Level of consciousness: sedated  Complications: No apparent anesthesia complications

## 2012-03-03 NOTE — Progress Notes (Signed)
Op note [phone]

## 2012-03-03 NOTE — Preoperative (Signed)
Beta Blockers   Reason not to administer Beta Blockers:Not Applicable 

## 2012-03-03 NOTE — Transfer of Care (Signed)
Immediate Anesthesia Transfer of Care Note  Patient: Carol Gross  Procedure(s) Performed: Procedure(s) (LRB) with comments: LUMBAR LAMINECTOMY/DECOMPRESSION MICRODISCECTOMY 1 LEVEL (Left) - Left Lumbar five sacral one Diskectomy  Patient Location: PACU  Anesthesia Type:General  Level of Consciousness: sedated   Airway & Oxygen Therapy: Patient Spontanous Breathing and Patient connected to face mask  Post-op Assessment: Report given to PACU RN and Post -op Vital signs reviewed and stable  Post vital signs: Reviewed and stable  Complications: No apparent anesthesia complications

## 2012-03-03 NOTE — Op Note (Signed)
NAMEHERMELINDA, [name] NO.:  [id number]  MEDICAL RECORD NO.:  [id number]  LOCATION:  5N10C                        FACILITY:  [facility]  PHYSICIAN:  Hilda Lias, M.D.   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  PROCEDURE:  Left L5-S1 diskectomy, decompression of the thecal sac as well as the L5-S1 nerve root.  Microscope.  SURGEON:  Hilda Lias, M.D.  ASSISTANT SURGEONS:  Cristi Loron, M.D.  CLINICAL HISTORY:  [name] is a 32 year old female complaining of back pain radiation to the left leg for at least 3 years.  She has failed with conservative treatment.  X-ray showed that she has a central herniated disk at the level of 5-1.  The patient had no pain to the left side.  Surgery was advised in view of no improvement.  The patient knew the risk and benefit with the surgery.  PROCEDURE:  The patient was taken to the OR.  After intubation, she was positioned on a prone manner.  The area was cleaned with DuraPrep.  It was difficult to feel any spinal process.  Another x-ray showed that indeed it was right at the level of 5-1.  From then on, after we applied drapes, an incision was made through the skin and subcutaneous tissue straight down to the lumbar area.  Muscle was retracted laterally.  We brought the microscope into the area, and we were able to drill the lamina of L5-S1.  A thick yellow ligament was also excised.  Then, we were able to see the S1 nerve root, which was swollen and reddish. Retraction was done medially and we entered the disk space with large amount of degenerative disk mostly medial were removed.  At the end, we had plenty of space for the thecal sac as well as the L5 and S1  nerve root.  Valsalva maneuver was negative.  Then, Depo-Medrol and fentanyl were left in the epidural space and the wound was closed with Vicryl and a Steri-Strip..          ______________________________ Hilda Lias, M.D.     EB/MEDQ  D:  03/03/2012  T:  03/03/2012  Job:  [id number]

## 2012-03-03 NOTE — Anesthesia Procedure Notes (Signed)
Procedure Name: Intubation Date/Time: 03/03/2012 10:52 AM Performed by: Sherie Don Pre-anesthesia Checklist: Patient identified, Emergency Drugs available, Suction available, Patient being monitored and Timeout performed Patient Re-evaluated:Patient Re-evaluated prior to inductionOxygen Delivery Method: Circle system utilized Preoxygenation: Pre-oxygenation with 100% oxygen Intubation Type: IV induction Ventilation: Mask ventilation without difficulty Laryngoscope Size: Mac and 4 Grade View: Grade I Tube type: Oral Tube size: 7.0 mm Number of attempts: 1 Airway Equipment and Method: Stylet Placement Confirmation: ETT inserted through vocal cords under direct vision,  breath sounds checked- equal and bilateral and positive ETCO2 Secured at: 22 cm Tube secured with: Tape Dental Injury: Teeth and Oropharynx as per pre-operative assessment

## 2012-03-03 NOTE — H&P (Signed)
Carol Gross is an 32 y.o. female.   Chief Complaint: lbp HPI: 3 to 4 years history of lumbar pain with radiation to the left leg and not getting better with medications. Repeat mri of lumbar spine and she was send back to [place] for surgical treatment.  Past Medical History  Diagnosis Date  . Depression   . Carpal tunnel syndrome   . Back pain     sees ortho  . Chronic back pain   . Shortness of breath     exertion  . Headache     Past Surgical History  Procedure Date  . Tubal ligation   . Wisdom tooth extraction     History reviewed. No pertinent family history. Social History:  reports that she has been smoking Cigarettes.  She has a 2.5 pack-year smoking history. She has never used smokeless tobacco. She reports that she drinks alcohol. She reports that she uses illicit drugs (Marijuana).  Allergies: No Known Allergies  Medications Prior to Admission  Medication Sig Dispense Refill  . cyclobenzaprine (FLEXERIL) 10 MG tablet Take 1 tablet (10 mg total) by mouth 3 (three) times daily as needed for muscle spasms.  30 tablet  1  . ibuprofen (ADVIL,MOTRIN) 600 MG tablet Take 1 tablet (600 mg total) by mouth every 8 (eight) hours as needed for pain.  60 tablet  1  . oxyCODONE-acetaminophen (PERCOCET) 10-325 MG per tablet Take 1 tablet by mouth every 4 (four) hours as needed. For pain      . valACYclovir (VALTREX) 1000 MG tablet Take 1 tablet (1,000 mg total) by mouth daily as needed. Take for outbreak  60 tablet  2    Results for orders placed during the hospital encounter of 03/03/12 (from the past 48 hour(s))  HCG, SERUM, QUALITATIVE     Status: Normal   Collection Time   03/03/12  8:04 AM      Component Value Range Comment   Preg, Serum NEGATIVE  NEGATIVE    No results found.  Review of Systems  Constitutional: Negative.   HENT: Positive for neck pain.   Eyes: Negative.   Respiratory: Negative.   Cardiovascular: Negative.   Gastrointestinal: Negative.     Genitourinary: Negative.   Musculoskeletal: Positive for back pain.  Skin: Negative.   Neurological: Positive for sensory change and focal weakness.  Endo/Heme/Allergies: Negative.   Psychiatric/Behavioral: Negative.     Blood pressure 125/84, pulse 87, temperature 98.2 F (36.8 C), temperature source Oral, resp. rate 20, height 5\' 6"  (1.676 m), weight 99.338 kg (219 lb), SpO2 100.00%. Physical Exam patient came to my office limping from the left leg and unabke to sit,. Hent,nl. Neck, nl. Lungs, clear, cv, nl abdomen, nl. Extremoties, nl. NEURO SLR IN LEFT LEG POSITIVE AT 15. WEAKNESS OF PLANTIFLEXION. LUMBAR mri showed a large l5s1 hnp centrally and slight to the left.  Assessment/Plan Left l5s1 discectomy. Patient aware if risks and benefits  Esli Jernigan M 03/03/2012, 10:10 AM

## 2012-03-03 NOTE — Anesthesia Preprocedure Evaluation (Signed)
Anesthesia Evaluation  Patient identified by MRN, date of birth, ID band Patient awake    Reviewed: Allergy & Precautions, H&P , NPO status   Airway Mallampati: I TM Distance: >3 FB Neck ROM: Full    Dental   Pulmonary          Cardiovascular hypertension,     Neuro/Psych    GI/Hepatic   Endo/Other    Renal/GU      Musculoskeletal   Abdominal   Peds  Hematology   Anesthesia Other Findings   Reproductive/Obstetrics                           Anesthesia Physical Anesthesia Plan  ASA: II  Anesthesia Plan: General   Post-op Pain Management:    Induction: Intravenous  Airway Management Planned: Oral ETT  Additional Equipment:   Intra-op Plan:   Post-operative Plan: Extubation in OR  Informed Consent: I have reviewed the patients History and Physical, chart, labs and discussed the procedure including the risks, benefits and alternatives for the proposed anesthesia with the patient or authorized representative who has indicated his/her understanding and acceptance.     Plan Discussed with: CRNA and Surgeon  Anesthesia Plan Comments:         Anesthesia Quick Evaluation

## 2012-03-03 NOTE — Plan of Care (Signed)
Problem: Consults Goal: Diagnosis - Spinal Surgery Outcome: Completed/Met Date Met:  03/03/12 Microdiscectomy

## 2012-03-04 NOTE — Discharge Summary (Signed)
  Physician Discharge Summary  Patient ID: KHAMANI DANIELY MRN: [id number] DOB/AGE: 1981-02-20 31 y.o.  Admit date: 03/03/2012 Discharge date: 03/04/2012  Admission Diagnoses: And herniated nucleus pulposus L5-S1  Discharge Diagnoses: Same Active Problems:  * No active hospital problems. *    Discharged Condition: good  Hospital Course: Patient was admitted hospital underwent laminectomy discectomy postoperatively patient did very well recovered in the floor on the floor patient was convalescing well and living and voiding spontaneously tolerating regular diet and was stable and be discharged home.  Consults: Significant Diagnostic Studies: Treatments: L5-S1 laminectomy microdiscectomy Discharge Exam: Blood pressure 119/63, pulse 91, temperature 98.3 F (36.8 C), temperature source Oral, resp. rate 18, height 5\' 7"  (1.702 m), weight 99.338 kg (219 lb), SpO2 100.00%. Out of 5 wound clean and dry  Disposition: Home     Medication List     As of 03/04/2012  9:52 AM    TAKE these medications         cyclobenzaprine 10 MG tablet   Commonly known as: FLEXERIL   Take 1 tablet (10 mg total) by mouth 3 (three) times daily as needed for muscle spasms.      ibuprofen 600 MG tablet   Commonly known as: ADVIL,MOTRIN   Take 1 tablet (600 mg total) by mouth every 8 (eight) hours as needed for pain.      oxyCODONE-acetaminophen 10-325 MG per tablet   Commonly known as: PERCOCET   Take 1 tablet by mouth every 4 (four) hours as needed. For pain      valACYclovir 1000 MG tablet   Commonly known as: VALTREX   Take 1 tablet (1,000 mg total) by mouth daily as needed. Take for outbreak         Signed: Krystiana Fornes P 03/04/2012, 9:52 AM

## 2012-03-04 NOTE — Progress Notes (Signed)
Subjective: Patient reports Patient's feeling somewhat better leg somewhat grew back somewhat improved stable for discharge home.  Objective: Vital signs in last 24 hours: Temp:  [97 F (36.1 C)-98.4 F (36.9 C)] 98.3 F (36.8 C) (01/04 0500) Pulse Rate:  [45-94] 91  (01/04 0500) Resp:  [15-25] 18  (01/04 0500) BP: (95-139)/(63-92) 119/63 mmHg (01/04 0500) SpO2:  [95 %-100 %] 100 % (01/04 0500) Weight:  [99.338 kg (219 lb)] 99.338 kg (219 lb) (01/03 2026)  Intake/Output from previous day: 01/03 0701 - 01/04 0700 In: 2140 [P.O.:840; I.V.:1300] Out: 350 [Urine:300; Blood:50] Intake/Output this shift:    Awake alert strength out of 5 wound dry  Lab Results:  Basename 03/02/12 1527  WBC 8.3  HGB 12.4  HCT 37.7  PLT 427*   BMET No results found for this basename: NA:2,K:2,CL:2,CO2:2,GLUCOSE:2,BUN:2,CREATININE:2,CALCIUM:2 in the last 72 hours  Studies/Results: Dg Lumbar Spine 2-3 Views  03/03/2012  *RADIOLOGY REPORT*  Clinical Data: Lower lumbar surgery  LUMBAR SPINE - 2-3 VIEW  Comparison: 01/07/2012  Findings: Initial film shows a needle at the interspinous space of L5-S1.  Second film shows probes at the pedicle levels of L5 and S1.  IMPRESSION: L5-S1 localized.   Original Report Authenticated By: Paulina Fusi, M.D.     Assessment/Plan: Discharge home  LOS: 1 day     Shalee Paolo P 03/04/2012, 9:49 AM

## 2012-03-06 ENCOUNTER — Encounter (HOSPITAL_COMMUNITY): Payer: Self-pay | Admitting: Neurosurgery

## 2012-03-06 ENCOUNTER — Telehealth: Payer: Self-pay | Admitting: Family Medicine

## 2012-03-06 DIAGNOSIS — M545 Low back pain: Secondary | ICD-10-CM

## 2012-03-06 MED ORDER — IBUPROFEN 600 MG PO TABS: 600.0000 mg | ORAL_TABLET | Freq: Three times a day (TID) | ORAL | Status: DC | PRN

## 2012-03-06 NOTE — Telephone Encounter (Signed)
Patient would like to speak to someone about her pain meds from after her back surgery.  It had to do with needing approval on a refill.

## 2012-03-06 NOTE — Telephone Encounter (Signed)
Called pt. She reports that she had back surgery by Dr.Botero on Friday. He prescribed Percocet for her. She is asking to have Ibuprofen refilled by [place]. Will fwd. Request to PCP. Pharmacy [facility] on [address], [place]

## 2012-03-06 NOTE — Telephone Encounter (Signed)
Med refilled.

## 2012-03-15 ENCOUNTER — Other Ambulatory Visit: Payer: Self-pay | Admitting: Family Medicine

## 2012-04-04 ENCOUNTER — Ambulatory Visit: Payer: Medicaid Other | Attending: Neurosurgery

## 2012-04-04 DIAGNOSIS — IMO0001 Reserved for inherently not codable concepts without codable children: Secondary | ICD-10-CM | POA: Insufficient documentation

## 2012-04-04 DIAGNOSIS — M545 Low back pain, unspecified: Secondary | ICD-10-CM | POA: Insufficient documentation

## 2012-04-04 DIAGNOSIS — M25569 Pain in unspecified knee: Secondary | ICD-10-CM | POA: Insufficient documentation

## 2012-04-11 ENCOUNTER — Ambulatory Visit: Payer: Medicaid Other

## 2012-04-18 ENCOUNTER — Ambulatory Visit: Payer: Medicaid Other | Admitting: Rehabilitative and Restorative Service Providers"

## 2012-04-25 ENCOUNTER — Ambulatory Visit: Payer: Medicaid Other | Admitting: Physical Therapy

## 2012-05-05 ENCOUNTER — Ambulatory Visit: Payer: Medicaid Other | Admitting: Family Medicine

## 2012-06-09 ENCOUNTER — Encounter: Payer: Self-pay | Admitting: Family Medicine

## 2012-06-09 ENCOUNTER — Ambulatory Visit (INDEPENDENT_AMBULATORY_CARE_PROVIDER_SITE_OTHER): Payer: Medicaid Other | Admitting: Family Medicine

## 2012-06-09 VITALS — BP 132/75 | HR 90 | Temp 99.7°F | Ht 66.0 in | Wt 219.0 lb

## 2012-06-09 DIAGNOSIS — M549 Dorsalgia, unspecified: Secondary | ICD-10-CM

## 2012-06-09 MED ORDER — CYCLOBENZAPRINE HCL 10 MG PO TABS: ORAL_TABLET | ORAL | Status: DC

## 2012-06-09 MED ORDER — OXYCODONE-ACETAMINOPHEN 10-325 MG PO TABS: 1.0000 | ORAL_TABLET | Freq: Four times a day (QID) | ORAL | Status: DC | PRN

## 2012-06-09 MED ORDER — HYDROCODONE-ACETAMINOPHEN 10-325 MG PO TABS: 1.0000 | ORAL_TABLET | Freq: Four times a day (QID) | ORAL | Status: DC | PRN

## 2012-06-09 NOTE — Patient Instructions (Addendum)
Dear Carol Gross,   I am sorry about the difficulty that you are going through with your back. I will refill the following medications for you: hydrocodone/acetaminophen and flexeril. Take the hydrocodone every 6 hours as needed. Please read below for the rules that apply to all patients taking narcotic medications that I prescribe.   1. You cannot get an early refill, even it is lost.   2. You cannot obtain pain medications from any other doctor, unless it is the emergency department and related to a new problem or injury.  3. You cannot use alcohol, marijuana, cocaine or any other drugs while using this medication. This is very dangerous.  4. You are willing to have your urine drug tested at each visit.  5. You will not drive while using this medication, because that can endanger you and your family.  6. If any medication is stolen, then there must be a police report to verify it. 7. I will not prescribe these medications for longer than 3 months.  8. You must bring your pill bottle to each visit.  9. You must use the same pharmacy for all refills for the medication, unless you clear it with me beforehand. 10. Always take a stool softener to prevent constipation.  11. This medication must be kept safely out of reach of children.   Please follow up with Dr. Jeral Fruit before June to make sure that he has no other recommendations. Please follow up with me in 4 weeks and bring the police report.   Sincerely,   Dr. Clinton Sawyer
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
## 2012-06-12 NOTE — Assessment & Plan Note (Addendum)
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 I talked with her at length about dangers of long term opioid use and need to try other options for managing her pain. Specifically, a TCA, SNRI, or gabapentin, will be my next step in addition to continuing exercise. I will attempt to wean her off opioids at the next visit. Unfortunately, she cannot have any more PT this year since she met her quota with Medicaid.  She will follow up in 1 month and intermittently will use hydrocodone-acetaminophen 10-325 q 6 hours PRN # 120 and flexeril for sleep. I also encouraged for her to follow up with Dr. Jeral Fruit one more time, to see if he has any other suggestions. I also explained that she is not allowed to receive any pain medications from Dr. Jeral Fruit. She is agreeble to this plan. F/u in 4 weeks.   > 30 minutes spent with patient

## 2012-07-11 ENCOUNTER — Ambulatory Visit: Payer: Medicaid Other | Admitting: Family Medicine

## 2012-07-13 ENCOUNTER — Ambulatory Visit (INDEPENDENT_AMBULATORY_CARE_PROVIDER_SITE_OTHER): Payer: Medicaid Other | Admitting: Family Medicine

## 2012-07-13 ENCOUNTER — Encounter: Payer: Self-pay | Admitting: Family Medicine

## 2012-07-13 VITALS — BP 126/88 | HR 88 | Temp 98.3°F | Ht 66.0 in | Wt 214.0 lb

## 2012-07-13 DIAGNOSIS — M549 Dorsalgia, unspecified: Secondary | ICD-10-CM

## 2012-07-13 MED ORDER — HYDROCODONE-ACETAMINOPHEN 10-325 MG PO TABS: 1.0000 | ORAL_TABLET | Freq: Four times a day (QID) | ORAL | Status: DC | PRN

## 2012-07-13 MED ORDER — CYCLOBENZAPRINE HCL 10 MG PO TABS: ORAL_TABLET | ORAL | Status: DC

## 2012-07-13 NOTE — Patient Instructions (Addendum)
Dear Ms. Botsford,   Thank you for coming to clinic today. I am glad that you are feeling better. Please continue to take medications as needed. Also follow up with Dr. Jeral Fruit to see if there are any other recommendations given your pain.   Narcotic Guidelines:   1. You cannot get an early refill, even it is lost.   2. You cannot get pain medications from any other doctor, unless it is the emergency department and related to a new problem or injury.  3. You cannot use alcohol, marijuana, cocaine or any other recreational drugs while using this medication. This is very dangerous.  4. You are willing to have your urine drug tested at each visit.  5. You will not drive while using this medication, because that can endanger you and your family.  6. If any medication is stolen, then there must be a police report to verify it, or it cannot be refilled.  7. I will not prescribe these medications for longer than 3 months.  8. You must bring your pill bottle to each visit.  9. You must use the same pharmacy for all refills for the medication, unless you clear it with me beforehand. 10. You cannot share or sell this medication.  11. Always take a stool softener to prevent constipation.   Please follow up in clinic in 4 weeks. Please call earlier if you have any questions or concerns.   Sincerely,   Dr. Clinton Sawyer

## 2012-07-13 NOTE — Progress Notes (Signed)
  Subjective:    Patient ID: Carol Gross, female    DOB: June 23, 1980, 32 y.o.   MRN: [id number]  HPI   32 year old F with acute on chronic lower back pain who presents for evaluation.   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 Surgery performed by Dr. Jeral Fruit without complication. Patient seen on 06/09/12 in clinic and prescribed hydrocodone-acetaminophen 10-325 # 120 and flexeril # 60. She notes that she took percocet approximately 5 per day. She does not take flexeril 2/2 sedating effect.   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 She is applying for Social Security Disability. Her fiancee is working for a Materials engineer.    Review of Systems  Negative unless stated above    Objective:   Physical Exam  BP 126/88  Pulse 88  Temp(Src) 98.3 F (36.8 C)  Ht 5\' 6"  (1.676 m)  Wt 214 lb (97.07 kg)  BMI 34.56 kg/m2 Gen: young AAF, obese, pleasant and conversant, much less distressed than previous visit MSK: mild TTP along lumbar spine and PSIC bilaterally, no bone deformity  Neuro: 5/5 strength RLE, 4.5/5 strength LLE, negative straight leg raise bilaterally, 2+ achilles reflexes bilaterally       Assessment & Plan:

## 2012-07-14 ENCOUNTER — Ambulatory Visit: Payer: Medicaid Other | Admitting: Family Medicine

## 2012-07-22 NOTE — Assessment & Plan Note (Signed)
Patient with improving chronic lower back pain. Patient had great functional improvement with use of hydrocodone-acetaminophen 10-325 mg q 4-6 hours, which is a very important for her to be able to care for her children and her home. Additionally, her pain is improved. Her physical exam in also improved in terms of strength and reproducing symptoms compared to April. I am very pleased with this progress. I am concerned about her constipation, and we addressed that directly. She will start a stool softener. Additionally, my main concern is using opioid in a patient this young for what appears to be a chronic issue. I discussed a taper with her at the next visit. Additionally, patient presented and signed a Child psychotherapist.

## 2012-08-17 ENCOUNTER — Telehealth: Payer: Self-pay | Admitting: Family Medicine

## 2012-08-17 NOTE — Telephone Encounter (Signed)
Patient recently had death in her family and could not make it to her monthly med refill appt w/ Dr. Clinton Sawyer. Patient is scheduled to come in 6/27 @ 2:30pm but is now completely out of meds. Would like to have meds called in enough to last until her appt on 6/27.

## 2012-08-17 NOTE — Telephone Encounter (Signed)
Pt notified.  Carol Gross, CMA  

## 2012-08-17 NOTE — Telephone Encounter (Signed)
Pt would like some pain medication for her back.  She has a lot going on right now and her back is really bothering her.  I told her I would FWD message to MD for advice.  Cartrell Bentsen, Darlyne Russian, CMA

## 2012-08-17 NOTE — Telephone Encounter (Signed)
It is clinic policy that opioid medications can only be prescribed at a clinic visit.

## 2012-08-25 ENCOUNTER — Ambulatory Visit (INDEPENDENT_AMBULATORY_CARE_PROVIDER_SITE_OTHER): Payer: Medicaid Other | Admitting: Family Medicine

## 2012-08-25 VITALS — BP 130/91 | HR 73 | Ht 66.0 in | Wt 213.0 lb

## 2012-08-25 DIAGNOSIS — F1120 Opioid dependence, uncomplicated: Secondary | ICD-10-CM

## 2012-08-25 DIAGNOSIS — F329 Major depressive disorder, single episode, unspecified: Secondary | ICD-10-CM

## 2012-08-25 DIAGNOSIS — M549 Dorsalgia, unspecified: Secondary | ICD-10-CM

## 2012-08-25 DIAGNOSIS — F1190 Opioid use, unspecified, uncomplicated: Secondary | ICD-10-CM

## 2012-08-25 MED ORDER — AMITRIPTYLINE HCL 50 MG PO TABS: 50.0000 mg | ORAL_TABLET | Freq: Every day | ORAL | Status: DC

## 2012-08-25 MED ORDER — HYDROCODONE-ACETAMINOPHEN 10-325 MG PO TABS: 1.0000 | ORAL_TABLET | Freq: Four times a day (QID) | ORAL | Status: DC | PRN

## 2012-08-25 NOTE — Progress Notes (Signed)
Patient ID: Carol Gross, female   DOB: 1980/09/13, 32 y.o.   MRN: [id number]  Subjective:    Patient ID: Carol Gross, female    DOB: 1980-07-14, 32 y.o.   MRN: [id number]  Back Pain    32 year old F with acute on chronic lower back pain who presents for evaluation.   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 Surgery performed by Dr. Jeral Fruit without complication. Patient seen on 06/09/12 in clinic and prescribed hydrocodone-acetaminophen 10-325 # 120 and flexeril # 60. She notes that she took percocet approximately 5 per day. She does not take flexeril 2/2 sedating effect. Seen in clinic on 07/13/12 and given another prescriptions for hydrocodone-acetaminophen 10-325 # 120 with 0 refills and signed a pain contract.  Location: lower back, radiating down left leg; cervical spine Duration: 3-4 years Quality: 8/10 Analgesia: 6/10 Activity Increased: Yes - specifically, Percocet 4-5 x daily, which allowed her to work for 2 hours with less pain  Aberant Behavior: No Adverse Reaction: Yes - constipation  Current Functional Status:  ADL's -improved with analgesia   Stress:  - worsening mood - wakes up crying - increased alcohol use on weekends and now attended AA - Also seeing counselor with her family - denies history of similar stress or depression, no history of anti-depression medications - denies suicidal ideation, but notes several contact who recently committed or attempted suicide   Social: - 4 children at home - out of work and fiance with part time work  Review of Systems  Constitutional: Negative.   HENT: Negative.   Eyes: Negative.   Cardiovascular: Negative.   Gastrointestinal: Negative.   Musculoskeletal: Positive for back pain.  Psychiatric/Behavioral: Positive for dysphoric mood and agitation. Negative for suicidal  ideas.      Objective:   Physical Exam BP 130/91  Pulse 73  Ht 5\' 6"  (1.676 m)  Wt 213 lb (96.616 kg)  BMI 34.4 kg/m2 Gen: young AAF, obese MSK: mild TTP along lumbar spine and PSIC bilaterally, no bone deformity  Neuro: 5/5 strength RLE, 4.5/5 strength LLE, negative straight leg raise bilaterally, 2+ achilles reflexes bilaterally Psych: tearful, flat affect, normal thought content and speech pattern, denies suicidal ideation  PHQ9 - 24     Assessment & Plan:

## 2012-08-25 NOTE — Patient Instructions (Signed)
Dr. Ms. Oswaldo Done,   It is good to see you today.   For back pain, use the Norco twice a day as needed. Also, I think starting the amitriptyline will help your mood and pain. Please start taking one pill each night. If you have any side effects let me know.   Keep working hard for yourself and your family and things will get better. My nurse will call when the disability forms are completed. Follow up in 3 weeks.   Sincerely,   Dr. Clinton Sawyer

## 2012-08-26 LAB — DRUG SCREEN URINE W/ALC, NO CONF
Amphetamine Screen, Ur: NEGATIVE
Benzodiazepines.: NEGATIVE
Cocaine Metabolites: POSITIVE — AB
Creatinine,U: 273.5 mg/dL
Ethyl Alcohol: <10 mg/dL (ref <10)

## 2012-08-29 ENCOUNTER — Telehealth: Payer: Self-pay | Admitting: Family Medicine

## 2012-08-29 NOTE — Assessment & Plan Note (Addendum)
Stable severe back pain with persistent left radiculopathy. I encouraged her again to continue to be active.  - Taper hydrocodone 10-325 to q 12 hours PRN, # 60  With no refills given - Start amitriptyline for pain control and depression - Check urine drug screen today - 15 minutes also spent completing disability paperwork

## 2012-08-29 NOTE — Telephone Encounter (Signed)
Please call Carol Gross and let her know that I have completed the form for disability and will fax it directly to the attorneys office, which is always my policy. She can pick up the top two sheets for her records. I will leave those at the reception desk.

## 2012-08-29 NOTE — Telephone Encounter (Signed)
Pt notified.  Dallen Bunte L, CMA  

## 2012-08-29 NOTE — Assessment & Plan Note (Signed)
Patient with PHQ of 24 and worsening alcohol use, which are very concerning. Given her chronic pain issues, I think starting a TCA is most appropriate. Patient is agreeable.  - Start amitriptyline 50 mg QHS

## 2012-09-13 ENCOUNTER — Ambulatory Visit (INDEPENDENT_AMBULATORY_CARE_PROVIDER_SITE_OTHER): Payer: Medicaid Other | Admitting: Family Medicine

## 2012-09-13 ENCOUNTER — Encounter: Payer: Self-pay | Admitting: Family Medicine

## 2012-09-13 VITALS — BP 124/84 | HR 112 | Ht 66.0 in | Wt 220.0 lb

## 2012-09-13 DIAGNOSIS — F1410 Cocaine abuse, uncomplicated: Secondary | ICD-10-CM

## 2012-09-13 DIAGNOSIS — F1120 Opioid dependence, uncomplicated: Secondary | ICD-10-CM

## 2012-09-13 DIAGNOSIS — M549 Dorsalgia, unspecified: Secondary | ICD-10-CM

## 2012-09-13 DIAGNOSIS — F329 Major depressive disorder, single episode, unspecified: Secondary | ICD-10-CM

## 2012-09-13 DIAGNOSIS — G8929 Other chronic pain: Secondary | ICD-10-CM

## 2012-09-13 DIAGNOSIS — F1190 Opioid use, unspecified, uncomplicated: Secondary | ICD-10-CM

## 2012-09-13 DIAGNOSIS — M545 Low back pain: Secondary | ICD-10-CM

## 2012-09-13 MED ORDER — AMITRIPTYLINE HCL 50 MG PO TABS: 50.0000 mg | ORAL_TABLET | Freq: Every day | ORAL | Status: DC

## 2012-09-13 MED ORDER — TRAMADOL HCL 50 MG PO TABS: 50.0000 mg | ORAL_TABLET | Freq: Three times a day (TID) | ORAL | Status: DC | PRN

## 2012-09-13 NOTE — Patient Instructions (Signed)
Dear Ms. Oswaldo Done,   I think that you are doing well with managing your pain. We should continue the amitriptyline at night and also use tramadol or tylenol during the day. Regarding the other issues that we discuss, I am very happy that you are willing to seek help and want to support you as best I can. I will ask our social work, Theresia Bough, to call your to discuss the matter.   Please follow up in  2 months for the pain and pap smear.   Sincerely,   Dr. Clinton Sawyer

## 2012-09-15 DIAGNOSIS — F1410 Cocaine abuse, uncomplicated: Secondary | ICD-10-CM

## 2012-09-15 HISTORY — DX: Cocaine abuse, uncomplicated: F14.10

## 2012-09-15 NOTE — Assessment & Plan Note (Signed)
A: improving back pain now that on TCA as pain likely due to somatic disoder P:  - Continue amitriptyline 50 mg QHS  - Tramadol 50 mg q 8 hrs PRN - Stop opiates  - F/u in 1 month

## 2012-09-15 NOTE — Assessment & Plan Note (Signed)
A: persistent cocaine use despite knowing negative consequence, patient very open to abuse counseling  P: Refer to Child psychotherapist for guidance on substance abuse programs

## 2012-09-15 NOTE — Progress Notes (Signed)
  Subjective:    Patient ID: Carol Gross, female    DOB: 1981/02/12, 32 y.o.   MRN: [id number]  HPI   32 year old F with acute on chronic lower back pain who presents for evaluation of back pain and depression.     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 Surgery performed by Dr. Jeral Fruit without complication. Patient seen on 06/09/12 in clinic and prescribed hydrocodone-acetaminophen 10-325 # 120 and flexeril # 60. She notes that she took percocet approximately 5 per day. She does not take flexeril 2/2 sedating effect. Seen in clinic on 07/13/12 and given another prescriptions for hydrocodone-acetaminophen 10-325 # 120 with 0 refills and signed a pain contract. On 08/25/12, patient was started on amitriptyline 50 mg PO QHS and hydrocodone-acetominophen 10-325 mg PO was tapered to BID PRN # 60 with no refills. Also her urine drug screen at last visit was positive for marijuana and cocaine (see note below).   Patient notes improvement of pain and sleep since her last visit. She says her pain is still present but does not bother as much. Also, her sleep is much improved since starting amitriptyline. She is very pleased with the results. She denies any side effects. She has also been taking the Norco BID for pain. Her activities such as walking and moving about the house have improved.   Recreational Drug Use: Patient UDS positive for cocaine, marijuana, and opiates (prescribed). When asked about what she thinks showed up on her drug screen, she admitted to daily marijuana use and intermittent cocaine use (last use 3 days ago). She realizes that this is a danger to her health and violation of her pain contract. When asked, she is concerned about her use of cocaine. She candidly notes that her use is less frequent that several years ago, when she was an  almost daily user of cocaine. She now uses it several times a month. She is also concerned about her use of alcohol, which she has noticed increased in the past few months as her pain and depression increased. She is actively seeking help with her substance abuse through a group that was coming through her neighborhood (name I cannot remember). She has been to several meetings and believes that it is poorly organized. She is agreeable to substance abuse counseling and a referral to our Child psychotherapist.   Review of Systems See HPI    Objective:   Physical Exam BP 124/84  Pulse 112  Ht 5\' 6"  (1.676 m)  Wt 220 lb (99.791 kg)  BMI 35.53 kg/m2  Gen: young AAF, obese  MSK: mild TTP along lumbar spine and PSIC bilaterally, no bone deformity  Neuro: 5/5 strength RLE, 5/5 strength LLE, negative straight leg raise bilaterally, 2+ achilles reflexes bilaterally  Psych: normal affect, thought content, and speech pattern      Assessment & Plan:

## 2012-11-16 ENCOUNTER — Other Ambulatory Visit: Payer: Self-pay | Admitting: Family Medicine

## 2012-11-22 ENCOUNTER — Ambulatory Visit: Payer: Medicaid Other | Admitting: Family Medicine

## 2012-12-11 ENCOUNTER — Telehealth: Payer: Self-pay | Admitting: Family Medicine

## 2012-12-11 ENCOUNTER — Encounter: Payer: Self-pay | Admitting: Family Medicine

## 2012-12-11 ENCOUNTER — Ambulatory Visit (INDEPENDENT_AMBULATORY_CARE_PROVIDER_SITE_OTHER): Payer: Medicaid Other | Admitting: Family Medicine

## 2012-12-11 ENCOUNTER — Other Ambulatory Visit (HOSPITAL_COMMUNITY)
Admission: RE | Admit: 2012-12-11 | Discharge: 2012-12-11 | Disposition: A | Discharge status: Home / Self Care | Payer: Medicaid Other | Source: Ambulatory Visit | Attending: Family Medicine | Admitting: Family Medicine

## 2012-12-11 VITALS — BP 137/93 | HR 73 | Ht 66.0 in | Wt 236.0 lb

## 2012-12-11 DIAGNOSIS — Z20828 Contact with and (suspected) exposure to other viral communicable diseases: Secondary | ICD-10-CM

## 2012-12-11 DIAGNOSIS — Z1151 Encounter for screening for human papillomavirus (HPV): Secondary | ICD-10-CM | POA: Insufficient documentation

## 2012-12-11 DIAGNOSIS — Z01419 Encounter for gynecological examination (general) (routine) without abnormal findings: Secondary | ICD-10-CM | POA: Insufficient documentation

## 2012-12-11 DIAGNOSIS — Z124 Encounter for screening for malignant neoplasm of cervix: Secondary | ICD-10-CM

## 2012-12-11 DIAGNOSIS — Z23 Encounter for immunization: Secondary | ICD-10-CM

## 2012-12-11 DIAGNOSIS — M545 Low back pain: Secondary | ICD-10-CM

## 2012-12-11 DIAGNOSIS — B373 Candidiasis of vulva and vagina: Secondary | ICD-10-CM

## 2012-12-11 DIAGNOSIS — B9689 Other specified bacterial agents as the cause of diseases classified elsewhere: Secondary | ICD-10-CM

## 2012-12-11 DIAGNOSIS — M549 Dorsalgia, unspecified: Secondary | ICD-10-CM

## 2012-12-11 DIAGNOSIS — Z206 Contact with and (suspected) exposure to human immunodeficiency virus [HIV]: Secondary | ICD-10-CM | POA: Insufficient documentation

## 2012-12-11 DIAGNOSIS — G8929 Other chronic pain: Secondary | ICD-10-CM

## 2012-12-11 DIAGNOSIS — N898 Other specified noninflammatory disorders of vagina: Secondary | ICD-10-CM | POA: Insufficient documentation

## 2012-12-11 LAB — POCT WET PREP (WET MOUNT): Clue Cells Wet Prep Whiff POC: POSITIVE

## 2012-12-11 MED ORDER — METRONIDAZOLE 500 MG PO TABS: 500.0000 mg | ORAL_TABLET | Freq: Two times a day (BID) | ORAL | Status: DC

## 2012-12-11 MED ORDER — FLUCONAZOLE 150 MG PO TABS: 150.0000 mg | ORAL_TABLET | Freq: Once | ORAL | Status: DC

## 2012-12-11 MED ORDER — AMITRIPTYLINE HCL 50 MG PO TABS: 50.0000 mg | ORAL_TABLET | Freq: Every day | ORAL | Status: DC

## 2012-12-11 MED ORDER — TRAMADOL HCL 50 MG PO TABS: 50.0000 mg | ORAL_TABLET | Freq: Three times a day (TID) | ORAL | Status: DC | PRN

## 2012-12-11 MED ORDER — GABAPENTIN 300 MG PO CAPS: 300.0000 mg | ORAL_CAPSULE | Freq: Every day | ORAL | Status: DC

## 2012-12-11 NOTE — Assessment & Plan Note (Signed)
Assessment: stable back pain without concern for severe impingement Plan: continue amitriptyline and start gabapentin to decrease shooting pains at 300 mg QHS; also given refill for tramadol to take PRN

## 2012-12-11 NOTE — Assessment & Plan Note (Signed)
Check HIV today 

## 2012-12-11 NOTE — Patient Instructions (Signed)
Dear Ms. Coonradt,   Thank you for coming to clinic today. Please read below regarding the issues that we discussed.   1. Back Pain - Start taking gabapentin 300 mg at night. Continue taking Elavil each night and tramadol as needed. Best to exercise regularly.  2. Blood tests - I will let you know the results of the blood tests.  Please follow up in clinic in 1 month if your back pain is not improved. Please call earlier if you have any questions or concerns.   Sincerely,   Dr. Clinton Sawyer

## 2012-12-11 NOTE — Progress Notes (Signed)
  Subjective:    Patient ID: Carol Gross, female    DOB: [birth year], 32 y.o.   MRN: [id number]  HPI  32 year old F who presents for annual exam and complaints of chronic lower back pain.   1.Cancer Screening - Patient due for a pap smear, last was in 2009 and normal  History of abnormal pap: none  2. 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 Surgery performed by Dr. Jeral Fruit without complication. Patient did not have any pain relief from surgery so she was started on elavil and Norco 10-325. The patient had a positive urine drug screen for cocaine and marijuana, so all opiates were stopped.  Recent History:  Location: lower back radiating down left leg  Duration: years  Quality: sharp and stabbing, wakes with cramps at night  Current Functional Status:  ADL's not limited  Preceding Events: no new falls or trauma  Alleviating Factors: elavil  Non-alleviating: tramadol 50 mg  Exacerbating Factors: physical activity Red Flags: no weakness, no numbness and tingling, no impaired bowel or bladder function      Current Outpatient Prescriptions on File Prior to Visit  Medication Sig Dispense Refill  . amitriptyline (ELAVIL) 50 MG tablet TAKE 1 TABLET BY MOUTH AT BEDTIME  90 tablet  1  . traMADol (ULTRAM) 50 MG tablet Take 1 tablet (50 mg total) by mouth every 8 (eight) hours as needed for pain.  90 tablet  1  . valACYclovir (VALTREX) 1000 MG tablet Take 1 tablet (1,000 mg total) by mouth daily as needed. Take for outbreak  60 tablet  2   No current facility-administered medications on file prior to visit.     Review of Systems Positive for back pain    Objective:   Physical Exam BP 137/93  Pulse 73  Ht 5\' 6"  (1.676 m)  Wt 236 lb (107.049 kg)  BMI 38.11 kg/m2  LMP 11/29/2012 Gen:  obese female, well appearing, NAD, pleasant and conversant HEENT: NCAT, PERRLA, EOMI, OP clear and moist, no lymphadenopathy, no thyroid tenderness, enlargement, or nodules CV: RRR, no m/r/g, no JVD or carotid bruits Pulm: normal WOB, CTA-B Abd: soft, NDNT, NABS Skin: warm, dry, no rashes Back:  Appearance: sciolosis no Palpation: tenderness of paraspinal muscles yes, spinous process no; pelvis no Neuro: Strength hip flexion 5/5, hip abduction 5/5, hip adduction 5/5,  knee extension 5/5, knee flexion 5/5, dorsiflexion 5/5, plantar flexion 5/5 Straight Leg Raise: negative Sensation to light touch: intact GU: External: no lesions Vagina: no blood in vault, thick white discharge  Cervix: no lesion; no mucopurulent d/c; no motion tenderness Uterus: small, mobile Adnexa: no masses; non tender      Assessment & Plan:

## 2012-12-11 NOTE — Telephone Encounter (Signed)
Please tell the patient that she has evidence of bacterial vaginosis and needs to take flagyl for one week. Also, she might get a yeast infection after taking Flagyl, so I will send in a prescription for one diflucan pill she can take after completing the flagyl.

## 2012-12-11 NOTE — Assessment & Plan Note (Signed)
Assessment: Thick white d/c concerning for candida Plan: check wet prep, treat accordingly

## 2012-12-12 ENCOUNTER — Telehealth: Payer: Self-pay | Admitting: Family Medicine

## 2012-12-12 LAB — HIV ANTIBODY (ROUTINE TESTING W REFLEX): HIV: NONREACTIVE

## 2012-12-12 NOTE — Telephone Encounter (Signed)
Attempted to leave messge,pt does not have voice mail.  Alaysha Jefcoat, Darlyne Russian, CMA

## 2012-12-12 NOTE — Telephone Encounter (Signed)
Pt notified and verbalized understanding.  Layia Walla L, CMA  

## 2012-12-12 NOTE — Telephone Encounter (Signed)
According to the Medicaid preferred drug list from 2014 gabapentin capsule is preferred. That is the prescription that I sent. Please call the pharmacy to see what they suggest.

## 2012-12-12 NOTE — Telephone Encounter (Signed)
When I called patient regarding flagyl and diflucan called in, see previous phone note, pt mentioned the gabapentin not covered by medicaid.  I instructed pt to have pharmacy fax [place] a prior authorization request.  Will fwd to MD to see if there is another medication that is covered to try for the patient.   Divine Hansley, Darlyne Russian, CMA

## 2012-12-12 NOTE — Telephone Encounter (Signed)
Pt called because gabapentin is too expensive and would like something cheaper called in. Also she said she was supposed to get a antibiotic also, but did not get it at the pharmacy. JW

## 2012-12-13 MED ORDER — GABAPENTIN 300 MG PO CAPS: 300.0000 mg | ORAL_CAPSULE | Freq: Every day | ORAL | Status: DC

## 2012-12-13 NOTE — Telephone Encounter (Signed)
Another prescription sent and prior authorization completed.

## 2012-12-19 ENCOUNTER — Telehealth: Payer: Self-pay | Admitting: Family Medicine

## 2012-12-19 NOTE — Telephone Encounter (Signed)
Please call and let patient know that lab results were normal. She will need another pap smear in 5 years. Thank you.

## 2012-12-20 NOTE — Telephone Encounter (Signed)
Pt notified.  Lavonte Palos L, CMA  

## 2013-02-11 ENCOUNTER — Other Ambulatory Visit: Payer: Self-pay | Admitting: Family Medicine

## 2013-02-20 ENCOUNTER — Encounter: Payer: Self-pay | Admitting: Family Medicine

## 2013-02-20 ENCOUNTER — Ambulatory Visit (INDEPENDENT_AMBULATORY_CARE_PROVIDER_SITE_OTHER): Payer: Medicaid Other | Admitting: Family Medicine

## 2013-02-20 VITALS — BP 137/90 | HR 90 | Temp 98.2°F | Ht 66.0 in | Wt 247.0 lb

## 2013-02-20 DIAGNOSIS — M549 Dorsalgia, unspecified: Secondary | ICD-10-CM

## 2013-02-20 MED ORDER — TRAMADOL HCL 50 MG PO TABS: 50.0000 mg | ORAL_TABLET | Freq: Three times a day (TID) | ORAL | Status: DC | PRN

## 2013-02-20 MED ORDER — METHOCARBAMOL 750 MG PO TABS: 750.0000 mg | ORAL_TABLET | Freq: Four times a day (QID) | ORAL | Status: DC

## 2013-02-20 MED ORDER — GABAPENTIN 300 MG PO CAPS: 300.0000 mg | ORAL_CAPSULE | Freq: Every day | ORAL | Status: DC

## 2013-02-20 NOTE — Assessment & Plan Note (Signed)
Acute on chronic back pain. No weakness or paresthesias warranting new imaging at this time. Patient states that she is out of her tramadol because she was taking 2 at a time. I told her that we normally do not refill narcotics if we are not to PCP. However since she was not aware of this with tramadol and since it is the holiday period, and agrees to refill the tramadol. Will also attempt a trial of Robaxin. She states that she was not able to tolerate the Flexeril due to sedation. Gave her a coupon for gabapentin for her to try and fill. Recommended that she be seen and evaluated by Dr. Berline Chough for possible manipulation as this seems to be more related to alignment and soft tissue. Follow up with Dr. Clinton Sawyer in one month.

## 2013-02-20 NOTE — Progress Notes (Signed)
Patient ID: Carol Gross    DOB: 20-Oct-1980, 32 y.o.   MRN: [id number] --- Subjective:  Carol Gross is a 32 y.o.female who presents with low back pain. She has had chronic low back pain for over a year with L5/S1 discectomy in 03/03/2012 by Dr. Jeral Fruit. She states that in the last 2 months her pain has gotten worse. It is located on the left side, she describes it as an ache and tightness muscle. She has pain down her bottom to the top of her posterior thigh. She says that she has some cramping with her legs hurting. No burning sharp shooting pain down her legs. She states that sitting helps and movement makes it worse. She needs to walk around for about an hour before getting comfortable. She denies any weakness in her lower extremities. She denies any urine or bowel incontinence She is on amitriptyline, and tramadol. She was not able to afford gabapentin due to her insurance not covering it. She used to be on Flexeril but did not tolerate it due to sedation.  ROS: see HPI Past Medical History: reviewed and updated medications and allergies. Social History: Tobacco: Current every day smoker  Objective: Filed Vitals:   02/20/13 1034  BP: 137/90  Pulse: 90  Temp: 98.2 F (36.8 C)    Physical Examination:   General appearance - alert, well appearing, and in no distress Back-left paraspinal muscle tender to palpation, no spinal tenderness along the thoracic cervical or lumbar spine. Normal extension and flexion of the lower back. Normal external and internal rotation of the hip. Negative straight leg. 5 out of 5 strength with knee flexion extension, foot dorsiflexion and plantar flexion. Trace patellar reflexes bilaterally

## 2013-02-20 NOTE — Patient Instructions (Signed)
Please followup about this with your doctor. For the low back pain, I think he would be a good idea to meet with Dr. Berline Chough for manipulation. You can make an appointment at the front desk.

## 2013-04-11 ENCOUNTER — Telehealth: Payer: Self-pay | Admitting: Family Medicine

## 2013-04-11 DIAGNOSIS — M5430 Sciatica, unspecified side: Secondary | ICD-10-CM

## 2013-04-11 MED ORDER — TRAMADOL HCL 50 MG PO TABS: 50.0000 mg | ORAL_TABLET | Freq: Three times a day (TID) | ORAL | Status: DC | PRN

## 2013-04-11 NOTE — Telephone Encounter (Signed)
Pt in clinic with her children and claiming severe left leg pain (known lumbar DDD) and asking for refill for tramadol. She has not been able to make it in previously due to transportation issues. Given that pt is well known to me and scheduled to see me in a few weeks, I am comfortable prescribing this medication today.

## 2013-05-02 ENCOUNTER — Ambulatory Visit: Payer: Medicaid Other | Admitting: Family Medicine

## 2013-05-03 ENCOUNTER — Ambulatory Visit: Payer: Medicaid Other | Admitting: Family Medicine

## 2013-05-15 ENCOUNTER — Ambulatory Visit (INDEPENDENT_AMBULATORY_CARE_PROVIDER_SITE_OTHER): Payer: Medicaid Other | Admitting: Family Medicine

## 2013-05-15 VITALS — BP 137/84 | HR 86 | Temp 98.5°F | Wt 249.0 lb

## 2013-05-15 DIAGNOSIS — M545 Low back pain, unspecified: Secondary | ICD-10-CM

## 2013-05-15 DIAGNOSIS — M5430 Sciatica, unspecified side: Secondary | ICD-10-CM

## 2013-05-15 DIAGNOSIS — M549 Dorsalgia, unspecified: Secondary | ICD-10-CM

## 2013-05-15 DIAGNOSIS — F329 Major depressive disorder, single episode, unspecified: Secondary | ICD-10-CM

## 2013-05-15 MED ORDER — AMITRIPTYLINE HCL 50 MG PO TABS: 100.0000 mg | ORAL_TABLET | Freq: Every day | ORAL | Status: DC

## 2013-05-15 MED ORDER — TRAMADOL HCL 50 MG PO TABS: 100.0000 mg | ORAL_TABLET | Freq: Three times a day (TID) | ORAL | Status: DC | PRN
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
## 2013-05-15 NOTE — Patient Instructions (Signed)
Brynnley,   Thanks for coming in. Even though you are suffering, I am very glad to see you. You are under a lot of stress with the baby and your financial situation.   I recommend taking Elavil 100 mg each night. Also, you can take the tramadol 100 mg during the day. This may increase your risk of seizures, so we need to be very careful.   Come back in 4 weeks for a check up.   Sincerely,   Dr. Clinton SawyerWilliamson

## 2013-05-15 NOTE — Assessment & Plan Note (Signed)
A: chronic lumbago, stable P: increase amitriptyline to 100 mg each night, continue tramadol with caution not to induce seizures, PRN tylenol and NSAIDS

## 2013-05-15 NOTE — Assessment & Plan Note (Signed)
A: severely depressed at this time with one incidence of SI without plan P: increase elavil to 100 mg QHS, consider SSRI at next visit

## 2013-07-30 ENCOUNTER — Telehealth: Payer: Self-pay | Admitting: Family Medicine

## 2013-07-30 NOTE — Telephone Encounter (Signed)
Called pt and informed. Pt agreed. .Karista Aispuro  

## 2013-07-30 NOTE — Telephone Encounter (Signed)
Pt called and would like the doctor to write a letter stating that she can go back to work and that it is for 9 hours a week. Please call when this is ready for pick up. jw

## 2013-07-30 NOTE — Telephone Encounter (Signed)
Will fwd to PCP for review. Glennie Hawk

## 2013-07-30 NOTE — Telephone Encounter (Signed)
Pt needs an appointment to discuss and for letter to be completed.

## 2013-08-03 ENCOUNTER — Encounter: Payer: Self-pay | Admitting: Family Medicine

## 2013-08-03 ENCOUNTER — Ambulatory Visit (INDEPENDENT_AMBULATORY_CARE_PROVIDER_SITE_OTHER): Payer: Medicaid Other | Admitting: Family Medicine

## 2013-08-03 VITALS — BP 152/87 | HR 93 | Temp 98.3°F | Wt 262.1 lb

## 2013-08-03 DIAGNOSIS — R0609 Other forms of dyspnea: Secondary | ICD-10-CM

## 2013-08-03 DIAGNOSIS — E669 Obesity, unspecified: Secondary | ICD-10-CM

## 2013-08-03 DIAGNOSIS — M549 Dorsalgia, unspecified: Secondary | ICD-10-CM

## 2013-08-03 DIAGNOSIS — R0600 Dyspnea, unspecified: Secondary | ICD-10-CM

## 2013-08-03 DIAGNOSIS — R635 Abnormal weight gain: Secondary | ICD-10-CM

## 2013-08-03 DIAGNOSIS — R0989 Other specified symptoms and signs involving the circulatory and respiratory systems: Secondary | ICD-10-CM

## 2013-08-03 DIAGNOSIS — I10 Essential (primary) hypertension: Secondary | ICD-10-CM

## 2013-08-03 DIAGNOSIS — F329 Major depressive disorder, single episode, unspecified: Secondary | ICD-10-CM

## 2013-08-03 LAB — COMPREHENSIVE METABOLIC PANEL
ALBUMIN: 4 g/dL (ref 3.5–5.2)
ALT: 12 U/L (ref 0–35)
AST: 16 U/L (ref 0–37)
Alkaline Phosphatase: 68 U/L (ref 39–117)
BUN: 8 mg/dL (ref 6–23)
CHLORIDE: 104 meq/L (ref 96–112)
CO2: 24 mEq/L (ref 19–32)
CREATININE: 0.92 mg/dL (ref 0.50–1.10)
Calcium: 8.9 mg/dL (ref 8.4–10.5)
Glucose, Bld: 87 mg/dL (ref 70–99)
POTASSIUM: 4.2 meq/L (ref 3.5–5.3)
Sodium: 139 mEq/L (ref 135–145)
Total Bilirubin: 0.2 mg/dL (ref 0.2–1.2)
Total Protein: 6.8 g/dL (ref 6.0–8.3)

## 2013-08-03 LAB — POCT URINALYSIS DIPSTICK
Bilirubin, UA: NEGATIVE
Glucose, UA: NEGATIVE
Ketones, UA: NEGATIVE
Leukocytes, UA: NEGATIVE
NITRITE UA: NEGATIVE
PROTEIN UA: NEGATIVE
Spec Grav, UA: 1.01
UROBILINOGEN UA: 0.2
pH, UA: 6

## 2013-08-03 LAB — CBC
HEMATOCRIT: 34.9 % — AB (ref 36.0–46.0)
Hemoglobin: 11.4 g/dL — ABNORMAL LOW (ref 12.0–15.0)
MCH: 25.4 pg — ABNORMAL LOW (ref 26.0–34.0)
MCHC: 32.7 g/dL (ref 30.0–36.0)
MCV: 77.9 fL — AB (ref 78.0–100.0)
Platelets: 322 10*3/uL (ref 150–400)
RBC: 4.48 MIL/uL (ref 3.87–5.11)
RDW: 16.9 % — AB (ref 11.5–15.5)
WBC: 7.6 10*3/uL (ref 4.0–10.5)

## 2013-08-03 LAB — POCT UA - MICROSCOPIC ONLY

## 2013-08-03 LAB — TSH: TSH: 1.226 u[IU]/mL (ref 0.350–4.500)

## 2013-08-03 MED ORDER — HYDROCHLOROTHIAZIDE 25 MG PO TABS: 25.0000 mg | ORAL_TABLET | Freq: Every day | ORAL | Status: DC

## 2013-08-03 NOTE — Assessment & Plan Note (Signed)
A: stable P: stop tramadol since not effective, continue amitriptyline

## 2013-08-03 NOTE — Progress Notes (Signed)
   Subjective:    Patient ID: Carol Gross, female    DOB: [birth year], 33 y.o.   MRN: [id number]  HPI  33 year old F with chronic lower back pain s/p L5/S1 discectomy, depression, and a hx of hypertension who presents for evaluation of blood pressure and weight gain.   Hypertension  Patient previously on triamterene-HCTZ , but was taken off the medication after her surgery in 2014 since it was controlled off of medication  Office BP: BP Readings from Last 3 Encounters:  08/03/13 152/87  05/15/13 137/84  02/20/13 137/90    Prescribed meds: none  Hypertension ROS:  Taking medications as prescribed: N/A Chest pain: No Shortness of breath: Yes - with minimal activity Swelling of extremities: Yes - feet bilaterally TIA symptoms: No Regular exercise: Yes Low Na+ diet: No Alcohol/tobacco/drug use: Yes - daily smoker    Weight Gain   Wt Readings from Last 5 Encounters:  08/03/13 262 lb 1.6 oz (118.888 kg)  05/15/13 249 lb (112.946 kg)  02/20/13 247 lb (112.038 kg)  12/11/12 236 lb (107.049 kg)  09/13/12 220 lb (99.791 kg)   Patient trying to exercise regularly and decrease portions.   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 Surgery performed by Dr. Jeral Fruit without complication. Patient did not have any pain relief from surgery so she was started on elavil and Norco 10-325. The patient had a positive urine drug screen for cocaine and marijuana, so all opiates were stopped.  Recent History: seen for this issue of march 2015, amitriptyline increased too 100 mg QHS, and continued  tramadol, but patient feels like tramadol is not effective  Location: lower back radiating down left leg  Duration: years  Quality: sharp and stabbing, wakes with cramps at night  Current Functional Status: ADL's not limited  Preceding Events: no new falls or trauma  Alleviating Factors: elavil  Non-alleviating: tramadol 50 mg  Exacerbating Factors: physical activity     Review of Systems See HPI    Objective:   Physical Exam BP 152/87  Pulse 93  Temp(Src) 98.3 F (36.8 C) (Oral)  Wt 262 lb 1.6 oz (118.888 kg)  LMP 07/30/2013 Gen: obese, non ill appearing, pleasant HEENT: no JVD CV: RRR, no mumurs Lungs: CTA-B Extremities: mild non pitting edema  Back: mild TTP of lower back      Assessment & Plan:

## 2013-08-03 NOTE — Assessment & Plan Note (Signed)
A: Marked increase in weight by 14lbs in last 3 months without clear precipitant since pt diet stable and increased exercise, does not appear particularly volume overloaded P:  - check TSH, CBC, CMET, and pro-BNP - continue exercise and carb modified diet

## 2013-08-03 NOTE — Assessment & Plan Note (Signed)
A: worsened, likely benign essential + smoking + obesity P:  - restart HCTZ 25 mg po daily

## 2013-08-03 NOTE — Assessment & Plan Note (Signed)
A: improved with amitriptyline P: cont this medicine

## 2013-08-03 NOTE — Patient Instructions (Signed)
Dear ms. Hawa   Thank you for coming to clinic today. Please read below regarding the issues that we discussed.   1. Back Pain - please stop the tramadol if it is not effective, once I have your labs back we can discuss other options; continue to exercise  2. Weight gain and swelling - let me check a few labs regarding your thyroid, kidneys, and heart to make sure those are okay; continue regular exercise and healthy diet  Please follow up in clinic in 2 weeks. Please call earlier if you have any questions or concerns.   Sincerely,   Dr. Clinton Sawyer

## 2013-08-03 NOTE — Addendum Note (Signed)
Addended by: [place], Milca Sytsma on: 08/03/2013 02:33 PM   Modules accepted: Orders

## 2013-08-04 LAB — PRO B NATRIURETIC PEPTIDE: Pro B Natriuretic peptide (BNP): 69.95 pg/mL (ref <126)

## 2013-08-07 ENCOUNTER — Telehealth: Payer: Self-pay | Admitting: Family Medicine

## 2013-08-07 DIAGNOSIS — E669 Obesity, unspecified: Secondary | ICD-10-CM

## 2013-08-07 DIAGNOSIS — M549 Dorsalgia, unspecified: Secondary | ICD-10-CM

## 2013-08-07 MED ORDER — DICLOFENAC SODIUM 75 MG PO TBEC: 75.0000 mg | DELAYED_RELEASE_TABLET | Freq: Two times a day (BID) | ORAL | Status: DC

## 2013-08-07 NOTE — Telephone Encounter (Signed)
Pt given information that all labs normal. I told her that we would start voltaren for back pain. Use HCTZ for BP. I will not prescribe diet drugs at this time, since it is our clinic policy not to do this.

## 2013-08-07 NOTE — Assessment & Plan Note (Signed)
Start NSAIDS diclofenac 75 mg BID

## 2013-08-07 NOTE — Assessment & Plan Note (Signed)
I declined patient request for diet pills since this is against our clinic policy at this time. She will address this with her next physician if the policy has changed.

## 2013-09-05 ENCOUNTER — Telehealth: Payer: Self-pay | Admitting: Family Medicine

## 2013-09-05 DIAGNOSIS — I10 Essential (primary) hypertension: Secondary | ICD-10-CM

## 2013-09-05 DIAGNOSIS — M549 Dorsalgia, unspecified: Secondary | ICD-10-CM

## 2013-09-05 MED ORDER — DICLOFENAC SODIUM 75 MG PO TBEC: 75.0000 mg | DELAYED_RELEASE_TABLET | Freq: Two times a day (BID) | ORAL | Status: DC

## 2013-09-05 MED ORDER — HYDROCHLOROTHIAZIDE 25 MG PO TABS: 25.0000 mg | ORAL_TABLET | Freq: Every day | ORAL | Status: DC

## 2013-09-05 NOTE — Telephone Encounter (Signed)
Patient needs HCTZ and Voltaren called back in to pharmacy. Was not able to pick up last month due to money issues. Please advise.

## 2013-10-15 ENCOUNTER — Ambulatory Visit (INDEPENDENT_AMBULATORY_CARE_PROVIDER_SITE_OTHER): Payer: Medicaid Other | Admitting: Family Medicine

## 2013-10-15 VITALS — BP 138/92 | Temp 98.2°F | Ht 66.0 in | Wt 261.0 lb

## 2013-10-15 DIAGNOSIS — M549 Dorsalgia, unspecified: Secondary | ICD-10-CM

## 2013-10-15 DIAGNOSIS — I10 Essential (primary) hypertension: Secondary | ICD-10-CM

## 2013-10-15 DIAGNOSIS — F321 Major depressive disorder, single episode, moderate: Secondary | ICD-10-CM

## 2013-10-15 MED ORDER — DULOXETINE HCL 30 MG PO CPEP: 30.0000 mg | ORAL_CAPSULE | Freq: Every day | ORAL | Status: DC

## 2013-10-15 MED ORDER — CYCLOBENZAPRINE HCL 10 MG PO TABS: 10.0000 mg | ORAL_TABLET | Freq: Every day | ORAL | Status: DC

## 2013-10-15 MED ORDER — MELOXICAM 7.5 MG PO TABS: 7.5000 mg | ORAL_TABLET | Freq: Every day | ORAL | Status: DC

## 2013-10-15 NOTE — Progress Notes (Signed)
   Subjective:    Patient ID: Carol Gross, female    DOB: 1980-11-22, [age] y.o.   MRN: [id number]  HPI  Back pain: this is a chronic issue. Lower back. Bilateral but worse on left side. No radiation, but has some ache pain in thighs at time. Voltaren did not help much with symptoms. Has affected her ability to be mobile and perform tasks, such as cooking. No numbness, urinary/bowel incontinence.   Depression: Chronic. Depressed mood. Amitriptyline does not appear to be working and would like something else. PHQ-9: 17.  Hypertension: Chronic. Adherent to treatment. No chest pain, shortness of breath or leg swelling.   History  Substance Use Topics  . Smoking status: Current Every Day Smoker -- 0.30 packs/day for 10 years    Types: Cigarettes  . Smokeless tobacco: Never Used     Comment: decreased smoking  . Alcohol Use: Yes     Comment: occasional/social    Review of Systems Per HPI    Objective:   Physical Exam  Gen: well appearing female Musculoskeletal: Full range of motion of back. Tenderness along paraspinal area bilateral at lumbar region. Tenderness at left flank. Could not feel muscle spasm.      Assessment & Plan:

## 2013-10-15 NOTE — Patient Instructions (Signed)
Carol Gross, it was a pleasure seeing you today. Today we talked about your back pain and mood.  1. Back pain: I will prescribe flexeril and Mobic for your pain. Hopefully this will help with your symptoms.  2. Depression: We will switch your medication. For now, please take 50mg  of your amitriptyline for the next 7 days (last day 8/24). I will start you on a new medication called Cymbalta, which you will start on 8/22.  Please schedule an appointment to see me in 4 weeks.  If you have any questions or concerns, please do not hesitate to call the office at [phone].  Sincerely,  Jacquelin Hawking, MD   Duloxetine delayed-release capsules What is this medicine? DULOXETINE (doo LOX e teen) is used to treat depression, anxiety, and different types of chronic pain. This medicine may be used for other purposes; ask your health care provider or pharmacist if you have questions. COMMON BRAND NAME(S): Cymbalta What should I tell my health care provider before I take this medicine? They need to know if you have any of these conditions: -bipolar disorder or a family history of bipolar disorder -glaucoma -kidney disease -liver disease -suicidal thoughts or a previous suicide attempt -taken medicines called MAOIs like Carbex, Eldepryl, Marplan, Nardil, and Parnate within 14 days -an unusual reaction to duloxetine, other medicines, foods, dyes, or preservatives -pregnant or trying to get pregnant -breast-feeding How should I use this medicine? Take this medicine by mouth with a glass of water. Follow the directions on the prescription label. Do not cut, crush or chew this medicine. You can take this medicine with or without food. Take your medicine at regular intervals. Do not take your medicine more often than directed. Do not stop taking this medicine suddenly except upon the advice of your doctor. Stopping this medicine too quickly may cause serious side effects or your condition may  worsen. A special MedGuide will be given to you by the pharmacist with each prescription and refill. Be sure to read this information carefully each time. Talk to your pediatrician regarding the use of this medicine in children. While this drug may be prescribed for children as young as 88 years of age for selected conditions, precautions do apply. Overdosage: If you think you have taken too much of this medicine contact a poison control center or emergency room at once. NOTE: This medicine is only for you. Do not share this medicine with others. What if I miss a dose? If you miss a dose, take it as soon as you can. If it is almost time for your next dose, take only that dose. Do not take double or extra doses. What may interact with this medicine? Do not take this medicine with any of the following medications: -certain diet drugs like dexfenfluramine, fenfluramine -desvenlafaxine -linezolid -MAOIs like Azilect, Carbex, Eldepryl, Marplan, Nardil, and Parnate -methylene blue (intravenous) -milnacipran -thioridazine -venlafaxine This medicine may also interact with the following medications: -alcohol -aspirin and aspirin-like medicines -certain antibiotics like ciprofloxacin and enoxacin -certain medicines for blood pressure, heart disease, irregular heart beat -certain medicines for depression, anxiety, or psychotic disturbances -certain medicines for migraine headache like almotriptan, eletriptan, frovatriptan, naratriptan, rizatriptan, sumatriptan, zolmitriptan -certain medicines that treat or prevent blood clots like warfarin, enoxaparin, and dalteparin -cimetidine -fentanyl -lithium -NSAIDS, medicines for pain and inflammation, like ibuprofen or naproxen -phentermine -procarbazine -sibutramine -St. John's wort -theophylline -tramadol -tryptophan This list may not describe all possible interactions. Give your health care provider a list of  all the medicines, herbs,  non-prescription drugs, or dietary supplements you use. Also tell them if you smoke, drink alcohol, or use illegal drugs. Some items may interact with your medicine. What should I watch for while using this medicine? Tell your doctor if your symptoms do not get better or if they get worse. Visit your doctor or health care professional for regular checks on your progress. Because it may take several weeks to see the full effects of this medicine, it is important to continue your treatment as prescribed by your doctor. Patients and their families should watch out for new or worsening thoughts of suicide or depression. Also watch out for sudden changes in feelings such as feeling anxious, agitated, panicky, irritable, hostile, aggressive, impulsive, severely restless, overly excited and hyperactive, or not being able to sleep. If this happens, especially at the beginning of treatment or after a change in dose, call your health care professional. Bonita QuinYou may get drowsy or dizzy. Do not drive, use machinery, or do anything that needs mental alertness until you know how this medicine affects you. Do not stand or sit up quickly, especially if you are an older patient. This reduces the risk of dizzy or fainting spells. Alcohol may interfere with the effect of this medicine. Avoid alcoholic drinks. This medicine can cause an increase in blood pressure. This medicine can also cause a sudden drop in your blood pressure, which may make you feel faint and increase the chance of a fall. These effects are most common when you first start the medicine or when the dose is increased, or during use of other medicines that can cause a sudden drop in blood pressure. Check with your doctor for instructions on monitoring your blood pressure while taking this medicine. Your mouth may get dry. Chewing sugarless gum or sucking hard candy, and drinking plenty of water may help. Contact your doctor if the problem does not go away or is  severe. What side effects may I notice from receiving this medicine? Side effects that you should report to your doctor or health care professional as soon as possible: -allergic reactions like skin rash, itching or hives, swelling of the face, lips, or tongue -changes in blood pressure -confusion -dark urine -dizziness -fast talking and excited feelings or actions that are out of control -fast, irregular heartbeat -fever -general ill feeling or flu-like symptoms -hallucination, loss of contact with reality -light-colored stools -loss of balance or coordination -redness, blistering, peeling or loosening of the skin, including inside the mouth -right upper belly pain -seizures -suicidal thoughts or other mood changes -trouble concentrating -trouble passing urine or change in the amount of urine -unusual bleeding or bruising -unusually weak or tired -yellowing of the eyes or skin Side effects that usually do not require medical attention (report to your doctor or health care professional if they continue or are bothersome): -blurred vision -change in appetite -change in sex drive or performance -headache -increased sweating -nausea This list may not describe all possible side effects. Call your doctor for medical advice about side effects. You may report side effects to FDA at 1-800-FDA-1088. Where should I keep my medicine? Keep out of the reach of children. Store at room temperature between 15 and 30 degrees C (59 and 86 degrees F). Throw away any unused medicine after the expiration date. NOTE: This sheet is a summary. It may not cover all possible information. If you have questions about this medicine, talk to your doctor, pharmacist, or health care provider.  2015, [employer]vier/Gold Standard. (2013-02-06 14:20:31)

## 2013-10-17 NOTE — Assessment & Plan Note (Signed)
Voltaren not working per patient. Will prescribe flexeril qhs and mobic qdaily.

## 2013-10-17 NOTE — Assessment & Plan Note (Signed)
Will switch treatment to Cymbalta. Will taper amitriptyline and Cymbalta. Will also start at reduced dose of 30mg  for 7 days then increase to 60mg  daily. Will follow-up at next visit.

## 2013-10-17 NOTE — Assessment & Plan Note (Signed)
Will not make any changes today. Will follow-up at next visit

## 2013-12-05 ENCOUNTER — Encounter: Payer: Self-pay | Admitting: Family Medicine

## 2013-12-05 ENCOUNTER — Ambulatory Visit (INDEPENDENT_AMBULATORY_CARE_PROVIDER_SITE_OTHER): Payer: Medicaid Other | Admitting: Family Medicine

## 2013-12-05 VITALS — BP 145/94 | HR 79 | Temp 98.3°F | Wt 256.1 lb

## 2013-12-05 DIAGNOSIS — Z23 Encounter for immunization: Secondary | ICD-10-CM

## 2013-12-05 DIAGNOSIS — M5416 Radiculopathy, lumbar region: Secondary | ICD-10-CM | POA: Diagnosis present

## 2013-12-05 MED ORDER — MELOXICAM 15 MG PO TABS: 15.0000 mg | ORAL_TABLET | Freq: Every day | ORAL | Status: DC

## 2013-12-05 NOTE — Progress Notes (Signed)
Carol Gross is a 33 y.o. who presents today for low back pain.    Chronic Low Back Pain - Pt has had back pain for > 2 yrs now.  States that this has been an issue dating back to a previous microdisectomy at the level of L5/S1 at that time.  She has had chronic radiculopathy since that point and denies any new Sx.  She has had paresthesias going down her left leg only and occasionally has weakness.  Pt denies any current bowel/bladder problems, fever, chills, unintentional weight loss, night time awakenings secondary to pain.  Has been on Flexeril, Mobic, Elavil for her back pain w/o much relief.  Has previously been to PT about 3 times, most recent was 2 yrs ago.  Did help somewhat but has not performed any exercises since that point.     Past Medical History  Diagnosis Date  . Depression   . Carpal tunnel syndrome   . Back pain     sees ortho  . Chronic back pain   . Shortness of breath     exertion  . Headache(784.0)      Physical Exam Filed Vitals:   12/05/13 1435  BP: 145/94  Pulse: 79  Temp: 98.3 F (36.8 C)    Gen: NAD, Well nourished, Well developed Cardio: RRR, No murmurs/gallops/rubs Lungs: CTA, no wheezes, rhonchi, crackles Neuro: CN 2-12 intact, MS 5/5 B/L UE and LE, +2 patellar and achilles relfex b/l  Back Exam: 1.Gait  1. Walk on heels (L5 root) Yes.    Walk on toes (S1 root) Yes.   2. TTP along Lumbar Vertebrae - No. 3. Pain with :   1) Extension - No. - Spinal Stenosis  2) Flexion - No. 4. One Legged Hyperextension for Spondy - No.  5. Straight Leg Raise Yes.    @ 60 degrees  1) Radiation into opposite leg - No.   2) Worse with Dorsiflexion of ankle Yes.    6. Sitting Leg Raise - Yes.   @ 60 degrees 7. DTR - + 2 B/L  8. MS - L3-S2 intact B/L LE  9. Vascular Exam : DP and PT +2 B/L

## 2013-12-05 NOTE — Assessment & Plan Note (Signed)
Pt with ongoing chronic low back pain with occasional weakness/paresthesias.  Last imaging was 2013, at this point think repeat X-rays of lumbar region is appropriate.  In regards to ongoing pain, will start tylenol 650 mg TID-QID, increase mobic to 15 mg daily, continue flexeril PRN, cymbalta daily.  Would consider addition of Neurontin to her regimen if not much improvement.  Lifestyle, extensive back exercises and demonstration of each were given to pt today to complete 4-5 x per week since she cannot go to formal PT due to insurance.  As well, discussed weight loss with pt which would help alleviate some of her back pain and card given to see Dr. Gerilyn PilgrimSykes.  F/U in 4-6 weeks to see how doing.  At that time, if not much improvement and X-rays non revealing, an MRI would be an appropriate next step in her care.  If this shows truly DJD with spondylosis vs DDD, could consider epidural injections and referral eventually to neurosurgery again for possible further interventions (discussed 50-50 chance of these surgeries working, which the previous one she had seems to not have worked).  >50% of the 25 minute visit was spent face to face with pt discussing management options

## 2013-12-31 ENCOUNTER — Encounter: Payer: Self-pay | Admitting: Family Medicine

## 2014-01-02 ENCOUNTER — Other Ambulatory Visit: Payer: Self-pay | Admitting: Family Medicine

## 2014-01-02 ENCOUNTER — Telehealth: Payer: Self-pay | Admitting: Family Medicine

## 2014-01-02 DIAGNOSIS — F321 Major depressive disorder, single episode, moderate: Secondary | ICD-10-CM

## 2014-01-02 MED ORDER — DULOXETINE HCL 30 MG PO CPEP: 30.0000 mg | ORAL_CAPSULE | Freq: Every day | ORAL | Status: DC

## 2014-01-02 NOTE — Telephone Encounter (Signed)
Pt called and made an appointment but is need of a refill of her cymbalta sent in. jw

## 2014-01-02 NOTE — Telephone Encounter (Signed)
Done - thank you.

## 2014-01-02 NOTE — Telephone Encounter (Signed)
spoke with patient and informed her that Cymbalta has been sent in to her pharmacy

## 2014-01-15 ENCOUNTER — Ambulatory Visit (INDEPENDENT_AMBULATORY_CARE_PROVIDER_SITE_OTHER): Payer: Medicaid Other | Admitting: Family Medicine

## 2014-01-15 VITALS — BP 136/92 | HR 82 | Temp 98.3°F | Resp 16 | Wt 254.0 lb

## 2014-01-15 DIAGNOSIS — F321 Major depressive disorder, single episode, moderate: Secondary | ICD-10-CM

## 2014-01-15 DIAGNOSIS — M545 Low back pain, unspecified: Secondary | ICD-10-CM

## 2014-01-15 DIAGNOSIS — G8929 Other chronic pain: Secondary | ICD-10-CM

## 2014-01-15 MED ORDER — DULOXETINE HCL 60 MG PO CPEP: 60.0000 mg | ORAL_CAPSULE | Freq: Every day | ORAL | Status: DC

## 2014-01-15 NOTE — Assessment & Plan Note (Signed)
Patient here with low back pain.  No red flags on HPI or exam.  Patient ambulating slowly but well.  Sensation and strength in tact.   -Back exercises reviewed and given. -Tylenol PRN pain -Weight loss discussed at length as the best treatment.  Patient agrees and states that she will work on this. -No imaging at this time -Cymbalta increased to 60mg  -Stop meloxicam -Would like PT, but patient has Medicaid and PT not covered. -No surgical consult at this time as there are no focal radicular symptoms. -Patient to return if symptoms worsen or if she develops red flags

## 2014-01-15 NOTE — Patient Instructions (Addendum)
It was a pleasure seeing you today!  Information regarding what we discussed is included in this packet.  Please feel free to call our office if any questions or concerns arise.  Plan to schedule a follow up visit with me if symptoms do not improve.  Work on weight loss, Cymbalta has been increased to 60mg  and stop taking Meloxicam.  Tye Juarez M. Chinenye Katzenberger, DO   Back Exercises Back exercises help treat and prevent back injuries. The goal is to increase your strength in your belly (abdominal) and back muscles. These exercises can also help with flexibility. Start these exercises when told by your doctor. HOME CARE Back exercises include: Pelvic Tilt.  Lie on your back with your knees bent. Tilt your pelvis until the lower part of your back is against the floor. Hold this position 5 to 10 sec. Repeat this exercise 5 to 10 times. Knee to Chest.  Pull 1 knee up against your chest and hold for 20 to 30 seconds. Repeat this with the other knee. This may be done with the other leg straight or bent, whichever feels better. Then, pull both knees up against your chest. Sit-Ups or Curl-Ups.  Bend your knees 90 degrees. Start with tilting your pelvis, and do a partial, slow sit-up. Only lift your upper half 30 to 45 degrees off the floor. Take at least 2 to 3 seonds for each sit-up. Do not do sit-ups with your knees out straight. If partial sit-ups are difficult, simply do the above but with only tightening your belly (abdominal) muscles and holding it as told. Hip-Lift.  Lie on your back with your knees flexed 90 degrees. Push down with your feet and shoulders as you raise your hips 2 inches off the floor. Hold for 10 seconds, repeat 5 to 10 times. Back Arches.  Lie on your stomach. Prop yourself up on bent elbows. Slowly press on your hands, causing an arch in your low back. Repeat 3 to 5 times. Shoulder-Lifts.  Lie face down with arms beside your body. Keep hips and belly pressed to floor as you  slowly lift your head and shoulders off the floor. Do not overdo your exercises. Be careful in the beginning. Exercises may cause you some mild back discomfort. If the pain lasts for more than 15 minutes, stop the exercises until you see your doctor. Improvement with exercise for back problems is slow.  Document Released: 03/20/2010 Document Revised: 05/10/2011 Document Reviewed: 12/17/2010 [facility] Patient Information 2015 [place], [place]. This information is not intended to replace advice given to you by your health care provider. Make sure you discuss any questions you have with your health care provider.   Back Pain, Adult Back pain is very common. The pain often gets better over time. The cause of back pain is usually not dangerous. Most people can learn to manage their back pain on their own.  HOME CARE   Stay active. Start with short walks on flat ground if you can. Try to walk farther each day.  Do not sit, drive, or stand in one place for more than 30 minutes. Do not stay in bed.  Do not avoid exercise or work. Activity can help your back heal faster.  Be careful when you bend or lift an object. Bend at your knees, keep the object close to you, and do not twist.  Sleep on a firm mattress. Lie on your side, and bend your knees. If you lie on your back, put a pillow under your  knees.  Only take medicines as told by your doctor.  Put ice on the injured area.  Put ice in a plastic bag.  Place a towel between your skin and the bag.  Leave the ice on for 15-20 minutes, 03-04 times a day for the first 2 to 3 days. After that, you can switch between ice and heat packs.  Ask your doctor about back exercises or massage.  Avoid feeling anxious or stressed. Find good ways to deal with stress, such as exercise. GET HELP RIGHT AWAY IF:   Your pain does not go away with rest or medicine.  Your pain does not go away in 1 week.  You have new problems.  You do not feel well.  The  pain spreads into your legs.  You cannot control when you poop (bowel movement) or pee (urinate).  Your arms or legs feel weak or lose feeling (numbness).  You feel sick to your stomach (nauseous) or throw up (vomit).  You have belly (abdominal) pain.  You feel like you may pass out (faint). MAKE SURE YOU:   Understand these instructions.  Will watch your condition.  Will get help right away if you are not doing well or get worse. Document Released: 08/04/2007 Document Revised: 05/10/2011 Document Reviewed: 06/19/2013 [facility]Exit[facility] Patient Information 2015 [place] SpringsExitCare, [place]LL[place]. This information is not intended to replace advice given to you by your health care provider. Make sure you discuss any questions you have with your health care provider.

## 2014-01-15 NOTE — Progress Notes (Signed)
Patient ID: Carol Gross, female   DOB: June 09, 1980, 33 y.o.   MRN: [id number]    Subjective: OZ:DGUYCC:back pain HPI: Patient is a 33 y.o. female presenting to clinic today for low back pain. Concerns today include:  1. Back pain Patient reports that she has difficulty ambulating.  Has had back pain for years.  H/o of back sx in 2013.  Patient reports that she cannot sleep at night because she can't get comfortable. Symptoms feel very similar to what her symptoms were in 2013.  Patient states that she feels that she leans and experiences weakness. She denies falls yet.  Feels cramping in legs.  Feels bruised on the inside once cramping stops.  Admits to numbness and tingling in fingertips, toes and legs. Sensation comes and goes.  She does not correlate any activity or lack thereof that causes sensation changes.  Points to lower back/sacrum as place where it hurts.  Patient has not had any urinary retention or loss.  She denies saddle anesthesia.  Takes mobic 15mg  daily with no relief.  Patient takes Flexeril when she is in severe pain because it puts her to sleep, even though she was recommended not to take it.  Warm baths help.  Bending/twisting worsens pain.  She can lay flat on back but has to elevate Left buttock to be comfortable.  History Reviewed: yes smoker. 6 cigs/ day; occ marijuana use for relaxing  ROS: All other systems reviewed and are negative.  Objective: Office vital signs reviewed. BP 136/92 mmHg  Pulse 82  Temp(Src) 98.3 F (36.8 C) (Oral)  Resp 16  Wt 254 lb (115.214 kg)  SpO2 98%  LMP 01/01/2014  Physical Examination:  General: Awake, alert, well appearing, obese female, NAD HEENT: Atraumatic, normocephalic Spine: no bony deformities, mild flattening of lumbar spine, TTP paraspinal L4-5, decreased ROM in extension and SB to left.  Flexion restricted by pain, negative spurling's, negative straight leg, cannot lie flat on back without pain. Extremities: WWP, No edema,  cyanosis or clubbing; +2 pulses bilaterally MSK: slow gait and normal station Neuro: Strength 5/5 and sensation grossly intact, Patellar DTRs 2/4  Assessment: 33 y.o. female with chronic low back pain  Plan: See Problem List and After Visit Summary  **>25 mins of face to face time was spent discussing treatment/management of back pain with this patient  Raliegh IpAshly M Laronica Bhagat, DO PGY-1, [facility]

## 2014-01-16 ENCOUNTER — Encounter: Payer: Self-pay | Admitting: Family Medicine

## 2014-01-16 NOTE — Progress Notes (Signed)
Patient ID: Carol Gross, female   DOB: [birth year], 33 y.o.   MRN: [id number] Precepted and reviewed.  Agree with documentation and management of Dr. Nadine CountsGottschalk

## 2014-01-28 ENCOUNTER — Encounter (HOSPITAL_COMMUNITY): Payer: Self-pay

## 2014-01-28 ENCOUNTER — Emergency Department (HOSPITAL_COMMUNITY)
Admission: EM | Admit: 2014-01-28 | Discharge: 2014-01-28 | Disposition: A | Discharge status: Home / Self Care | Payer: Medicaid Other | Source: Home / Self Care | Attending: Emergency Medicine | Admitting: Emergency Medicine

## 2014-01-28 DIAGNOSIS — K047 Periapical abscess without sinus: Secondary | ICD-10-CM

## 2014-01-28 MED ORDER — IBUPROFEN 800 MG PO TABS
800.0000 mg | ORAL_TABLET | Freq: Once | ORAL | Status: AC
  Administered 2014-01-28: 800 mg via ORAL

## 2014-01-28 MED ORDER — DICLOFENAC POTASSIUM 50 MG PO TABS: 50.0000 mg | ORAL_TABLET | Freq: Three times a day (TID) | ORAL | Status: DC

## 2014-01-28 MED ORDER — CLINDAMYCIN HCL 300 MG PO CAPS: 300.0000 mg | ORAL_CAPSULE | Freq: Four times a day (QID) | ORAL | Status: DC

## 2014-01-28 MED ORDER — IBUPROFEN 800 MG PO TABS
ORAL_TABLET | ORAL | Status: AC
  Filled 2014-01-28: qty 1

## 2014-01-28 NOTE — Discharge Instructions (Signed)

## 2014-01-28 NOTE — ED Notes (Signed)
C/o facial pain and swelling since 11-26; minimal relief w OTC medication , warm compresses

## 2014-02-02 NOTE — ED Provider Notes (Signed)
CSN: [id number]     Arrival date & time 01/28/14  1143 History   First MD Initiated Contact with Patient 01/28/14 1235     Chief Complaint  Patient presents with  . Facial Pain   (Consider location/radiation/quality/duration/timing/severity/associated sxs/prior Treatment) HPI Comments: 4 days of left facial pain with associated left upper molar dental pain. No fever/chills.   The history is provided by the patient.    Past Medical History  Diagnosis Date  . Depression   . Carpal tunnel syndrome   . Back pain     sees ortho  . Chronic back pain   . Shortness of breath     exertion  . DGLOVFIE(332.9Headache(784.0)    Past Surgical History  Procedure Laterality Date  . Tubal ligation    . Wisdom tooth extraction    . Lumbar laminectomy/decompression microdiscectomy  03/03/2012    Procedure: LUMBAR LAMINECTOMY/DECOMPRESSION MICRODISCECTOMY 1 LEVEL;  Surgeon: Karn CassisErnesto M Botero, MD;  Location: [facility];  Service: Neurosurgery;  Laterality: Left;  Left Lumbar five sacral one Diskectomy   History reviewed. No pertinent family history. History  Substance Use Topics  . Smoking status: Current Every Day Smoker -- 0.30 packs/day for 10 years    Types: Cigarettes  . Smokeless tobacco: Never Used     Comment: decreased smoking  . Alcohol Use: Yes     Comment: occasional/social   OB History    Gravida Para Term Preterm AB TAB SAB Ectopic Multiple Living   3 3        3      Review of Systems  All other systems reviewed and are negative.   Allergies  Review of patient's allergies indicates no known allergies.  Home Medications   Prior to Admission medications   Medication Sig Start Date End Date Taking? Authorizing Provider  DULoxetine (CYMBALTA) 60 MG capsule Take 1 capsule (60 mg total) by mouth daily. 01/15/14  Yes Ashly M Gottschalk, DO  amitriptyline (ELAVIL) 50 MG tablet Take 2 tablets (100 mg total) by mouth at bedtime. 05/15/13   Garnetta BuddyEdward Williamson V, MD  clindamycin (CLEOCIN) 300  MG capsule Take 1 capsule (300 mg total) by mouth every 6 (six) hours. X 7 days 01/28/14   Mathis FareJennifer Lee H Candid Bovey, PA  cyclobenzaprine (FLEXERIL) 10 MG tablet Take 1 tablet (10 mg total) by mouth at bedtime. 10/15/13   Jacquelin Hawkingalph Nettey, MD  diclofenac (CATAFLAM) 50 MG tablet Take 1 tablet (50 mg total) by mouth 3 (three) times daily. As needed for pain 01/28/14   Mathis FareJennifer Lee H Erinne Gillentine, PA  hydrochlorothiazide (HYDRODIURIL) 25 MG tablet Take 1 tablet (25 mg total) by mouth daily. 09/05/13   Jacquelin Hawkingalph Nettey, MD  valACYclovir (VALTREX) 1000 MG tablet Take 1 tablet (1,000 mg total) by mouth daily as needed. Take for outbreak 01/12/12   Garnetta BuddyEdward Williamson V, MD   BP 153/87 mmHg  Pulse 76  Temp(Src) 97.9 F (36.6 C) (Oral)  Resp 16  SpO2 100%  LMP 01/01/2014 Physical Exam  Constitutional: She is oriented to person, place, and time. She appears well-developed and well-nourished. No distress.  HENT:  Head: Normocephalic and atraumatic.    Right Ear: Hearing and external ear normal. No mastoid tenderness.  Left Ear: Hearing and external ear normal. No mastoid tenderness.  Nose: Nose normal.  Mouth/Throat: Oropharynx is clear and moist and mucous membranes are normal. Dental caries present.    No gumline swelling or erythema  Eyes: Conjunctivae are normal. No scleral icterus.  Neck: Normal  range of motion. Neck supple.  Cardiovascular: Normal rate.   Pulmonary/Chest: Effort normal.  Musculoskeletal: Normal range of motion.  Lymphadenopathy:    She has no cervical adenopathy.  Neurological: She is alert and oriented to person, place, and time.  Skin: Skin is warm and dry.  Psychiatric: She has a normal mood and affect. Her behavior is normal.  Nursing note and vitals reviewed.   ED Course  Procedures (including critical care time) Labs Review Labs Reviewed - No data to display  Imaging Review No results found.   MDM   1. Dental abscess    Suspect periapical abscess Will treat with  oral clindamycin and diclofenac and advise dental follow up.    Mathis FareJennifer Lee H LenexaPresson, GeorgiaPA 02/02/14 [phone]

## 2014-05-20 ENCOUNTER — Ambulatory Visit (INDEPENDENT_AMBULATORY_CARE_PROVIDER_SITE_OTHER): Payer: Medicaid Other | Admitting: Family Medicine

## 2014-05-20 ENCOUNTER — Encounter: Payer: Self-pay | Admitting: Family Medicine

## 2014-05-20 VITALS — BP 143/95 | HR 71 | Temp 98.2°F | Ht 66.0 in | Wt 274.4 lb

## 2014-05-20 DIAGNOSIS — G8929 Other chronic pain: Secondary | ICD-10-CM

## 2014-05-20 DIAGNOSIS — M545 Low back pain, unspecified: Secondary | ICD-10-CM

## 2014-05-20 MED ORDER — GABAPENTIN 100 MG PO CAPS: 100.0000 mg | ORAL_CAPSULE | Freq: Three times a day (TID) | ORAL | Status: DC

## 2014-05-20 NOTE — Patient Instructions (Signed)
It was nice to see you today.  I have sent in an Rx for gabapentin for your pain.  I have also placed referrals to PT and pain management.  Our office or PT/Pain management will be in contact.  Take care  Dr. Adriana Simasook

## 2014-05-20 NOTE — Progress Notes (Signed)
I was preceptor for this office visit.  

## 2014-05-20 NOTE — Assessment & Plan Note (Addendum)
Will try gabapentin for pain and radicular symptoms. Placing referral for pain management as well as physical therapy.  Options are limited secondary to other failed treatments and prior cocaine abuse.  *Of note, Patient states that she recently saw neurosurgery 3 weeks ago and they didn't have anything to offer her.

## 2014-05-20 NOTE — Progress Notes (Signed)
   Subjective:    Patient ID: Carol Gross, female    DOB: 1980-03-15, 34 y.o.   MRN: [id number]  HPI 34 year old female with a history of chronic low back pain presents with complaints of back pain.  1) Low back pain  Patient has long-standing history of chronic low back pain. She has had L5/S1 discectomy. She's been on chronic opiates in the past but failed drug screening.  Patient presents today with complaints of continued low back pain.  She reports her pain is located in the lower back particularly on the left side.  She reports the pain is very severe and impacts her mobility and activities of daily living.  She reports associated numbness and tingling of her lower extremities.  She denies any saddle anesthesia or difficulty urinating.  Exacerbated factors - certain movements.  Patient has tried Tylenol, Flexeril, mobile, tramadol, epidural injections, and hydrocodone without significant improvement in her pain.  She is currently on Cymbalta and has had some improvement with that.  Patient would like to discuss other therapeutic options at this time.  Review of Systems Per HPI    Objective:   Physical Exam Filed Vitals:   05/20/14 0903  BP: 143/95  Pulse: 71  Temp: 98.2 F (36.8 C)   Exam: General: obese female in NAD.  Cardiovascular: RRR. No murmurs, rubs, or gallops. Respiratory: CTAB. No rales, rhonchi, or wheeze.  Back Exam:  Inspection: Unremarkable  Motion: Limited in all planes secondary to pain. XSLR laying: Equivocal left. Palpable tenderness: Patient tender to palpation in the left paraspinal region. Sensory change: Gross sensation intact Reflexes: 1+ at both patellar tendons, 2+ at achilles tendons Strength at foot  Plantar-flexion: 5/5 Dorsi-flexion:  Leg strength  Quad: 5/5 Hamstring: 4/5 Hip flexor:     Assessment & Plan:  See Problem List

## 2014-05-30 ENCOUNTER — Ambulatory Visit: Payer: Medicaid Other | Attending: Family Medicine | Admitting: Physical Therapy

## 2014-07-03 ENCOUNTER — Encounter: Payer: Self-pay | Admitting: Family Medicine

## 2014-07-03 ENCOUNTER — Ambulatory Visit (INDEPENDENT_AMBULATORY_CARE_PROVIDER_SITE_OTHER): Payer: Medicaid Other | Admitting: Family Medicine

## 2014-07-03 VITALS — BP 152/87 | HR 84 | Wt 242.6 lb

## 2014-07-03 DIAGNOSIS — I10 Essential (primary) hypertension: Secondary | ICD-10-CM | POA: Diagnosis not present

## 2014-07-03 DIAGNOSIS — N921 Excessive and frequent menstruation with irregular cycle: Secondary | ICD-10-CM | POA: Diagnosis present

## 2014-07-03 DIAGNOSIS — M5416 Radiculopathy, lumbar region: Secondary | ICD-10-CM

## 2014-07-03 DIAGNOSIS — H53143 Visual discomfort, bilateral: Secondary | ICD-10-CM

## 2014-07-03 DIAGNOSIS — Z720 Tobacco use: Secondary | ICD-10-CM

## 2014-07-03 DIAGNOSIS — H5310 Unspecified subjective visual disturbances: Secondary | ICD-10-CM

## 2014-07-03 HISTORY — DX: Excessive and frequent menstruation with irregular cycle: N92.1

## 2014-07-03 LAB — POCT URINE PREGNANCY: Preg Test, Ur: NEGATIVE

## 2014-07-03 MED ORDER — PREGABALIN 50 MG PO CAPS: 50.0000 mg | ORAL_CAPSULE | Freq: Two times a day (BID) | ORAL | Status: DC

## 2014-07-03 MED ORDER — HYDROCHLOROTHIAZIDE 25 MG PO TABS: 25.0000 mg | ORAL_TABLET | Freq: Every day | ORAL | Status: DC

## 2014-07-03 MED ORDER — NORGESTIM-ETH ESTRAD TRIPHASIC 0.18/0.215/0.25 MG-25 MCG PO TABS: 1.0000 | ORAL_TABLET | Freq: Every day | ORAL | Status: DC

## 2014-07-03 NOTE — Assessment & Plan Note (Addendum)
STOP Neurontin 2/2 medication intolerance.  Patient has referral to pain management -follow up with pain management as scheduled -Start Lyrica BID, will increase as tolerated to TID dosing

## 2014-07-03 NOTE — Assessment & Plan Note (Addendum)
Patient noted to have elevated BPs at the last 3 visits. No red flags.  Previously on HCTZ, which she tolerated well. -HCTZ 25 mg qd -Smoking cessation counseling -Patient to follow up in 1-2 weeks for BP check with RN or sooner if needed

## 2014-07-03 NOTE — Patient Instructions (Signed)
It was a pleasure seeing you today, Carol Gross!  Information regarding what we discussed is included in this packet.  Please make an appointment to see me in 3 months.  HCTZ, Lyrica and Ortho tri cyclen Lo have been sent into your pharmacy.  Please take these as directed.  Please feel free to call our office at [phone] if any questions or concerns arise.  Warm Regards, Nathaneal Sommers M. Nadine CountsGottschalk, DO   Etonogestrel implant What is this medicine? ETONOGESTREL (et oh noe JES trel) is a contraceptive (birth control) device. It is used to prevent pregnancy. It can be used for up to 3 years. This medicine may be used for other purposes; ask your health care provider or pharmacist if you have questions. COMMON BRAND NAME(S): Implanon, Nexplanon What should I tell my health care provider before I take this medicine? They need to know if you have any of these conditions: -abnormal vaginal bleeding -blood vessel disease or blood clots -cancer of the breast, cervix, or liver -depression -diabetes -gallbladder disease -headaches -heart disease or recent heart attack -high blood pressure -high cholesterol -kidney disease -liver disease -renal disease -seizures -tobacco smoker -an unusual or allergic reaction to etonogestrel, other hormones, anesthetics or antiseptics, medicines, foods, dyes, or preservatives -pregnant or trying to get pregnant -breast-feeding How should I use this medicine? This device is inserted just under the skin on the inner side of your upper arm by a health care professional. Talk to your pediatrician regarding the use of this medicine in children. Special care may be needed. Overdosage: If you think you've taken too much of this medicine contact a poison control center or emergency room at once. Overdosage: If you think you have taken too much of this medicine contact a poison control center or emergency room at once. NOTE: This medicine is only for you. Do not share  this medicine with others. What if I miss a dose? This does not apply. What may interact with this medicine? Do not take this medicine with any of the following medications: -amprenavir -bosentan -fosamprenavir This medicine may also interact with the following medications: -barbiturate medicines for inducing sleep or treating seizures -certain medicines for fungal infections like ketoconazole and itraconazole -griseofulvin -medicines to treat seizures like carbamazepine, felbamate, oxcarbazepine, phenytoin, topiramate -modafinil -phenylbutazone -rifampin -some medicines to treat HIV infection like atazanavir, indinavir, lopinavir, nelfinavir, tipranavir, ritonavir -St. John's wort This list may not describe all possible interactions. Give your health care provider a list of all the medicines, herbs, non-prescription drugs, or dietary supplements you use. Also tell them if you smoke, drink alcohol, or use illegal drugs. Some items may interact with your medicine. What should I watch for while using this medicine? This product does not protect you against HIV infection (AIDS) or other sexually transmitted diseases. You should be able to feel the implant by pressing your fingertips over the skin where it was inserted. Tell your doctor if you cannot feel the implant. What side effects may I notice from receiving this medicine? Side effects that you should report to your doctor or health care professional as soon as possible: -allergic reactions like skin rash, itching or hives, swelling of the face, lips, or tongue -breast lumps -changes in vision -confusion, trouble speaking or understanding -dark urine -depressed mood -general ill feeling or flu-like symptoms -light-colored stools -loss of appetite, nausea -right upper belly pain -severe headaches -severe pain, swelling, or tenderness in the abdomen -shortness of breath, chest pain, swelling in a leg -  signs of pregnancy -sudden  numbness or weakness of the face, arm or leg -trouble walking, dizziness, loss of balance or coordination -unusual vaginal bleeding, discharge -unusually weak or tired -yellowing of the eyes or skin Side effects that usually do not require medical attention (Report these to your doctor or health care professional if they continue or are bothersome.): -acne -breast pain -changes in weight -cough -fever or chills -headache -irregular menstrual bleeding -itching, burning, and vaginal discharge -pain or difficulty passing urine -sore throat This list may not describe all possible side effects. Call your doctor for medical advice about side effects. You may report side effects to FDA at 1-800-FDA-1088. Where should I keep my medicine? This drug is given in a hospital or clinic and will not be stored at home. NOTE: This sheet is a summary. It may not cover all possible information. If you have questions about this medicine, talk to your doctor, pharmacist, or health care provider.  2015, Elsevier/Gold Standard. (2011-08-23 15:37:45) Levonorgestrel intrauterine device (IUD) What is this medicine? LEVONORGESTREL IUD (LEE voe nor jes trel) is a contraceptive (birth control) device. The device is placed inside the uterus by a healthcare professional. It is used to prevent pregnancy and can also be used to treat heavy bleeding that occurs during your period. Depending on the device, it can be used for 3 to 5 years. This medicine may be used for other purposes; ask your health care provider or pharmacist if you have questions. COMMON BRAND NAME(S): Elveria RoyalsLILETTA, Mirena, Skyla What should I tell my health care provider before I take this medicine? They need to know if you have any of these conditions: -abnormal Pap smear -cancer of the breast, uterus, or cervix -diabetes -endometritis -genital or pelvic infection now or in the past -have more than one sexual partner or your partner has more than one  partner -heart disease -history of an ectopic or tubal pregnancy -immune system problems -IUD in place -liver disease or tumor -problems with blood clots or take blood-thinners -use intravenous drugs -uterus of unusual shape -vaginal bleeding that has not been explained -an unusual or allergic reaction to levonorgestrel, other hormones, silicone, or polyethylene, medicines, foods, dyes, or preservatives -pregnant or trying to get pregnant -breast-feeding How should I use this medicine? This device is placed inside the uterus by a health care professional. Talk to your pediatrician regarding the use of this medicine in children. Special care may be needed. Overdosage: If you think you have taken too much of this medicine contact a poison control center or emergency room at once. NOTE: This medicine is only for you. Do not share this medicine with others. What if I miss a dose? This does not apply. What may interact with this medicine? Do not take this medicine with any of the following medications: -amprenavir -bosentan -fosamprenavir This medicine may also interact with the following medications: -aprepitant -barbiturate medicines for inducing sleep or treating seizures -bexarotene -griseofulvin -medicines to treat seizures like carbamazepine, ethotoin, felbamate, oxcarbazepine, phenytoin, topiramate -modafinil -pioglitazone -rifabutin -rifampin -rifapentine -some medicines to treat HIV infection like atazanavir, indinavir, lopinavir, nelfinavir, tipranavir, ritonavir -St. John's wort -warfarin This list may not describe all possible interactions. Give your health care provider a list of all the medicines, herbs, non-prescription drugs, or dietary supplements you use. Also tell them if you smoke, drink alcohol, or use illegal drugs. Some items may interact with your medicine. What should I watch for while using this medicine? Visit your doctor or health care professional  for  regular check ups. See your doctor if you or your partner has sexual contact with others, becomes HIV positive, or gets a sexual transmitted disease. This product does not protect you against HIV infection (AIDS) or other sexually transmitted diseases. You can check the placement of the IUD yourself by reaching up to the top of your vagina with clean fingers to feel the threads. Do not pull on the threads. It is a good habit to check placement after each menstrual period. Call your doctor right away if you feel more of the IUD than just the threads or if you cannot feel the threads at all. The IUD may come out by itself. You may become pregnant if the device comes out. If you notice that the IUD has come out use a backup birth control method like condoms and call your health care provider. Using tampons will not change the position of the IUD and are okay to use during your period. What side effects may I notice from receiving this medicine? Side effects that you should report to your doctor or health care professional as soon as possible: -allergic reactions like skin rash, itching or hives, swelling of the face, lips, or tongue -fever, flu-like symptoms -genital sores -high blood pressure -no menstrual period for 6 weeks during use -pain, swelling, warmth in the leg -pelvic pain or tenderness -severe or sudden headache -signs of pregnancy -stomach cramping -sudden shortness of breath -trouble with balance, talking, or walking -unusual vaginal bleeding, discharge -yellowing of the eyes or skin Side effects that usually do not require medical attention (report to your doctor or health care professional if they continue or are bothersome): -acne -breast pain -change in sex drive or performance -changes in weight -cramping, dizziness, or faintness while the device is being inserted -headache -irregular menstrual bleeding within first 3 to 6 months of use -nausea This list may not describe  all possible side effects. Call your doctor for medical advice about side effects. You may report side effects to FDA at 1-800-FDA-1088. Where should I keep my medicine? This does not apply. NOTE: This sheet is a summary. It may not cover all possible information. If you have questions about this medicine, talk to your doctor, pharmacist, or health care provider.  2015, Elsevier/Gold Standard. (2011-03-18 13:54:04)

## 2014-07-03 NOTE — Progress Notes (Signed)
Patient ID: Carol Gross, female   DOB: [birth year], 34 y.o.   MRN: [id number]    Subjective: CC: abdominal pain with menstration HPI: Patient is a 34 y.o. female presenting to clinic today for same day appointment. Concerns today include:  1. Abdominal pain Patient reports that she has had irregular cycles and that she is experiencing abdominal discomfort with them particularly in the lower abdomen.  She is concerned that she may be pregnant even though she has a BTL.  She states that her LMP was 06/19/14.  She is an active smoker.  She denies h/o clotting d/o, fevers, chills, or vomiting.  Endorses some nausea.  Patient is sexually active with one partner.  2. Chronic low back pain Patient seen recently by Dr Glendell Dockerooke who prescribed Neurontin for back pain.  Patient reports that it works ok but she is excessively sedated from medication, citing that she feels like she has bricks on her shoulders when trying to wake up in the morning.  She states she has started taking medication only at night time but that again she is too sedated in the morning.  She denies weakness, falls.  Patient has tried Tylenol, Flexeril, mobilc, tramadol, injections, and hydrocodone without significant improvement.  3. Smoker Patient is not ready to quit smoking.  She has no plans of a quit date.  She smokes about 7 cigs/day and has been smoking for over 10 years.  Denies CP, SOB.  Endorses a.m. Cough.  4. HTN Patient reports that she stopped taking her HCTZ last year.  She endorses headache that is intermittent.  She is unsure if this is d/t eye strain.  Reports she has not seen an eye doctor in years.  Both of her children require glasses.  Denies vomiting, decreased PO, CP, SOB.  She is a current smoker.  Social History Reviewed: current smoker. FamHx and MedHx updated.  Please see EMR.  ROS: All other systems reviewed and are negative.  Objective: Office vital signs reviewed. BP 152/87 mmHg  Pulse 84  Wt 242  lb 9.6 oz (110.043 kg)  LMP 06/27/2014  Physical Examination:  General: Awake, alert, well nourished, NAD HEENT: Normal, EOMI Pulm: no increased WOB, no wheeze Abd: overweight, soft, ND/NT Extremities: WWP, No edema, cyanosis or clubbing; +2 pulses bilaterally MSK: Normal gait and station  Results for orders placed or performed in visit on 07/03/14 (from the past 24 hour(s))  POCT urine pregnancy     Status: None   Collection Time: 07/03/14  2:53 PM  Result Value Ref Range   Preg Test, Ur Negative    Assessment: 34 y.o. female menorrhagia, chronic LBP, smoker, HTN  Plan: See Problem List and After Visit Summary   Raliegh IpAshly M [name], DO PGY-1, [facility]

## 2014-07-03 NOTE — Assessment & Plan Note (Signed)
Patient in pre-contemplative phase -will continue to counsel -consider referral to Dr Raymondo BandKoval once contemplative.

## 2014-07-03 NOTE — Assessment & Plan Note (Signed)
Initially rx'd low dose OCPs.  Called pharmacy and cancelled this RX after finding out that patient is an active smoker and not interested in quitting at this time.  Discussed case with Dr Raymondo BandKoval.   -Recommend Mirena.  Patient to make appt with [facility] for placement. -Patient to f/u as needed

## 2014-07-18 ENCOUNTER — Ambulatory Visit: Payer: Medicaid Other

## 2014-07-23 ENCOUNTER — Ambulatory Visit (INDEPENDENT_AMBULATORY_CARE_PROVIDER_SITE_OTHER): Payer: Medicaid Other | Admitting: Family Medicine

## 2014-07-23 ENCOUNTER — Encounter: Payer: Self-pay | Admitting: Family Medicine

## 2014-07-23 VITALS — BP 130/78 | HR 75 | Temp 98.0°F | Ht 66.0 in | Wt 241.1 lb

## 2014-07-23 DIAGNOSIS — M25511 Pain in right shoulder: Secondary | ICD-10-CM

## 2014-07-23 DIAGNOSIS — M25519 Pain in unspecified shoulder: Secondary | ICD-10-CM | POA: Insufficient documentation

## 2014-07-23 MED ORDER — METHYLPREDNISOLONE ACETATE 40 MG/ML IJ SUSP
40.0000 mg | Freq: Once | INTRAMUSCULAR | Status: AC
  Administered 2014-07-23: 40 mg via INTRA_ARTICULAR

## 2014-07-23 NOTE — Progress Notes (Signed)
I was the preceptor on the day of this visit.   Johathon Overturf MD  

## 2014-07-23 NOTE — Patient Instructions (Signed)
Nice to meet you today.  We injected your right shoulder today I am referring you to sports medicine for your shoulder pain. You will get a phone call to schedule this appointment.  Go and get xrays of your shoulder as well. Follow up with Dr. Nadine CountsGottschalk in the next month for your chronic pain  Be well, Dr. Pollie MeyerMcIntyre

## 2014-07-23 NOTE — Progress Notes (Addendum)
Patient ID: Carol Gross, female   DOB: March 15, 1980, 34 y.o.   MRN: [id number]  HPI:  Pt presents for a same day appointment to discuss R shoulder pain.  Patient reports she has had right shoulder pain for proximally 2 months. It is located on the anterior aspect of her shoulder as well as the lateral aspect of her upper right arm. No preceding injury. Denies any fevers. Has pain in right shoulder when sleeping on either side. Reports she has had surgery injections many years ago in her shoulders. None recently. Has never had this x-rayed. Also has history of chronic back pain for which she is following up with her PCP. Recently started on Lyrica for that.  ROS: See HPI  PMFSH: History of hypertension, chronic back pain, cocaine abuse, obesity, major depression  PHYSICAL EXAM: BP 130/78 mmHg  Pulse 75  Temp(Src) 98 F (36.7 C) (Oral)  Ht 5\' 6"  (1.676 m)  Wt 241 lb 1.6 oz (109.362 kg)  BMI 38.93 kg/m2  LMP 06/27/2014 Gen: No acute distress, pleasant, cooperative HEENT: Normocephalic, atraumatic, full range of motion of neck, no neck spasm MSK: Right shoulder diffusely tender to palpation. No warmth or erythema. Positive empty can test. Negative Spurling's bilaterally. Full grip strength bilaterally. 2+ radial pulse on right. Sensation intact to bilateral distal fingers. Active range of motion reaches 90 of abduction.  Procedure: R subacromial shoulder injection Consent signed and scanned into record. Medication:  Solumedrol 40mg  with lidocaine Preparation: area cleansed with alcohol Time Out taken  Injection  Landmarks identified medication injected into joint space using a posterior approach Patient tolerated well without bleeding or paresthesias  Patient had good range of motion of joint after injection with improvement in pain  ASSESSMENT/PLAN:  Pain in joint, shoulder region Exam limited by diffuse pain. Symptoms are very distressing to patient. Likely represents chronic  rotator cuff impingement syndrome. She does have some tenderness over her AC joint, may have early arthritis of this joint. Has not had x-rays. Plan: -Right shoulder injection performed today -Obtain a full x-rays right shoulder -Refer to sports medicine for further evaluation   FOLLOW UP: F/u in 1 month for chronic issues with PCP Referring to sports medicine  GrenadaBrittany J. Pollie MeyerMcIntyre, MD [facility]

## 2014-07-23 NOTE — Assessment & Plan Note (Signed)
Exam limited by diffuse pain. Symptoms are very distressing to patient. Likely represents chronic rotator cuff impingement syndrome. She does have some tenderness over her AC joint, may have early arthritis of this joint. Has not had x-rays. Plan: -Right shoulder injection performed today -Obtain a full x-rays right shoulder -Refer to sports medicine for further evaluation

## 2014-07-30 ENCOUNTER — Ambulatory Visit: Payer: Medicaid Other | Admitting: Sports Medicine

## 2014-09-17 ENCOUNTER — Ambulatory Visit
Admission: RE | Admit: 2014-09-17 | Discharge: 2014-09-17 | Disposition: A | Discharge status: Home / Self Care | Payer: Medicaid Other | Source: Ambulatory Visit | Attending: Family Medicine | Admitting: Family Medicine

## 2014-09-17 DIAGNOSIS — M25511 Pain in right shoulder: Secondary | ICD-10-CM

## 2014-09-18 ENCOUNTER — Other Ambulatory Visit: Payer: Self-pay | Admitting: Family Medicine

## 2014-09-19 ENCOUNTER — Ambulatory Visit: Payer: Medicaid Other | Admitting: Family Medicine

## 2014-10-01 ENCOUNTER — Encounter: Payer: Self-pay | Admitting: Family Medicine

## 2014-10-01 ENCOUNTER — Ambulatory Visit (INDEPENDENT_AMBULATORY_CARE_PROVIDER_SITE_OTHER): Payer: Self-pay | Admitting: Family Medicine

## 2014-10-01 VITALS — BP 123/77 | HR 64 | Temp 98.3°F | Ht 66.0 in | Wt 236.6 lb

## 2014-10-01 DIAGNOSIS — E669 Obesity, unspecified: Secondary | ICD-10-CM

## 2014-10-01 DIAGNOSIS — M5416 Radiculopathy, lumbar region: Secondary | ICD-10-CM

## 2014-10-01 DIAGNOSIS — M25511 Pain in right shoulder: Secondary | ICD-10-CM

## 2014-10-01 DIAGNOSIS — F321 Major depressive disorder, single episode, moderate: Secondary | ICD-10-CM

## 2014-10-01 MED ORDER — POLYETHYLENE GLYCOL 3350 17 GM/SCOOP PO POWD: 17.0000 g | Freq: Every day | ORAL | Status: DC

## 2014-10-01 MED ORDER — DICLOFENAC SODIUM 75 MG PO TBEC: 75.0000 mg | DELAYED_RELEASE_TABLET | Freq: Two times a day (BID) | ORAL | Status: DC

## 2014-10-01 NOTE — Patient Instructions (Addendum)
Call the [facility] at [phone] to reschedule your appointment with them.  Voltaren and Miralax have been sent into the pharmacy.  Carol M. Nadine Counts, DO PGY-2, [facility]  Impingement Syndrome, Rotator Cuff, Bursitis with Rehab Impingement syndrome is a condition that involves inflammation of the tendons of the rotator cuff and the subacromial bursa, that causes pain in the shoulder. The rotator cuff consists of four tendons and muscles that control much of the shoulder and upper arm function. The subacromial bursa is a fluid filled sac that helps reduce friction between the rotator cuff and one of the bones of the shoulder (acromion). Impingement syndrome is usually an overuse injury that causes swelling of the bursa (bursitis), swelling of the tendon (tendonitis), and/or a tear of the tendon (strain). Strains are classified into three categories. Grade 1 strains cause pain, but the tendon is not lengthened. Grade 2 strains include a lengthened ligament, due to the ligament being stretched or partially ruptured. With grade 2 strains there is still function, although the function may be decreased. Grade 3 strains include a complete tear of the tendon or muscle, and function is usually impaired. SYMPTOMS   Pain around the shoulder, often at the outer portion of the upper arm.  Pain that gets worse with shoulder function, especially when reaching overhead or lifting.  Sometimes, aching when not using the arm.  Pain that wakes you up at night.  Sometimes, tenderness, swelling, warmth, or redness over the affected area.  Loss of strength.  Limited motion of the shoulder, especially reaching behind the back (to the back pocket or to unhook bra) or across your body.  Crackling sound (crepitation) when moving the arm.  Biceps tendon pain and inflammation (in the front of the shoulder). Worse when bending the elbow or lifting. CAUSES  Impingement syndrome is often an  overuse injury, in which chronic (repetitive) motions cause the tendons or bursa to become inflamed. A strain occurs when a force is paced on the tendon or muscle that is greater than it can withstand. Common mechanisms of injury include: Stress from sudden increase in duration, frequency, or intensity of training.  Direct hit (trauma) to the shoulder.  Aging, erosion of the tendon with normal use.  Bony bump on shoulder (acromial spur). RISK INCREASES WITH:  Contact sports (football, wrestling, boxing).  Throwing sports (baseball, tennis, volleyball).  Weightlifting and bodybuilding.  Heavy labor.  Previous injury to the rotator cuff, including impingement.  Poor shoulder strength and flexibility.  Failure to warm up properly before activity.  Inadequate protective equipment.  Old age.  Bony bump on shoulder (acromial spur). PREVENTION   Warm up and stretch properly before activity.  Allow for adequate recovery between workouts.  Maintain physical fitness:  Strength, flexibility, and endurance.  Cardiovascular fitness.  Learn and use proper exercise technique. PROGNOSIS  If treated properly, impingement syndrome usually goes away within 6 weeks. Sometimes surgery is required.  RELATED COMPLICATIONS   Longer healing time if not properly treated, or if not given enough time to heal.  Recurring symptoms, that result in a chronic condition.  Shoulder stiffness, frozen shoulder, or loss of motion.  Rotator cuff tendon tear.  Recurring symptoms, especially if activity is resumed too soon, with overuse, with a direct blow, or when using poor technique. TREATMENT  Treatment first involves the use of ice and medicine, to reduce pain and inflammation. The use of strengthening and stretching exercises may help reduce pain with activity. These exercises may  be performed at home or with a therapist. If non-surgical treatment is unsuccessful after more than 6 months,  surgery may be advised. After surgery and rehabilitation, activity is usually possible in 3 months.  MEDICATION  If pain medicine is needed, nonsteroidal anti-inflammatory medicines (aspirin and ibuprofen), or other minor pain relievers (acetaminophen), are often advised.  Do not take pain medicine for 7 days before surgery.  Prescription pain relievers may be given, if your caregiver thinks they are needed. Use only as directed and only as much as you need.  Corticosteroid injections may be given by your caregiver. These injections should be reserved for the most serious cases, because they may only be given a certain number of times. HEAT AND COLD  Cold treatment (icing) should be applied for 10 to 15 minutes every 2 to 3 hours for inflammation and pain, and immediately after activity that aggravates your symptoms. Use ice packs or an ice massage.  Heat treatment may be used before performing stretching and strengthening activities prescribed by your caregiver, physical therapist, or athletic trainer. Use a heat pack or a warm water soak. SEEK MEDICAL CARE IF:   Symptoms get worse or do not improve in 4 to 6 weeks, despite treatment.  New, unexplained symptoms develop. (Drugs used in treatment may produce side effects.) EXERCISES  RANGE OF MOTION (ROM) AND STRETCHING EXERCISES - Impingement Syndrome (Rotator Cuff  Tendinitis, Bursitis) These exercises may help you when beginning to rehabilitate your injury. Your symptoms may go away with or without further involvement from your physician, physical therapist or athletic trainer. While completing these exercises, remember:   Restoring tissue flexibility helps normal motion to return to the joints. This allows healthier, less painful movement and activity.  An effective stretch should be held for at least 30 seconds.  A stretch should never be painful. You should only feel a gentle lengthening or release in the stretched tissue. STRETCH  - Flexion, Standing  Stand with good posture. With an underhand grip on your right / left hand, and an overhand grip on the opposite hand, grasp a broomstick or cane so that your hands are a little more than shoulder width apart.  Keeping your right / left elbow straight and shoulder muscles relaxed, push the stick with your opposite hand, to raise your right / left arm in front of your body and then overhead. Raise your arm until you feel a stretch in your right / left shoulder, but before you have increased shoulder pain.  Try to avoid shrugging your right / left shoulder as your arm rises, by keeping your shoulder blade tucked down and toward your mid-back spine. Hold for __________ seconds.  Slowly return to the starting position. Repeat __________ times. Complete this exercise __________ times per day. STRETCH - Abduction, Supine  Lie on your back. With an underhand grip on your right / left hand and an overhand grip on the opposite hand, grasp a broomstick or cane so that your hands are a little more than shoulder width apart.  Keeping your right / left elbow straight and your shoulder muscles relaxed, push the stick with your opposite hand, to raise your right / left arm out to the side of your body and then overhead. Raise your arm until you feel a stretch in your right / left shoulder, but before you have increased shoulder pain.  Try to avoid shrugging your right / left shoulder as your arm rises, by keeping your shoulder blade tucked down and  toward your mid-back spine. Hold for __________ seconds.  Slowly return to the starting position. Repeat __________ times. Complete this exercise __________ times per day. ROM - Flexion, Active-Assisted  Lie on your back. You may bend your knees for comfort.  Grasp a broomstick or cane so your hands are about shoulder width apart. Your right / left hand should grip the end of the stick, so that your hand is positioned "thumbs-up," as if you  were about to shake hands.  Using your healthy arm to lead, raise your right / left arm overhead, until you feel a gentle stretch in your shoulder. Hold for __________ seconds.  Use the stick to assist in returning your right / left arm to its starting position. Repeat __________ times. Complete this exercise __________ times per day.  ROM - Internal Rotation, Supine   Lie on your back on a firm surface. Place your right / left elbow about 60 degrees away from your side. Elevate your elbow with a folded towel, so that the elbow and shoulder are the same height.  Using a broomstick or cane and your strong arm, pull your right / left hand toward your body until you feel a gentle stretch, but no increase in your shoulder pain. Keep your shoulder and elbow in place throughout the exercise.  Hold for __________ seconds. Slowly return to the starting position. Repeat __________ times. Complete this exercise __________ times per day. STRETCH - Internal Rotation  Place your right / left hand behind your back, palm up.  Throw a towel or belt over your opposite shoulder. Grasp the towel with your right / left hand.  While keeping an upright posture, gently pull up on the towel, until you feel a stretch in the front of your right / left shoulder.  Avoid shrugging your right / left shoulder as your arm rises, by keeping your shoulder blade tucked down and toward your mid-back spine.  Hold for __________ seconds. Release the stretch, by lowering your healthy hand. Repeat __________ times. Complete this exercise __________ times per day. ROM - Internal Rotation   Using an underhand grip, grasp a stick behind your back with both hands.  While standing upright with good posture, slide the stick up your back until you feel a mild stretch in the front of your shoulder.  Hold for __________ seconds. Slowly return to your starting position. Repeat __________ times. Complete this exercise __________  times per day.  STRETCH - Posterior Shoulder Capsule   Stand or sit with good posture. Grasp your right / left elbow and draw it across your chest, keeping it at the same height as your shoulder.  Pull your elbow, so your upper arm comes in closer to your chest. Pull until you feel a gentle stretch in the back of your shoulder.  Hold for __________ seconds. Repeat __________ times. Complete this exercise __________ times per day. STRENGTHENING EXERCISES - Impingement Syndrome (Rotator Cuff Tendinitis, Bursitis) These exercises may help you when beginning to rehabilitate your injury. They may resolve your symptoms with or without further involvement from your physician, physical therapist or athletic trainer. While completing these exercises, remember:  Muscles can gain both the endurance and the strength needed for everyday activities through controlled exercises.  Complete these exercises as instructed by your physician, physical therapist or athletic trainer. Increase the resistance and repetitions only as guided.  You may experience muscle soreness or fatigue, but the pain or discomfort you are trying to eliminate should never worsen  during these exercises. If this pain does get worse, stop and make sure you are following the directions exactly. If the pain is still present after adjustments, discontinue the exercise until you can discuss the trouble with your clinician.  During your recovery, avoid activity or exercises which involve actions that place your injured hand or elbow above your head or behind your back or head. These positions stress the tissues which you are trying to heal. STRENGTH - Scapular Depression and Adduction   With good posture, sit on a firm chair. Support your arms in front of you, with pillows, arm rests, or on a table top. Have your elbows in line with the sides of your body.  Gently draw your shoulder blades down and toward your mid-back spine. Gradually  increase the tension, without tensing the muscles along the top of your shoulders and the back of your neck.  Hold for __________ seconds. Slowly release the tension and relax your muscles completely before starting the next repetition.  After you have practiced this exercise, remove the arm support and complete the exercise in standing as well as sitting position. Repeat __________ times. Complete this exercise __________ times per day.  STRENGTH - Shoulder Abductors, Isometric  With good posture, stand or sit about 4-6 inches from a wall, with your right / left side facing the wall.  Bend your right / left elbow. Gently press your right / left elbow into the wall. Increase the pressure gradually, until you are pressing as hard as you can, without shrugging your shoulder or increasing any shoulder discomfort.  Hold for __________ seconds.  Release the tension slowly. Relax your shoulder muscles completely before you begin the next repetition. Repeat __________ times. Complete this exercise __________ times per day.  STRENGTH - External Rotators, Isometric  Keep your right / left elbow at your side and bend it 90 degrees.  Step into a door frame so that the outside of your right / left wrist can press against the door frame without your upper arm leaving your side.  Gently press your right / left wrist into the door frame, as if you were trying to swing the back of your hand away from your stomach. Gradually increase the tension, until you are pressing as hard as you can, without shrugging your shoulder or increasing any shoulder discomfort.  Hold for __________ seconds.  Release the tension slowly. Relax your shoulder muscles completely before you begin the next repetition. Repeat __________ times. Complete this exercise __________ times per day.  STRENGTH - Supraspinatus   Stand or sit with good posture. Grasp a __________ weight, or an exercise band or tubing, so that your hand is  "thumbs-up," like you are shaking hands.  Slowly lift your right / left arm in a "V" away from your thigh, diagonally into the space between your side and straight ahead. Lift your hand to shoulder height or as far as you can, without increasing any shoulder pain. At first, many people do not lift their hands above shoulder height.  Avoid shrugging your right / left shoulder as your arm rises, by keeping your shoulder blade tucked down and toward your mid-back spine.  Hold for __________ seconds. Control the descent of your hand, as you slowly return to your starting position. Repeat __________ times. Complete this exercise __________ times per day.  STRENGTH - External Rotators  Secure a rubber exercise band or tubing to a fixed object (table, pole) so that it is at the same  height as your right / left elbow when you are standing or sitting on a firm surface.  Stand or sit so that the secured exercise band is at your uninjured side.  Bend your right / left elbow 90 degrees. Place a folded towel or small pillow under your right / left arm, so that your elbow is a few inches away from your side.  Keeping the tension on the exercise band, pull it away from your body, as if pivoting on your elbow. Be sure to keep your body steady, so that the movement is coming only from your rotating shoulder.  Hold for __________ seconds. Release the tension in a controlled manner, as you return to the starting position. Repeat __________ times. Complete this exercise __________ times per day.  STRENGTH - Internal Rotators   Secure a rubber exercise band or tubing to a fixed object (table, pole) so that it is at the same height as your right / left elbow when you are standing or sitting on a firm surface.  Stand or sit so that the secured exercise band is at your right / left side.  Bend your elbow 90 degrees. Place a folded towel or small pillow under your right / left arm so that your elbow is a few inches  away from your side.  Keeping the tension on the exercise band, pull it across your body, toward your stomach. Be sure to keep your body steady, so that the movement is coming only from your rotating shoulder.  Hold for __________ seconds. Release the tension in a controlled manner, as you return to the starting position. Repeat __________ times. Complete this exercise __________ times per day.  STRENGTH - Scapular Protractors, Standing   Stand arms length away from a wall. Place your hands on the wall, keeping your elbows straight.  Begin by dropping your shoulder blades down and toward your mid-back spine.  To strengthen your protractors, keep your shoulder blades down, but slide them forward on your rib cage. It will feel as if you are lifting the back of your rib cage away from the wall. This is a subtle motion and can be challenging to complete. Ask your caregiver for further instruction, if you are not sure you are doing the exercise correctly.  Hold for __________ seconds. Slowly return to the starting position, resting the muscles completely before starting the next repetition. Repeat __________ times. Complete this exercise __________ times per day. STRENGTH - Scapular Protractors, Supine  Lie on your back on a firm surface. Extend your right / left arm straight into the air while holding a __________ weight in your hand.  Keeping your head and back in place, lift your shoulder off the floor.  Hold for __________ seconds. Slowly return to the starting position, and allow your muscles to relax completely before starting the next repetition. Repeat __________ times. Complete this exercise __________ times per day. STRENGTH - Scapular Protractors, Quadruped  Get onto your hands and knees, with your shoulders directly over your hands (or as close as you can be, comfortably).  Keeping your elbows locked, lift the back of your rib cage up into your shoulder blades, so your mid-back  rounds out. Keep your neck muscles relaxed.  Hold this position for __________ seconds. Slowly return to the starting position and allow your muscles to relax completely before starting the next repetition. Repeat __________ times. Complete this exercise __________ times per day.  STRENGTH - Scapular Retractors  Secure a rubber exercise  band or tubing to a fixed object (table, pole), so that it is at the height of your shoulders when you are either standing, or sitting on a firm armless chair.  With a palm down grip, grasp an end of the band in each hand. Straighten your elbows and lift your hands straight in front of you, at shoulder height. Step back, away from the secured end of the band, until it becomes tense.  Squeezing your shoulder blades together, draw your elbows back toward your sides, as you bend them. Keep your upper arms lifted away from your body throughout the exercise.  Hold for __________ seconds. Slowly ease the tension on the band, as you reverse the directions and return to the starting position. Repeat __________ times. Complete this exercise __________ times per day. STRENGTH - Shoulder Extensors   Secure a rubber exercise band or tubing to a fixed object (table, pole) so that it is at the height of your shoulders when you are either standing, or sitting on a firm armless chair.  With a thumbs-up grip, grasp an end of the band in each hand. Straighten your elbows and lift your hands straight in front of you, at shoulder height. Step back, away from the secured end of the band, until it becomes tense.  Squeezing your shoulder blades together, pull your hands down to the sides of your thighs. Do not allow your hands to go behind you.  Hold for __________ seconds. Slowly ease the tension on the band, as you reverse the directions and return to the starting position. Repeat __________ times. Complete this exercise __________ times per day.  STRENGTH - Scapular Retractors  and External Rotators   Secure a rubber exercise band or tubing to a fixed object (table, pole) so that it is at the height as your shoulders, when you are either standing, or sitting on a firm armless chair.  With a palm down grip, grasp an end of the band in each hand. Bend your elbows 90 degrees and lift your elbows to shoulder height, at your sides. Step back, away from the secured end of the band, until it becomes tense.  Squeezing your shoulder blades together, rotate your shoulders so that your upper arms and elbows remain stationary, but your fists travel upward to head height.  Hold for __________ seconds. Slowly ease the tension on the band, as you reverse the directions and return to the starting position. Repeat __________ times. Complete this exercise __________ times per day.  STRENGTH - Scapular Retractors and External Rotators, Rowing   Secure a rubber exercise band or tubing to a fixed object (table, pole) so that it is at the height of your shoulders, when you are either standing, or sitting on a firm armless chair.  With a palm down grip, grasp an end of the band in each hand. Straighten your elbows and lift your hands straight in front of you, at shoulder height. Step back, away from the secured end of the band, until it becomes tense.  Step 1: Squeeze your shoulder blades together. Bending your elbows, draw your hands to your chest, as if you are rowing a boat. At the end of this motion, your hands and elbow should be at shoulder height and your elbows should be out to your sides.  Step 2: Rotate your shoulders, to raise your hands above your head. Your forearms should be vertical and your upper arms should be horizontal.  Hold for __________ seconds. Slowly ease the tension  on the band, as you reverse the directions and return to the starting position. Repeat __________ times. Complete this exercise __________ times per day.  STRENGTH - Scapular Depressors  Find a sturdy  chair without wheels, such as a dining room chair.  Keeping your feet on the floor, and your hands on the chair arms, lift your bottom up from the seat, and lock your elbows.  Keeping your elbows straight, allow gravity to pull your body weight down. Your shoulders will rise toward your ears.  Raise your body against gravity by drawing your shoulder blades down your back, shortening the distance between your shoulders and ears. Although your feet should always maintain contact with the floor, your feet should progressively support less body weight, as you get stronger.  Hold for __________ seconds. In a controlled and slow manner, lower your body weight to begin the next repetition. Repeat __________ times. Complete this exercise __________ times per day.  Document Released: 02/15/2005 Document Revised: 05/10/2011 Document Reviewed: 05/30/2008 [facility] Patient Information 2015 [place], [place]. This information is not intended to replace advice given to you by your health care provider. Make sure you discuss any questions you have with your health care provider.

## 2014-10-01 NOTE — Assessment & Plan Note (Signed)
Suspect that patient may have OSA.   -Will order sleep study once patient has Ins.  She is unable to afford this at this time.

## 2014-10-01 NOTE — Assessment & Plan Note (Addendum)
No red flags. -Referral to sports medicine. -Voltaren prescribed

## 2014-10-01 NOTE — Progress Notes (Signed)
Subjective:     Patient ID: Carol Gross, female   DOB: Mar 26, 1980, 34 y.o.   MRN: [id number]  HPI  SHOULDER PAIN  Carol Gross complains of right shoulder pain that is constant and radiates down her arm. She say the pain is worse with movement. Her pain also restricts her ability to raise her arm above her head, her maximum abduction is 90 degrees. Sensation is different in her arm and she sometimes get tingling in her fingertips. She has to sleep with her arm at a 90 degree angle for her pain to be better. She denies any pain in her left shoulder or weakness in her hand only in her shoulder. She had an corticosteroid shot which helped for 3 days but the pain quickly returned. She is tired of the pain and is looking for relief.  Patient reports that they never received a call re: Sports Medicine appt.  She is still interested in going and would like to reschedule this.  BACK PAIN  Worsening since her lumbar disk surgery over the past few years. The pain radiates down her legs a little. She denies any loss of sensation in her legs, saddle anesthesia or fecal incontinence. She does have some urinary incontinence which she attributes to her pregnancies. She has been sleeping on her stomach. She denies any fevers or recent illnesses.  Denies urinary retention.  She would like some relief of her pain.  SLEEP DIFFICULTIES   She sleeps around 5 hours a night and wakes up frequently. She states that she snores a lot and wakes up with SOB. Her mother may have had sleep apnea. She also had daytime fatigue.  DEPRESSION   PHQ-9 = 20  The medicine has been helping her but recently there have been some familial issues that have been making her more depressed and anxious. She states that she did have some thoughts that if she was gone everything would be better, however, she denies wanting to commit suicide, does not have a plan, and does not want to harm others. Patient feels that her symptoms are  controlled on Cymbalta.  CONSTIPATION: Describes having symptoms of mild constipation and would like some help with this.    Review of Systems Pertinent ROS as per HPI    Objective:   Physical Exam  Constitutional: She is oriented to person, place, and time. No distress.  HENT:  Head: Normocephalic and atraumatic.  Mouth/Throat: Oropharynx is clear and moist. No oropharyngeal exudate.  Eyes: Conjunctivae are normal. Right eye exhibits no discharge. Left eye exhibits no discharge. No scleral icterus.  Cardiovascular: Normal rate, regular rhythm and normal heart sounds.  Exam reveals no gallop and no friction rub.   No murmur heard. Pulmonary/Chest: Effort normal and breath sounds normal. No respiratory distress. She has no wheezes. She has no rales. She exhibits no tenderness.  Abdominal: Soft. Bowel sounds are normal. She exhibits no distension. There is no tenderness. There is no rebound.  Musculoskeletal: She exhibits no edema.  Decreased abduction strength of the right shoulder. Positive empty can sign. Positive cross arm test. Normal strength throughout except for right shoulder. Limited active range of movement in the right shoulder.  patient unable to elevate R arm over head. Painful to palpation over the subacromial area.  Ambulates independently.  Gait is normal.  Station is normal.  Neurological: She is alert and oriented to person, place, and time. She has normal reflexes.  Decreased lower extremity reflexes compared to upper. Equal bilaterally  in upper and lower extremities. Grip strength normal.  Light touch sensation slightly distorted on RUE compared to LUE.  Light touch sensation in tact in LE.  Skin: She is not diaphoretic.   R Shoulder Xray reviewed.     Assessment and Plan   SHOULDER PAIN: DDx includes supraspinatus tendinitis, other rotator cuff tendon tear/tendonopathy, nerve impingement. Most likely supraspinatus tendinitis given tenderness to palpation over the  subacromial fossa, positive empty can sign negative x-ray, and decreased abduction strength. Given other findings of tingling fingers and shooting radiating pain further eval is required.  - Refer to sports medicine pending per patient's call as last appointment was missed - Oral voltaren 75mg  for pain relief - Should exercises handout given in AVS  BACK PAIN: Did not seem to have changed, no red flags or alarming symptoms. Pain may be aggravated by increased depression and lack of sleep.  - See above Shoulder pain for plan.  SLEEP APNEA: Likely due to daytime fatigue, obesity, snoring, restless sleep, night time awakening with SOB, and possible FHX.  - Once patient figures out her medicare we will order a sleep study  DEPRESSION: Low risk of suicide or harming others. Depressive symptoms are worse but patient states she has to figure out her familial issues. Cymbalta is helping her symptoms.  - continue current therapy and follow up with patient at next visit in 3 months.   CONSTIPATION: Benign.  Patient prescribed miralax, will follow up at next visit.   I have separately seen and examined the patient. I have discussed the findings and exam with student Dr Karoline Caldwell and agree with the above note.  My changes/additions are outlined in BLUE.   Carol M. Nadine Counts, DO PGY-2, [facility]  Total time spent with patient 30 minutes.  Greater than 50% of encounter spent in coordination of care/counseling.

## 2014-10-01 NOTE — Assessment & Plan Note (Signed)
PHQ9 score 20.   -Continue Cymbalta  qd. -Would consider referral to [facility]/ Dr Pascal Lux for therapy -Consider adding adjunctive SSRI/Wellbutrin at next visit -F.U in 3 months for depression.

## 2014-10-01 NOTE — Assessment & Plan Note (Addendum)
Suspicion for rotator cuff impingement.  DDx: Rotator cuff tear vs Deltoid pathology vs cervical etiology -Referral placed to sports medicine at last visit. Patient missed appt.  She will call to reschedule this -Would consider MRI of shoulder, though unsure how much this will change plan -Voltaren BID PRN shoulder pain.  Take with food. -Return PRN

## 2014-11-19 ENCOUNTER — Other Ambulatory Visit: Payer: Self-pay | Admitting: Family Medicine

## 2014-11-20 NOTE — Telephone Encounter (Signed)
Patient needs office visit.  Has not been seen since May, when medication was initiated.  Please advise patient a ONE time rx will be sent in but she needs office visit for further refills.

## 2014-11-20 NOTE — Telephone Encounter (Signed)
Left message for patient to call for a follow up appointment for medication refill.  Clovis Pu, RN

## 2014-12-16 ENCOUNTER — Ambulatory Visit: Payer: Medicaid Other | Admitting: Family Medicine

## 2015-01-01 ENCOUNTER — Ambulatory Visit: Payer: Medicaid Other | Admitting: Family Medicine

## 2015-01-08 ENCOUNTER — Ambulatory Visit (INDEPENDENT_AMBULATORY_CARE_PROVIDER_SITE_OTHER): Payer: Medicaid Other | Admitting: Family Medicine

## 2015-01-08 VITALS — BP 122/80 | HR 79 | Temp 98.1°F | Ht 66.0 in | Wt 227.5 lb

## 2015-01-08 DIAGNOSIS — G8929 Other chronic pain: Secondary | ICD-10-CM

## 2015-01-08 DIAGNOSIS — M545 Low back pain: Secondary | ICD-10-CM | POA: Diagnosis not present

## 2015-01-08 DIAGNOSIS — M25511 Pain in right shoulder: Secondary | ICD-10-CM | POA: Diagnosis not present

## 2015-01-08 MED ORDER — CYCLOBENZAPRINE HCL 5 MG PO TABS: 5.0000 mg | ORAL_TABLET | Freq: Every day | ORAL | Status: DC

## 2015-01-08 MED ORDER — ACETAMINOPHEN ER 650 MG PO TBCR
650.0000 mg | EXTENDED_RELEASE_TABLET | Freq: Three times a day (TID) | ORAL | Status: DC | PRN
Start: 2015-01-08 — End: 2015-06-13

## 2015-01-08 NOTE — Patient Instructions (Signed)
Thank you for coming to see me today. It was a pleasure. Today we talked about:   Shoulder pain: I am going to refer you to Sports Medicine for an ultrasound. Please continue to take the Voltaren and stop taking other NSAIDs such as Advil, Ibuprofen or Aleve. Please add Tylenol on top of what you are taking, which I have prescribed.   Back pain: Please restart your Lyrica. I am also prescribing Flexeril which is a muscle relaxant to help.  Please make an appointment to see Dr. Nadine CountsGottschalk for follow-up.  If you have any questions or concerns, please do not hesitate to call the office at [phone].  Sincerely,  [name], MD

## 2015-01-08 NOTE — Progress Notes (Signed)
    Subjective    Carol Gross is a 34 y.o. female that presents for a follow-up visit for chronic issues.   1. Right shoulder pain: This is a chronic issue. She has been seen about this a few months ago. Her symptoms have worsened since last visit. She has decreased strength and pain. She reports dropping items with her hand as well. She reports some shoulder popping as well. The Voltaren 75mg  has not been helping much. She was taking Advil as well with the Voltaren. She has not been taking Tylenol.   2. Back pain: This is a problem that has been present for about three months and is chronic. She has had this issue for the past three months. She has a history of disc protrusion for which she states she had surgery. She is able to walk. No falls.   Social History  Substance Use Topics  . Smoking status: Current Every Day Smoker -- 0.30 packs/day for 10 years    Types: Cigarettes  . Smokeless tobacco: Never Used     Comment: decreased smoking  . Alcohol Use: Yes     Comment: occasional/social    No Known Allergies  Meds ordered this encounter  Medications  . ibuprofen (ADVIL,MOTRIN) 200 MG tablet    Sig: Take 400 mg by mouth 2 (two) times daily as needed for moderate pain.    ROS  Per HPI   Objective   BP 122/80 mmHg  Pulse 79  Temp(Src) 98.1 F (36.7 C) (Oral)  Ht 5\' 6"  (1.676 m)  Wt 227 lb 8 oz (103.193 kg)  BMI 36.74 kg/m2  General: Well appearing, no distress Muscoloskeletal: Tender paraspinal muscles in thoracic area with some spasm palpated. No midline tenderness. Flexion, extension, rotation of lumbar back full  Assessment and Plan    Back pain: patient with some spasm on exam. Patient has also chronic low back pain that she has been managing with PCP. Per patient, and PCP notes, patient was supposed to be referred to Sports Medicine at last visit. - Flexeril - Continue Lyrica - Will place referral to sports medicine for follow-up per PCP recommendations at  last visit

## 2015-02-03 ENCOUNTER — Encounter: Payer: Self-pay | Admitting: Family Medicine

## 2015-02-03 ENCOUNTER — Ambulatory Visit (INDEPENDENT_AMBULATORY_CARE_PROVIDER_SITE_OTHER): Payer: Medicaid Other | Admitting: Family Medicine

## 2015-02-03 VITALS — BP 128/88 | Ht 67.0 in | Wt 238.0 lb

## 2015-02-03 DIAGNOSIS — M7501 Adhesive capsulitis of right shoulder: Secondary | ICD-10-CM | POA: Insufficient documentation

## 2015-02-03 DIAGNOSIS — M5416 Radiculopathy, lumbar region: Secondary | ICD-10-CM | POA: Diagnosis not present

## 2015-02-04 NOTE — Progress Notes (Signed)
   Subjective:    Patient ID: Carol Gross, female    DOB: 1981-02-13, 34 y.o.   MRN: [id number]  HPI #1. Right shoulder pain. Chronic. Somewhat worse over the last 2-3 months. She's been taking over-the-counter medications without much improvement. Complains of dropping items. Right-hand dominant. Pain is in the posterior shoulder. Does not radiate to the hand. Shoulder pain is worse with overhead movement. #2. Chronic back pain. Several years ago she had microdiscectomy. She had some improvement of her symptoms at that time. In the last 3 weeks she's had return of similar symptoms with radiation of pain down the posterior portion of her left leg to the sole of her foot. She's had no new injury. She's worried she has ruptured another disc. She's had no bowel or bladder incontinence.   Review of Systems No fever, sweats, chills. See history of present illness above. Additional pertinent review systems is negative for unusual weight change.    Objective:   Physical Exam Vital signs are reviewed GEN.: Well-developed overweight female no acute distress  SHOULDER: Right. Full range of motion in all planes the rotator cuff; notably she has some mild stiffness in the last 30 of abduction and forward flexion that does not seem to be necessarily related to pain.. She has some mild pain with crossover testing but has full strength. Scapular motion is symmetrical. She has mild tenderness to palpation along the rhomboid muscle on the right and trapezius muscle on the right. BACK: Mild tenderness to palpation bilateral paravertebral muscles and lumbar area worse on the left. Tender to palpation over the PSIS. Left. There is no deformity noted. Nontender to percussion the vertebra. Hyperextension increases her pain as does forward flexion. She can flex at the hips and bring her hands still only about 18 inches from the floor. NEURO: Straight leg raise positive on the left with pain at 90 of hip flexion in  both seated and supine positions. Negative crossed leg straight leg raise. DTRs at the knee and ankle are 2+ bilaterally equal. MSK: Lower extremity strength 5 out of 5 in hip and knee flexors and extensors. She has intact strength in dorsiflexion plantar flexion that is bilaterally symmetrical.  IMAGING: Shoulder x-rays from 09/17/2014 are essentially negative for arthropathy, dislocation, fracture.     Assessment & Plan:

## 2015-02-04 NOTE — Assessment & Plan Note (Signed)
She is pretty adamant that her symptoms now over the last 3 weeks or significantly worse than her prior chronic symptoms. Given her history of large central herniated disc with subsequent like her discectomy, think the only way to fully evaluate this is with MRI so we have set that up. I'll see her back after MRI.

## 2015-02-04 NOTE — Assessment & Plan Note (Signed)
I think she has some generalized rotator cuff pain syndrome symptoms. My concern is that she starting to get it he said capsulitis because she's been favoring that shoulder and not moving it like she should. We discussed this at length. I gave her handout on home exercise program emphasizing the rotator cuff muscle strengthening exercises as well as forward flexion with elastic band. Like to see her back in 6-8 weeks. I do not think she'll get better less shesome attention to this home exercise program and I have informed her of that.

## 2015-02-05 ENCOUNTER — Ambulatory Visit
Admission: RE | Admit: 2015-02-05 | Discharge: 2015-02-05 | Disposition: A | Discharge status: Home / Self Care | Payer: Medicaid Other | Source: Ambulatory Visit | Attending: Family Medicine | Admitting: Family Medicine

## 2015-02-05 DIAGNOSIS — M5416 Radiculopathy, lumbar region: Secondary | ICD-10-CM

## 2015-02-11 ENCOUNTER — Telehealth: Payer: Self-pay | Admitting: Family Medicine

## 2015-02-11 ENCOUNTER — Encounter: Payer: Self-pay | Admitting: Family Medicine

## 2015-02-11 NOTE — Telephone Encounter (Signed)
Rhea or [place] Please let her know I am sending a report in the mail. Her MRI shows some small amount of narrowing at the previous disc site. I do not know if this is causing her pain or not. The good news is that it is NOT a huge protrusion like last time. I will be happy to send her back to her neurosurgeon for their evaluation.  That is about all I can do for her as we do not manage chronic pain at our clinic If she wants to come in and see the images and discuss I can do that as well (but make sure she knows I am not rx pain meds) THANKS! Denny LevySara Dahlton Hinde

## 2015-02-12 ENCOUNTER — Ambulatory Visit: Payer: Medicaid Other | Admitting: Family Medicine

## 2015-02-13 NOTE — Addendum Note (Signed)
Addended by: Annita BrodMOORE, Xin Klawitter C on: 02/13/2015 05:40 PM   Modules accepted: Orders

## 2015-02-17 ENCOUNTER — Encounter: Payer: Self-pay | Admitting: Family Medicine

## 2015-02-17 ENCOUNTER — Ambulatory Visit (INDEPENDENT_AMBULATORY_CARE_PROVIDER_SITE_OTHER): Payer: Medicaid Other | Admitting: Family Medicine

## 2015-02-17 VITALS — BP 132/94 | HR 68 | Temp 98.1°F | Ht 66.0 in | Wt 230.2 lb

## 2015-02-17 DIAGNOSIS — F321 Major depressive disorder, single episode, moderate: Secondary | ICD-10-CM | POA: Diagnosis not present

## 2015-02-17 DIAGNOSIS — Z23 Encounter for immunization: Secondary | ICD-10-CM | POA: Diagnosis not present

## 2015-02-17 DIAGNOSIS — G44209 Tension-type headache, unspecified, not intractable: Secondary | ICD-10-CM | POA: Diagnosis not present

## 2015-02-17 DIAGNOSIS — M5416 Radiculopathy, lumbar region: Secondary | ICD-10-CM | POA: Diagnosis not present

## 2015-02-17 MED ORDER — PREGABALIN 100 MG PO CAPS: 100.0000 mg | ORAL_CAPSULE | Freq: Two times a day (BID) | ORAL | Status: DC

## 2015-02-17 MED ORDER — DULOXETINE HCL 30 MG PO CPEP
30.0000 mg | ORAL_CAPSULE | Freq: Every day | ORAL | Status: DC
Start: 2015-02-17 — End: 2015-06-11

## 2015-02-17 NOTE — Assessment & Plan Note (Signed)
PHQ-9 score 21.  Patient self discontinued Cymbalta. No SI/HI. - Will initiate Cymbalta again.  Start 30mg  daily.   - Patient to return in 1 month for reassessment.  Plan to increase to 60mg  at that time - Patient also scheduled with in house counselor 12/20.   - Patient to call if needs other time slot.

## 2015-02-17 NOTE — Patient Instructions (Signed)
If your symptoms worsen or you have thoughts of suicide/homicide, PLEASE SEEK IMMEDIATE MEDICAL ATTENTION.  You may always call the National Suicide Hotline.  This is available 24 hours a day, 7 days a week.  Their number is: [phone])[phone]  Taking the medicine as directed and not missing any doses is one of the best things you can do to treat your depression.  Here are some things to keep in mind:  1) Side effects (stomach upset, some increased anxiety) may happen before you notice a benefit.  These side effects typically go away over time. 2) Changes to your dose of medicine or a change in medication all together is sometimes necessary 3) Most people need to be on medication at least 6-12 months 4) Many people will notice an improvement within two weeks but the full effect of the medication can take up to 4-6 weeks 5) Stopping the medication when you start feeling better often results in a return of symptoms If you start having thoughts of hurting yourself or others after starting this medicine, please call me at [phone] immediately.

## 2015-02-17 NOTE — Progress Notes (Signed)
    Subjective: CC: lumbar radiculopathy, chronic HPI: Carol Gross is a 34 y.o. female presenting to clinic today for office visit. Concerns today include:  1. Lumbar radiculopathy, chronic Patient recently had MRI performed.  She notes that she has been waiting on a call from Sports Medicine with recommendations as to whether she should change neurosurgeons or not.  She is aware of results.  She reports that she wants to pursue disability.  She reports that she had seen a lawyer several years ago about this but did not finish her pursuit of disability.  Denies falls.  Endorses continued low back pain.  Denies saddle anesthesia, urinary retention, fecal incontinence.  2. Headache Has been using Motrin 800mg , Excedrin as needed headache.  Comes and goes 2-3 times daily.  Headache is pounding in the back of her head.  Denies photophobia, nausea or vomiting.  Endorses some dizziness during headache.  Endorses increased stress.  3. Depression Was on Cymbalta.  Discontinued it several months ago because she ran out of refills.  She reports worsening depression.  She reports that her pain makes her fed up with things.  She endorses fatigue, decreased concentration, poor appetite, sadness and loss of interest in things that normally bring her happiness. She voices worry about her financial situation because she cannot work 2/2 back pain.  She denies SI/HI.  Social History Reviewed. FamHx and MedHx reviewed.  Please see EMR. Health Maintenance: flu shot today  ROS: Per HPI  Objective: Office vital signs reviewed. BP 132/94 mmHg  Pulse 68  Temp(Src) 98.1 F (36.7 C) (Oral)  Ht 5\' 6"  (1.676 m)  Wt 230 lb 3.2 oz (104.418 kg)  BMI 37.17 kg/m2  LMP 02/13/2015  Physical Examination:  General: Awake, alert, obese, No acute distress HEENT: Normal, MMM Cardio: regular rate and rhythm, S1S2 heard, no murmurs appreciated Pulm: clear to auscultation bilaterally, no wheezes, rhonchi or rales,  normal WOB Extremities: warm, well perfused, No edema, cyanosis or clubbing; +2 raidal pulses bilaterally MSK: slow gait and station, lumbar flattening, +TTP to Left lumbar paraspinal muscles, no midline TTP, no palpable bony deformities Skin: dry, intact, no rashes or lesions Neuro: Strength grossly intact, patellar DTRs 2/4, light touch sensation decreased on LLE compared to RLE. Psych: depressed mood, tearful, speech normal, PHQ-9 score 21  Assessment/ Plan: 34 y.o. female   Lumbar radiculopathy, chronic Patient to continue to follow up with sports medicine/ neurosurgery as directed - MRI reviewed with patient - Patient interested in disability, recommended that she seek a disability provider for formal examination. - Provided a letter outlining problem list and medication list - return precautions reviewed.  Depression, major PHQ-9 score 21.  Patient self discontinued Cymbalta. No SI/HI. - Will initiate Cymbalta again.  Start 30mg  daily.   - Patient to return in 1 month for reassessment.  Plan to increase to 60mg  at that time - Patient also scheduled with in house counselor 12/20.   - Patient to call if needs other time slot.  Tension-type headache, not intractable, unspecified chronicity pattern.  Likely 2/2 to stressors.  No focal neuro deficits on exam. - Continue Tylenol/ Motrin as needed for headache - Return precautions reviewed.  Encounter for immunization - Flu shot administered   Raliegh IpAshly M Dmari Schubring, DO PGY-2, [facility]

## 2015-02-17 NOTE — Assessment & Plan Note (Signed)
Patient to continue to follow up with sports medicine/ neurosurgery as directed - MRI reviewed with patient - Patient interested in disability, recommended that she seek a disability provider for formal examination. - Provided a letter outlining problem list and medication list - return precautions reviewed.

## 2015-02-18 ENCOUNTER — Ambulatory Visit: Payer: Medicaid Other

## 2015-02-20 ENCOUNTER — Ambulatory Visit: Payer: Medicaid Other

## 2015-05-26 ENCOUNTER — Other Ambulatory Visit: Payer: Self-pay | Admitting: Neurological Surgery

## 2015-05-28 ENCOUNTER — Telehealth: Payer: Self-pay | Admitting: Vascular Surgery

## 2015-05-28 NOTE — Telephone Encounter (Addendum)
Spoke to pt to sch appt. 06/17/15 @ 1:30.  ----- Message from Phillips Odorarol S Pullins, RN sent at 05/27/2015 11:04 AM EDT ----- Regarding: needs consult appt. with [facility] Please schedule for a new patient consult appt. With Dr. Arbie CookeyEarly, prior to ALIF scheduled 06/25/15. Dr. Arbie CookeyEarly will be assisting Dr. Yetta BarreJones with an ALIF.  Please remind the pt. to bring copy of LS spine films to office with her.

## 2015-06-04 ENCOUNTER — Other Ambulatory Visit: Payer: Self-pay

## 2015-06-06 ENCOUNTER — Encounter: Payer: Self-pay | Admitting: Vascular Surgery

## 2015-06-11 ENCOUNTER — Ambulatory Visit (INDEPENDENT_AMBULATORY_CARE_PROVIDER_SITE_OTHER): Payer: Medicaid Other | Admitting: Family Medicine

## 2015-06-11 ENCOUNTER — Encounter: Payer: Self-pay | Admitting: Family Medicine

## 2015-06-11 VITALS — BP 128/69 | HR 73 | Temp 98.2°F | Ht 66.0 in | Wt 226.6 lb

## 2015-06-11 DIAGNOSIS — M5416 Radiculopathy, lumbar region: Secondary | ICD-10-CM | POA: Diagnosis not present

## 2015-06-11 DIAGNOSIS — Z01818 Encounter for other preprocedural examination: Secondary | ICD-10-CM

## 2015-06-11 DIAGNOSIS — N921 Excessive and frequent menstruation with irregular cycle: Secondary | ICD-10-CM

## 2015-06-11 DIAGNOSIS — Z720 Tobacco use: Secondary | ICD-10-CM | POA: Diagnosis not present

## 2015-06-11 DIAGNOSIS — F321 Major depressive disorder, single episode, moderate: Secondary | ICD-10-CM | POA: Diagnosis not present

## 2015-06-11 LAB — CBC
HCT: 36.3 % (ref 35.0–45.0)
HEMOGLOBIN: 11.3 g/dL — AB (ref 11.7–15.5)
MCH: 24.7 pg — ABNORMAL LOW (ref 27.0–33.0)
MCHC: 31.1 g/dL — ABNORMAL LOW (ref 32.0–36.0)
MCV: 79.4 fL — ABNORMAL LOW (ref 80.0–100.0)
MPV: 9.4 fL (ref 7.5–12.5)
Platelets: 368 10*3/uL (ref 140–400)
RBC: 4.57 MIL/uL (ref 3.80–5.10)
RDW: 16.5 % — ABNORMAL HIGH (ref 11.0–15.0)
WBC: 8.1 10*3/uL (ref 3.8–10.8)

## 2015-06-11 LAB — COMPLETE METABOLIC PANEL WITH GFR
ALBUMIN: 4.2 g/dL (ref 3.6–5.1)
ALK PHOS: 63 U/L (ref 33–115)
ALT: 10 U/L (ref 6–29)
AST: 12 U/L (ref 10–30)
BILIRUBIN TOTAL: 0.4 mg/dL (ref 0.2–1.2)
BUN: 8 mg/dL (ref 7–25)
CO2: 26 mmol/L (ref 20–31)
Calcium: 9.5 mg/dL (ref 8.6–10.2)
Chloride: 107 mmol/L (ref 98–110)
Creat: 0.84 mg/dL (ref 0.50–1.10)
GFR, Est African American: >89 mL/min (ref >=60)
GFR, Est Non African American: >89 mL/min (ref >=60)
GLUCOSE: 84 mg/dL (ref 65–99)
Potassium: 4.6 mmol/L (ref 3.5–5.3)
SODIUM: 138 mmol/L (ref 135–146)
TOTAL PROTEIN: 7 g/dL (ref 6.1–8.1)

## 2015-06-11 MED ORDER — DULOXETINE HCL 30 MG PO CPEP: 30.0000 mg | ORAL_CAPSULE | Freq: Every day | ORAL | Status: DC

## 2015-06-11 NOTE — Patient Instructions (Signed)
I have placed ordered for ultrasound of your uterus.  We may need to refer you to gyn.  Schedule this after your surgery.  Seem me in 1 month for depression

## 2015-06-11 NOTE — Progress Notes (Signed)
Carol Gross is a 35 y.o. female who is here for:  1.  Preoperative clearance for back surgery scheduled for 06/25/15 with Dr Marikay Alaravid Jones.  1) High Risk Cardiac Conditions  1) Recent MI - No.  2) Decompensated Heart Failure - No.  3) unstable angina - No.  4) Symptomatic arrythmia - No.  5) Sx Valvular Disease - No.  2) Intermediate Risk Factors - DM, CKD, CVA, CHF, CAD - No.  2) Functional Status - > 4 mets ( Walk, run, climb stairs ) Yes.    3) Surgery Specific Risk - High  (Emergency, Vascular, Extensive ops)          Intermediate (Carotid, Head and Neck, Orthopaedic )          Low (Endoscopic, Cataract, Breast )  4) Further Noninvasive evaluation -   1) EKG - No.   1) Hx of CVA, CAD, DM, CKD  2) Echo - No.   1) Worsening dyspnea   3) Stress Testing - Active Cardiac Disease - No.  5) Need for medical therapy - Beta Blocker, Statins indicated ? No.   2. Menorrhagia: Bleeding every 2 weeks for 4-5 days.  +Clots, going through super pads q2 hours.  No dizziness, no palpitations.  3. Tobacco use d/o Continues to smoke 3-4 cigs/ day.  No SOB.  PE: Filed Vitals:   06/11/15 1518  BP: 128/69  Pulse: 73  Temp: 98.2 F (36.8 C)   Physical Examination: General appearance - alert, well appearing, and in no distress and overweight Mental status - alert, oriented to person, place, and time, normal mood, behavior, speech, dress, motor activity, and thought processes Eyes - pupils equal and reactive, extraocular eye movements intact, sclera anicteric Mouth - mucous membranes moist, pharynx normal without lesions Lymphatics - no palpable lymphadenopathy Heart - normal rate, regular rhythm, normal S1, S2, no murmurs, rubs, clicks or gallops Pulm- normal WOB on room air. No wheeze Extremities - peripheral pulses normal, no pedal edema, no clubbing or cyanosis  Carol Gross is a 35 y.o. female that presents for:  1. Pre-op exam.  Obese and smokes but otherwise normal  exam. - CBC - COMPLETE METABOLIC PANEL WITH GFR  2. Lumbar radiculopathy, chronic - scheduled for surgery 4/26  3. Tobacco abuse. Smokes 3-4 cigs/day.  Do not think she would benefit from Nicotine patches given total dose of daily nicotine. - nicotine gum/ behavioural modification encouraged  4. Major depressive disorder, single episode, moderate (HCC) - DULoxetine (CYMBALTA) 30 MG capsule; Take 1 capsule (30 mg total) by mouth daily.  Dispense: 30 capsule; Refill: 0  5. Menorrhagia with irregular cycle - Patient does not wish to do this before surgery.  She will call for appt. - US Transvaginal Non-OB; Future - US Pelvis Complete; Future - CBC  Follow up in 1 month for Cymbalta f/u.  If patient doing well on 30mg  can call and will refill that strength with f/u in 3 months.  Dade Rodin M. Nadine CountsGottschalk, DO PGY-2, [facility]

## 2015-06-16 ENCOUNTER — Encounter: Payer: Self-pay | Admitting: Family Medicine

## 2015-06-17 ENCOUNTER — Encounter: Payer: Medicaid Other | Admitting: Vascular Surgery

## 2015-06-17 ENCOUNTER — Encounter (HOSPITAL_COMMUNITY): Payer: Self-pay

## 2015-06-17 ENCOUNTER — Other Ambulatory Visit (HOSPITAL_COMMUNITY): Payer: Self-pay | Admitting: *Deleted

## 2015-06-17 ENCOUNTER — Encounter (HOSPITAL_COMMUNITY)
Admission: RE | Admit: 2015-06-17 | Discharge: 2015-06-17 | Disposition: A | Discharge status: Home / Self Care | Payer: Medicaid Other | Source: Ambulatory Visit | Attending: Neurological Surgery | Admitting: Neurological Surgery

## 2015-06-17 ENCOUNTER — Ambulatory Visit (HOSPITAL_COMMUNITY)
Admission: RE | Admit: 2015-06-17 | Discharge: 2015-06-17 | Disposition: A | Discharge status: Home / Self Care | Payer: Medicaid Other | Source: Ambulatory Visit | Attending: Neurological Surgery | Admitting: Neurological Surgery

## 2015-06-17 DIAGNOSIS — Z720 Tobacco use: Secondary | ICD-10-CM | POA: Diagnosis not present

## 2015-06-17 DIAGNOSIS — I498 Other specified cardiac arrhythmias: Secondary | ICD-10-CM | POA: Diagnosis not present

## 2015-06-17 DIAGNOSIS — Z01812 Encounter for preprocedural laboratory examination: Secondary | ICD-10-CM | POA: Diagnosis not present

## 2015-06-17 DIAGNOSIS — N921 Excessive and frequent menstruation with irregular cycle: Secondary | ICD-10-CM | POA: Diagnosis not present

## 2015-06-17 DIAGNOSIS — Z01810 Encounter for preprocedural cardiovascular examination: Secondary | ICD-10-CM | POA: Insufficient documentation

## 2015-06-17 DIAGNOSIS — M5136 Other intervertebral disc degeneration, lumbar region: Secondary | ICD-10-CM

## 2015-06-17 DIAGNOSIS — Z79899 Other long term (current) drug therapy: Secondary | ICD-10-CM | POA: Insufficient documentation

## 2015-06-17 DIAGNOSIS — Z01818 Encounter for other preprocedural examination: Secondary | ICD-10-CM | POA: Diagnosis not present

## 2015-06-17 DIAGNOSIS — I10 Essential (primary) hypertension: Secondary | ICD-10-CM | POA: Diagnosis not present

## 2015-06-17 HISTORY — DX: Fibromyalgia: M79.7

## 2015-06-17 HISTORY — DX: Unspecified osteoarthritis, unspecified site: M19.90

## 2015-06-17 LAB — HCG, SERUM, QUALITATIVE: Preg, Serum: NEGATIVE

## 2015-06-17 LAB — BASIC METABOLIC PANEL
ANION GAP: 9 (ref 5–15)
BUN: 11 mg/dL (ref 6–20)
CALCIUM: 9.2 mg/dL (ref 8.9–10.3)
CO2: 22 mmol/L (ref 22–32)
Chloride: 107 mmol/L (ref 101–111)
Creatinine, Ser: 1.21 mg/dL — ABNORMAL HIGH (ref 0.44–1.00)
GFR calc Af Amer: >60 mL/min (ref >60)
GFR calc non Af Amer: 59 mL/min — ABNORMAL LOW (ref >60)
GLUCOSE: 91 mg/dL (ref 65–99)
Potassium: 4 mmol/L (ref 3.5–5.1)
Sodium: 138 mmol/L (ref 135–145)

## 2015-06-17 LAB — CBC WITH DIFFERENTIAL/PLATELET
BASOS PCT: 0 %
Basophils Absolute: 0 10*3/uL (ref 0.0–0.1)
EOS PCT: 1 %
Eosinophils Absolute: 0.1 10*3/uL (ref 0.0–0.7)
HEMATOCRIT: 36.3 % (ref 36.0–46.0)
Hemoglobin: 11.2 g/dL — ABNORMAL LOW (ref 12.0–15.0)
LYMPHS PCT: 30 %
Lymphs Abs: 2.9 10*3/uL (ref 0.7–4.0)
MCH: 24.7 pg — ABNORMAL LOW (ref 26.0–34.0)
MCHC: 30.9 g/dL (ref 30.0–36.0)
MCV: 80.1 fL (ref 78.0–100.0)
MONO ABS: 0.5 10*3/uL (ref 0.1–1.0)
MONOS PCT: 6 %
NEUTROS ABS: 6.1 10*3/uL (ref 1.7–7.7)
Neutrophils Relative %: 63 %
PLATELETS: 320 10*3/uL (ref 150–400)
RBC: 4.53 MIL/uL (ref 3.87–5.11)
RDW: 15.8 % — AB (ref 11.5–15.5)
WBC: 9.5 10*3/uL (ref 4.0–10.5)

## 2015-06-17 LAB — PROTIME-INR
INR: 1.08 (ref 0.00–1.49)
Prothrombin Time: 14.2 seconds (ref 11.6–15.2)

## 2015-06-17 LAB — SURGICAL PCR SCREEN
MRSA, PCR: NEGATIVE
Staphylococcus aureus: POSITIVE — AB

## 2015-06-17 LAB — NO BLOOD PRODUCTS

## 2015-06-17 NOTE — Progress Notes (Signed)
Mupirocin Ointment Rx called into [facility] on [address] for positive PCR of Staph. Pt notified and voiced understanding.

## 2015-06-17 NOTE — Pre-Procedure Instructions (Addendum)
Sharmel L Detty  06/17/2015      [facility] [id number] [place] - [address] [facility] [address] [facility] [address] [place] [address] Phone: [phone] Fax: [phone]  [facility] [address] [place], [place] - [id number] [place] [address] Phone: [phone] Fax: [phone]    Your procedure is scheduled on 06/25/15.  [facility].M[facility]  Call this number if you have problems the morning of surgery:  [phone]   Remember:  Do not eat food or drink liquids after midnight.  Take these medicines the morning of surgery with A SIP OF WATER cymbalta,hydrocodone if needed  STOP all herbel meds, nsaids (aleve,naproxen,advil,ibuprofen) 5 days prior to surgery (06/20/15) including vitamins,aspirin   Do not wear jewelry, make-up or nail polish.  Do not wear lotions, powders, or perfumes.  You may wear deodorant.  Do not shave 48 hours prior to surgery.  Men may shave face and neck.  Do not bring valuables to the hospital.  [facility] is not responsible for any belongings or valuables.  Contacts, dentures or bridgework may not be worn into surgery.  Leave your suitcase in the car.  After surgery it may be brought to your room.  For patients admitted to the hospital, discharge time will be determined by your treatment team.  Patients discharged the day of surgery will not be allowed to drive home.   Name and phone number of your driver:    Special instructions:   Special Instructions: [place] - Preparing for Surgery  Before surgery, you can play an important role.  Because skin is not sterile, your skin needs to be as free of germs as possible.  You can reduce the number of germs on you skin by washing with CHG (chlorahexidine gluconate) soap before surgery.  CHG is an antiseptic cleaner which kills germs and bonds with the skin to continue killing germs even  after washing.  Please DO NOT use if you have an allergy to CHG or antibacterial soaps.  If your skin becomes reddened/irritated stop using the CHG and inform your nurse when you arrive at Short Stay.  Do not shave (including legs and underarms) for at least 48 hours prior to the first CHG shower.  You may shave your face.  Please follow these instructions carefully:   1.  Shower with CHG Soap the night before surgery and the morning of Surgery.  2.  If you choose to wash your hair, wash your hair first as usual with your normal shampoo.  3.  After you shampoo, rinse your hair and body thoroughly to remove the Shampoo.  4.  Use CHG as you would any other liquid soap.  You can apply chg directly  to the skin and wash gently with scrungie or a clean washcloth.  5.  Apply the CHG Soap to your body ONLY FROM THE NECK DOWN.  Do not use on open wounds or open sores.  Avoid contact with your eyes ears, mouth and genitals (private parts).  Wash genitals (private parts)       with your normal soap.  6.  Wash thoroughly, paying special attention to the area where your surgery will be performed.  7.  Thoroughly rinse your body with warm water from the neck down.  8.  DO NOT shower/wash with your normal soap after using and rinsing off  the CHG Soap.  9.  Pat yourself dry with a clean towel.            10.  Wear clean pajamas.            11.  Place clean sheets on your bed the night of your first shower and do not sleep with pets.  Day of Surgery  Do not apply any lotions/deodorants the morning of surgery.  Please wear clean clothes to the hospital/surgery center.  Please read over the following fact sheets that you were given. Pain Booklet, Coughing and Deep Breathing, Blood Transfusion Information, MRSA Information and Surgical Site Infection Prevention

## 2015-06-18 ENCOUNTER — Encounter: Payer: Self-pay | Admitting: Vascular Surgery

## 2015-06-23 ENCOUNTER — Ambulatory Visit (HOSPITAL_COMMUNITY)
Admission: RE | Admit: 2015-06-23 | Discharge: 2015-06-23 | Disposition: A | Discharge status: Home / Self Care | Payer: Medicaid Other | Source: Ambulatory Visit | Attending: Family Medicine | Admitting: Family Medicine

## 2015-06-23 DIAGNOSIS — N920 Excessive and frequent menstruation with regular cycle: Secondary | ICD-10-CM | POA: Diagnosis present

## 2015-06-23 DIAGNOSIS — N938 Other specified abnormal uterine and vaginal bleeding: Secondary | ICD-10-CM | POA: Diagnosis not present

## 2015-06-23 DIAGNOSIS — N854 Malposition of uterus: Secondary | ICD-10-CM | POA: Diagnosis not present

## 2015-06-23 DIAGNOSIS — N921 Excessive and frequent menstruation with irregular cycle: Secondary | ICD-10-CM

## 2015-06-24 ENCOUNTER — Ambulatory Visit (INDEPENDENT_AMBULATORY_CARE_PROVIDER_SITE_OTHER): Payer: Medicaid Other | Admitting: Vascular Surgery

## 2015-06-24 ENCOUNTER — Encounter: Payer: Self-pay | Admitting: Vascular Surgery

## 2015-06-24 VITALS — BP 115/75 | HR 73 | Temp 97.8°F | Resp 16 | Ht 67.0 in | Wt 230.0 lb

## 2015-06-24 DIAGNOSIS — M5137 Other intervertebral disc degeneration, lumbosacral region: Secondary | ICD-10-CM

## 2015-06-24 MED ORDER — DEXAMETHASONE SODIUM PHOSPHATE 10 MG/ML IJ SOLN
10.0000 mg | INTRAMUSCULAR | Status: AC
  Administered 2015-06-25: 10 mg via INTRAVENOUS
  Filled 2015-06-24: qty 1

## 2015-06-24 MED ORDER — CEFAZOLIN SODIUM-DEXTROSE 2-4 GM/100ML-% IV SOLN
2.0000 g | INTRAVENOUS | Status: AC
  Administered 2015-06-25: 2 g via INTRAVENOUS
  Filled 2015-06-24: qty 100

## 2015-06-24 NOTE — Progress Notes (Signed)
[facility]ist [facility]  Patient name: Carol Gross MRN: [id number] DOB: 03/01/1981 Sex: female  REASON FOR CONSULT: Discuss anterior exposure for L5 5 S1 disc surgery  HPI: Carol Gross is a 35 y.o. female, who is seen today for discussion of anterior exposure for L5-S1 disc surgery. She has been evaluated by Dr. Marikay Alar who has recommended anterior approach. She had prior echo discectomy 2014 by another surgeon. Has had recurrent back pain and is now being evaluated for anterior exposure. Her surgery is scheduled for tomorrow. She does have a history of tubal ligation. No other intra-abdominal surgery. 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 I did discuss Cell Saver and she is okay with use of Cell Saver at the time of surgery. She is here today with her mother  Past Medical History  Diagnosis Date  . Depression   . Carpal tunnel syndrome   . Back pain     sees ortho  . Chronic back pain   . Shortness of breath     exertion  . Headache(784.0)   . Hypertension     previously on. d/c'd by dr over month ago  . Arthritis   . Fibromyalgia     ?    History reviewed. No pertinent family history.  SOCIAL HISTORY: Social History   Social History  . Marital Status: Single    Spouse Name: N/A  . Number of Children: N/A  . Years of Education: N/A   Occupational History  . Not on file.   Social History Main Topics  . Smoking status: Current Every Day Smoker -- 0.30 packs/day for 10 years    Types: Cigarettes  . Smokeless tobacco: Never Used     Comment: decreased smoking  . Alcohol Use: 0.0 oz/week    0 Standard drinks or equivalent per week     Comment: occasional/social  . Drug Use: Yes    Special: Marijuana     Comment: marijuana today 06/17/15  . Sexual Activity: Yes    Birth Control/ Protection: Surgical   Other Topics Concern  . Not on file   Social History Narrative    Allergies  Allergen Reactions  . Other     No blood jehovah's witness    Current Outpatient Prescriptions  Medication Sig Dispense Refill  . DULoxetine (CYMBALTA) 30 MG capsule Take 1 capsule (30 mg total) by mouth daily. 30 capsule 0  . fluticasone (FLONASE) 50 MCG/ACT nasal spray Place 1 spray into both nostrils daily.    Marland Kitchen HYDROcodone-acetaminophen (NORCO/VICODIN) 5-325 MG tablet Take 1 tablet by mouth every 6 (six) hours as needed for moderate pain.   0  . ibuprofen (ADVIL,MOTRIN) 200 MG tablet Take 400 mg by mouth 2 (two) times daily as needed for moderate pain. Reported on 06/24/2015     No current facility-administered medications for this visit.    REVIEW OF SYSTEMS:   denotes positive finding,  denotes negative finding Cardiac  Comments:  Chest pain or chest pressure: x   Shortness of breath upon exertion: x   Short of breath when lying flat:    Irregular heart rhythm:        Vascular    Pain in calf, thigh, or hip brought on by ambulation: x Neurogenic   Pain in feet at night that wakes you up from your sleep:  x Neurogenic   Blood clot in your veins:    Leg swelling:  Pulmonary    Oxygen at home:    Productive cough:  x   Wheezing:         Neurologic    Sudden weakness in arms or legs:  x   Sudden numbness in arms or legs:  x   Sudden onset of difficulty speaking or slurred speech:    Temporary loss of vision in one eye:     Problems with dizziness:  x       Gastrointestinal    Blood in stool:  x   Vomited blood:         Genitourinary    Burning when urinating:     Blood in urine:        Psychiatric    Major depression:  x       Hematologic    Bleeding problems: x   Problems with blood clotting too easily: x       Skin    Rashes or ulcers:        Constitutional    Fever or chills:      PHYSICAL EXAM: Filed Vitals:   06/24/15 0858  BP: 115/75  Pulse: 73  Temp: 97.8 F (36.6 C)  Resp: 16  Height: 5\' 7"  (1.702 m)  Weight: 230 lb (104.327 kg)  SpO2: 100%    GENERAL:  The patient is a well-nourished female, in no acute distress. The vital signs are documented above. CARDIAC: There is a regular rate and rhythm.  VASCULAR: Palpable radial and palpable dorsalis pedis pulses bilaterally  PULMONARY: There is good air exchange bilaterally without wheezing or rales. ABDOMEN: Soft and non-tender with normal pitched bowel sounds. Obese MUSCULOSKELETAL: There are no major deformities or cyanosis. NEUROLOGIC: No focal weakness or paresthesias are detected. SKIN: There are no ulcers or rashes noted. PSYCHIATRIC: The patient has a normal affect.   MEDICAL ISSUES: Had long discussion with patient and her mother present regarding my role for exposure. Exposed mobilization of intraperitoneal contents, left ureter and arterial and venous structures overlying the spine. Explain the potential injury for all of these. She's had minimal prior abdominal surgery so that should not be an issue regarding adhesions. She is morbidly obese and explained that this would make surgery somewhat more difficult but not prohibitive. She has no atherosclerotic disease. I did explain that occasionally are venous injuries with significant blood loss. She does not want transfusion even if this would be indicated. Is comfortable with allowing Cell Saver to be used. We will assure that this is present for the surgery tomorrow. The patient and her mother have questions regarding disability, recovery, work release, etc. I have a referred these questions to Dr. Yetta BarreJones. Explained my role is technically exposing anterior surface of her spine for spinal fusion. She understands and wishes to proceed with surgery tomorrow   Madyson Lukach [facility] [place]: [phone]

## 2015-06-25 ENCOUNTER — Inpatient Hospital Stay (HOSPITAL_COMMUNITY)
Admission: RE | Admit: 2015-06-25 | Discharge: 2015-06-27 | DRG: 460 | Disposition: A | Discharge status: Home / Self Care | Payer: Medicaid Other | Source: Ambulatory Visit | Attending: Neurological Surgery | Admitting: Neurological Surgery

## 2015-06-25 ENCOUNTER — Inpatient Hospital Stay (HOSPITAL_COMMUNITY): Payer: Medicaid Other | Admitting: Anesthesiology

## 2015-06-25 ENCOUNTER — Inpatient Hospital Stay (HOSPITAL_COMMUNITY): Payer: Medicaid Other

## 2015-06-25 ENCOUNTER — Encounter (HOSPITAL_COMMUNITY): Payer: Self-pay | Admitting: Anesthesiology

## 2015-06-25 ENCOUNTER — Encounter (HOSPITAL_COMMUNITY)
Admission: RE | Disposition: A | Discharge status: Home / Self Care | Payer: Self-pay | Source: Ambulatory Visit | Attending: Neurological Surgery

## 2015-06-25 DIAGNOSIS — F329 Major depressive disorder, single episode, unspecified: Secondary | ICD-10-CM | POA: Diagnosis present

## 2015-06-25 DIAGNOSIS — M5136 Other intervertebral disc degeneration, lumbar region: Secondary | ICD-10-CM

## 2015-06-25 DIAGNOSIS — F17210 Nicotine dependence, cigarettes, uncomplicated: Secondary | ICD-10-CM | POA: Diagnosis present

## 2015-06-25 DIAGNOSIS — E669 Obesity, unspecified: Secondary | ICD-10-CM | POA: Diagnosis present

## 2015-06-25 DIAGNOSIS — Z7951 Long term (current) use of inhaled steroids: Secondary | ICD-10-CM | POA: Diagnosis not present

## 2015-06-25 DIAGNOSIS — I10 Essential (primary) hypertension: Secondary | ICD-10-CM | POA: Diagnosis present

## 2015-06-25 DIAGNOSIS — M961 Postlaminectomy syndrome, not elsewhere classified: Secondary | ICD-10-CM | POA: Diagnosis not present

## 2015-06-25 DIAGNOSIS — M5137 Other intervertebral disc degeneration, lumbosacral region: Secondary | ICD-10-CM | POA: Diagnosis present

## 2015-06-25 DIAGNOSIS — Z981 Arthrodesis status: Secondary | ICD-10-CM

## 2015-06-25 DIAGNOSIS — Z419 Encounter for procedure for purposes other than remedying health state, unspecified: Secondary | ICD-10-CM

## 2015-06-25 DIAGNOSIS — M545 Low back pain: Secondary | ICD-10-CM | POA: Diagnosis present

## 2015-06-25 HISTORY — PX: ANTERIOR LUMBAR FUSION: SHX1170

## 2015-06-25 HISTORY — PX: ABDOMINAL EXPOSURE: SHX5708

## 2015-06-25 SURGERY — ANTERIOR LUMBAR FUSION 1 LEVEL
Anesthesia: General | Site: Abdomen

## 2015-06-25 MED ORDER — SENNOSIDES-DOCUSATE SODIUM 8.6-50 MG PO TABS
1.0000 | ORAL_TABLET | Freq: Two times a day (BID) | ORAL | Status: DC
  Administered 2015-06-25 – 2015-06-27 (×4): 1 via ORAL
  Filled 2015-06-25 (×4): qty 1

## 2015-06-25 MED ORDER — METHOCARBAMOL 500 MG PO TABS
500.0000 mg | ORAL_TABLET | Freq: Four times a day (QID) | ORAL | Status: DC | PRN
  Administered 2015-06-25 – 2015-06-27 (×5): 500 mg via ORAL
  Filled 2015-06-25 (×4): qty 1

## 2015-06-25 MED ORDER — METHOCARBAMOL 500 MG PO TABS
ORAL_TABLET | ORAL | Status: AC
  Filled 2015-06-25: qty 1

## 2015-06-25 MED ORDER — VECURONIUM BROMIDE 10 MG IV SOLR
INTRAVENOUS | Status: DC | PRN
  Administered 2015-06-25: 4 mg via INTRAVENOUS
  Administered 2015-06-25: 2 mg via INTRAVENOUS

## 2015-06-25 MED ORDER — CELECOXIB 200 MG PO CAPS
200.0000 mg | ORAL_CAPSULE | Freq: Two times a day (BID) | ORAL | Status: DC
  Administered 2015-06-25 – 2015-06-27 (×5): 200 mg via ORAL
  Filled 2015-06-25 (×5): qty 1

## 2015-06-25 MED ORDER — ONDANSETRON HCL 4 MG/2ML IJ SOLN
INTRAMUSCULAR | Status: AC
  Filled 2015-06-25: qty 2

## 2015-06-25 MED ORDER — 0.9 % SODIUM CHLORIDE (POUR BTL) OPTIME
TOPICAL | Status: DC | PRN
  Administered 2015-06-25: 1000 mL

## 2015-06-25 MED ORDER — SODIUM CHLORIDE 0.9 % IJ SOLN
INTRAMUSCULAR | Status: AC
  Filled 2015-06-25: qty 10

## 2015-06-25 MED ORDER — HYDROMORPHONE HCL 1 MG/ML IJ SOLN
INTRAMUSCULAR | Status: AC
  Filled 2015-06-25: qty 1

## 2015-06-25 MED ORDER — LIDOCAINE HCL (CARDIAC) 20 MG/ML IV SOLN
INTRAVENOUS | Status: DC | PRN
  Administered 2015-06-25: 100 mg via INTRAVENOUS

## 2015-06-25 MED ORDER — ROCURONIUM BROMIDE 50 MG/5ML IV SOLN
INTRAVENOUS | Status: AC
  Filled 2015-06-25: qty 1

## 2015-06-25 MED ORDER — HYDROMORPHONE HCL 1 MG/ML IJ SOLN
0.2500 mg | INTRAMUSCULAR | Status: DC | PRN
  Administered 2015-06-25 (×2): 0.5 mg via INTRAVENOUS

## 2015-06-25 MED ORDER — DULOXETINE HCL 30 MG PO CPEP
30.0000 mg | ORAL_CAPSULE | Freq: Every day | ORAL | Status: DC
  Administered 2015-06-25 – 2015-06-27 (×3): 30 mg via ORAL
  Filled 2015-06-25 (×3): qty 1

## 2015-06-25 MED ORDER — SUGAMMADEX SODIUM 200 MG/2ML IV SOLN
INTRAVENOUS | Status: AC
  Filled 2015-06-25: qty 2

## 2015-06-25 MED ORDER — POTASSIUM CHLORIDE IN NACL 20-0.9 MEQ/L-% IV SOLN
INTRAVENOUS | Status: DC
  Filled 2015-06-25 (×5): qty 1000

## 2015-06-25 MED ORDER — SODIUM CHLORIDE 0.9% FLUSH: 3.0000 mL | INTRAVENOUS | Status: DC | PRN

## 2015-06-25 MED ORDER — ACETAMINOPHEN 10 MG/ML IV SOLN
INTRAVENOUS | Status: AC
  Administered 2015-06-25: 1000 mg via INTRAVENOUS
  Filled 2015-06-25: qty 100

## 2015-06-25 MED ORDER — SODIUM CHLORIDE 0.9% FLUSH
3.0000 mL | Freq: Two times a day (BID) | INTRAVENOUS | Status: DC
  Administered 2015-06-25 (×2): 3 mL via INTRAVENOUS

## 2015-06-25 MED ORDER — ACETAMINOPHEN 650 MG RE SUPP: 650.0000 mg | RECTAL | Status: DC | PRN

## 2015-06-25 MED ORDER — CHLORHEXIDINE GLUCONATE 4 % EX LIQD: 60.0000 mL | Freq: Once | CUTANEOUS | Status: DC

## 2015-06-25 MED ORDER — SUFENTANIL CITRATE 50 MCG/ML IV SOLN
INTRAVENOUS | Status: AC
  Filled 2015-06-25: qty 1

## 2015-06-25 MED ORDER — OXYCODONE-ACETAMINOPHEN 5-325 MG PO TABS
1.0000 | ORAL_TABLET | ORAL | Status: DC | PRN
  Administered 2015-06-25 – 2015-06-26 (×4): 2 via ORAL
  Filled 2015-06-25 (×3): qty 2

## 2015-06-25 MED ORDER — THROMBIN 20000 UNITS EX SOLR
CUTANEOUS | Status: DC | PRN
  Administered 2015-06-25: 20 mL via TOPICAL

## 2015-06-25 MED ORDER — PROCHLORPERAZINE EDISYLATE 5 MG/ML IJ SOLN
10.0000 mg | Freq: Once | INTRAMUSCULAR | Status: AC | PRN
  Administered 2015-06-25: 10 mg via INTRAVENOUS
  Filled 2015-06-25: qty 2

## 2015-06-25 MED ORDER — SODIUM CHLORIDE 0.9 % IR SOLN
Status: DC | PRN
  Administered 2015-06-25: 500 mL

## 2015-06-25 MED ORDER — DEXTROSE 5 % IV SOLN
500.0000 mg | Freq: Four times a day (QID) | INTRAVENOUS | Status: DC | PRN
  Filled 2015-06-25: qty 5

## 2015-06-25 MED ORDER — ONDANSETRON HCL 4 MG/2ML IJ SOLN: 4.0000 mg | INTRAMUSCULAR | Status: DC | PRN

## 2015-06-25 MED ORDER — ONDANSETRON HCL 4 MG/2ML IJ SOLN
INTRAMUSCULAR | Status: DC | PRN
  Administered 2015-06-25: 4 mg via INTRAVENOUS

## 2015-06-25 MED ORDER — PROPOFOL 10 MG/ML IV BOLUS
INTRAVENOUS | Status: DC | PRN
  Administered 2015-06-25: 160 mg via INTRAVENOUS

## 2015-06-25 MED ORDER — ROCURONIUM BROMIDE 100 MG/10ML IV SOLN
INTRAVENOUS | Status: DC | PRN
  Administered 2015-06-25: 50 mg via INTRAVENOUS

## 2015-06-25 MED ORDER — LIDOCAINE HCL (CARDIAC) 20 MG/ML IV SOLN
INTRAVENOUS | Status: AC
  Filled 2015-06-25: qty 5

## 2015-06-25 MED ORDER — CEFAZOLIN SODIUM 1-5 GM-% IV SOLN
1.0000 g | Freq: Three times a day (TID) | INTRAVENOUS | Status: AC
  Administered 2015-06-25 (×2): 1 g via INTRAVENOUS
  Filled 2015-06-25: qty 50

## 2015-06-25 MED ORDER — SUGAMMADEX SODIUM 200 MG/2ML IV SOLN
INTRAVENOUS | Status: DC | PRN
  Administered 2015-06-25: 200 mg via INTRAVENOUS

## 2015-06-25 MED ORDER — PROPOFOL 10 MG/ML IV BOLUS
INTRAVENOUS | Status: AC
  Filled 2015-06-25: qty 20

## 2015-06-25 MED ORDER — MENTHOL 3 MG MT LOZG: 1.0000 | LOZENGE | OROMUCOSAL | Status: DC | PRN

## 2015-06-25 MED ORDER — PHENOL 1.4 % MT LIQD: 1.0000 | OROMUCOSAL | Status: DC | PRN

## 2015-06-25 MED ORDER — OXYCODONE-ACETAMINOPHEN 5-325 MG PO TABS
ORAL_TABLET | ORAL | Status: AC
  Filled 2015-06-25: qty 2

## 2015-06-25 MED ORDER — LACTATED RINGERS IV SOLN
INTRAVENOUS | Status: DC | PRN
  Administered 2015-06-25 (×3): via INTRAVENOUS

## 2015-06-25 MED ORDER — THROMBIN 5000 UNITS EX SOLR
CUTANEOUS | Status: DC | PRN
  Administered 2015-06-25: 5 mL via TOPICAL

## 2015-06-25 MED ORDER — MORPHINE SULFATE (PF) 2 MG/ML IV SOLN
1.0000 mg | INTRAVENOUS | Status: DC | PRN
  Administered 2015-06-25: 4 mg via INTRAVENOUS
  Filled 2015-06-25: qty 2

## 2015-06-25 MED ORDER — MIDAZOLAM HCL 5 MG/5ML IJ SOLN
INTRAMUSCULAR | Status: DC | PRN
  Administered 2015-06-25: 2 mg via INTRAVENOUS

## 2015-06-25 MED ORDER — SUFENTANIL CITRATE 50 MCG/ML IV SOLN
INTRAVENOUS | Status: DC | PRN
  Administered 2015-06-25: 15 ug via INTRAVENOUS
  Administered 2015-06-25: 10 ug via INTRAVENOUS
  Administered 2015-06-25: 5 ug via INTRAVENOUS
  Administered 2015-06-25: 10 ug via INTRAVENOUS
  Administered 2015-06-25 (×2): 5 ug via INTRAVENOUS

## 2015-06-25 MED ORDER — ACETAMINOPHEN 325 MG PO TABS: 650.0000 mg | ORAL_TABLET | ORAL | Status: DC | PRN

## 2015-06-25 MED ORDER — LACTATED RINGERS IV SOLN
INTRAVENOUS | Status: DC | PRN
  Administered 2015-06-25: 09:00:00 via INTRAVENOUS

## 2015-06-25 MED ORDER — MIDAZOLAM HCL 2 MG/2ML IJ SOLN
INTRAMUSCULAR | Status: AC
  Filled 2015-06-25: qty 2

## 2015-06-25 MED FILL — Heparin Sodium (Porcine) Inj 1000 Unit/ML: INTRAMUSCULAR | Qty: 30 | Status: AC

## 2015-06-25 MED FILL — Sodium Chloride IV Soln 0.9%: INTRAVENOUS | Qty: 1000 | Status: AC

## 2015-06-25 SURGICAL SUPPLY — 114 items
ADH SKN CLS APL DERMABOND .7 (GAUZE/BANDAGES/DRESSINGS) ×1
APL SKNCLS STERI-STRIP NONHPOA (GAUZE/BANDAGES/DRESSINGS)
APPLIER CLIP 11 MED OPEN (CLIP) ×3
APR CLP MED 11 20 MLT OPN (CLIP) ×1
BAG DECANTER FOR FLEXI CONT (MISCELLANEOUS) ×3 IMPLANT
BENZOIN TINCTURE PRP APPL 2/3 (GAUZE/BANDAGES/DRESSINGS) IMPLANT
BONE MATRIX OSTEOCEL PRO MED (Bone Implant) ×2 IMPLANT
BUR EGG ELITE 5.0 (BURR) ×1 IMPLANT
BUR EGG ELITE 5.0MM (BURR) ×1
BUR MATCHSTICK NEURO 3.0 LAGG (BURR) ×2 IMPLANT
CANISTER SUCT 3000ML PPV (MISCELLANEOUS) ×3 IMPLANT
CLIP APPLIE 11 MED OPEN (CLIP) ×1 IMPLANT
CLOSURE WOUND 1/2 X4 (GAUZE/BANDAGES/DRESSINGS)
COVER BACK TABLE 24X17X13 BIG (DRAPES) IMPLANT
DERMABOND ADVANCED (GAUZE/BANDAGES/DRESSINGS) ×2
DERMABOND ADVANCED .7 DNX12 (GAUZE/BANDAGES/DRESSINGS) IMPLANT
DRAPE C-ARM 42X72 X-RAY (DRAPES) ×5 IMPLANT
DRAPE C-ARMOR (DRAPES) ×2 IMPLANT
DRAPE INCISE IOBAN 66X45 STRL (DRAPES) IMPLANT
DRAPE LAPAROTOMY 100X72X124 (DRAPES) ×3 IMPLANT
DRAPE POUCH INSTRU U-SHP 10X18 (DRAPES) ×3 IMPLANT
DURAPREP 26ML APPLICATOR (WOUND CARE) ×3 IMPLANT
ELECT BLADE 4.0 EZ CLEAN MEGAD (MISCELLANEOUS) ×3
ELECT REM PT RETURN 9FT ADLT (ELECTROSURGICAL) ×3
ELECTRODE BLDE 4.0 EZ CLN MEGD (MISCELLANEOUS) ×1 IMPLANT
ELECTRODE REM PT RTRN 9FT ADLT (ELECTROSURGICAL) ×1 IMPLANT
GAUZE SPONGE 4X4 12PLY STRL (GAUZE/BANDAGES/DRESSINGS) ×1 IMPLANT
GAUZE SPONGE 4X4 16PLY XRAY LF (GAUZE/BANDAGES/DRESSINGS) IMPLANT
GLOVE BIO SURGEON STRL SZ 6.5 (GLOVE) IMPLANT
GLOVE BIO SURGEON STRL SZ7 (GLOVE) IMPLANT
GLOVE BIO SURGEON STRL SZ8 (GLOVE) ×8 IMPLANT
GLOVE BIO SURGEON STRL SZ8.5 (GLOVE) IMPLANT
GLOVE BIO SURGEONS STRL SZ 6.5 (GLOVE)
GLOVE BIOGEL PI IND STRL 8.5 (GLOVE) IMPLANT
GLOVE BIOGEL PI INDICATOR 8.5 (GLOVE) ×2
GLOVE ECLIPSE 6.5 STRL STRAW (GLOVE) IMPLANT
GLOVE ECLIPSE 7.0 STRL STRAW (GLOVE) IMPLANT
GLOVE ECLIPSE 7.5 STRL STRAW (GLOVE) IMPLANT
GLOVE ECLIPSE 8.5 STRL (GLOVE) ×2 IMPLANT
GLOVE INDICATOR 6.5 STRL GRN (GLOVE) IMPLANT
GLOVE INDICATOR 7.0 STRL GRN (GLOVE) IMPLANT
GLOVE INDICATOR 8.0 STRL GRN (GLOVE) IMPLANT
GLOVE OPTIFIT SS 6.5 STRL BRWN (GLOVE) IMPLANT
GLOVE OPTIFIT SS 7.0 STRL BRWN (GLOVE)
GLOVE OPTIFIT SS 7.5 STRL LX (GLOVE) IMPLANT
GLOVE OPTIFIT SS 8.0 STRL (GLOVE) IMPLANT
GLOVE OPTIFIT SS 8.5 STRL (GLOVE) IMPLANT
GLOVE SS BIOGEL STRL SZ 7.5 (GLOVE) ×1 IMPLANT
GLOVE SS N UNI LF 7.5 STRL (GLOVE) IMPLANT
GLOVE SUPERSENSE BIOGEL SZ 7.5 (GLOVE) ×2
GLOVE SURG SS PI 6.5 STRL IVOR (GLOVE) IMPLANT
GLOVE SURG SS PI 7.0 STRL IVOR (GLOVE) IMPLANT
GLOVE SURG SS PI 7.5 STRL IVOR (GLOVE) IMPLANT
GLOVE SURG SS PI 8.0 STRL IVOR (GLOVE) IMPLANT
GLOVE SURG SS PI 8.5 STRL IVOR (GLOVE)
GLOVE SURG SS PI 8.5 STRL STRW (GLOVE) IMPLANT
GOWN STRL NON-REIN LRG LVL3 (GOWN DISPOSABLE) ×1 IMPLANT
GOWN STRL REUS W/ TWL LRG LVL3 (GOWN DISPOSABLE) IMPLANT
GOWN STRL REUS W/ TWL XL LVL3 (GOWN DISPOSABLE) IMPLANT
GOWN STRL REUS W/TWL 2XL LVL3 (GOWN DISPOSABLE) ×3 IMPLANT
GOWN STRL REUS W/TWL LRG LVL3 (GOWN DISPOSABLE) ×6
GOWN STRL REUS W/TWL XL LVL3 (GOWN DISPOSABLE) ×12
HEMOSTAT POWDER KIT SURGIFOAM (HEMOSTASIS) ×2 IMPLANT
IMPL BRIG 12X34X24-12 SPINE (Spacer) IMPLANT
IMPLANT BRIG 12X34X24-12 SPINE (Spacer) ×3 IMPLANT
INSERT FOGARTY 61MM (MISCELLANEOUS) IMPLANT
INSERT FOGARTY SM (MISCELLANEOUS) IMPLANT
KIT BASIN OR (CUSTOM PROCEDURE TRAY) ×3 IMPLANT
KIT INFUSE XX SMALL 0.7CC (Orthopedic Implant) ×2 IMPLANT
KIT ROOM TURNOVER OR (KITS) ×4 IMPLANT
LIQUID BAND (GAUZE/BANDAGES/DRESSINGS) ×1 IMPLANT
LOOP VESSEL MAXI BLUE (MISCELLANEOUS) IMPLANT
LOOP VESSEL MINI RED (MISCELLANEOUS) IMPLANT
NDL SPNL 18GX3.5 QUINCKE PK (NEEDLE) ×1 IMPLANT
NEEDLE SPNL 18GX3.5 QUINCKE PK (NEEDLE) ×3 IMPLANT
NS IRRIG 1000ML POUR BTL (IV SOLUTION) ×3 IMPLANT
PACK LAMINECTOMY NEURO (CUSTOM PROCEDURE TRAY) ×3 IMPLANT
PAD ARMBOARD 7.5X6 YLW CONV (MISCELLANEOUS) ×12 IMPLANT
PIN FIX PLATE BRIGADE 12MM THD (Pin) ×2 IMPLANT
PLATE BRIGADE LORDOTIC 34MM (Plate) ×2 IMPLANT
SCREW BRIGADE 5.5X27.5MM (Screw) ×4 IMPLANT
SCREW BRIGADE 5X5X25.0MM (Screw) ×4 IMPLANT
SPONGE INTESTINAL PEANUT (DISPOSABLE) ×6 IMPLANT
SPONGE LAP 18X18 X RAY DECT (DISPOSABLE) ×3 IMPLANT
SPONGE LAP 4X18 X RAY DECT (DISPOSABLE) IMPLANT
STAPLER VISISTAT 35W (STAPLE) IMPLANT
STRIP CLOSURE SKIN 1/2X4 (GAUZE/BANDAGES/DRESSINGS) IMPLANT
SUT MNCRL AB 4-0 PS2 18 (SUTURE) ×1 IMPLANT
SUT PROLENE 4 0 RB 1 (SUTURE)
SUT PROLENE 4-0 RB1 .5 CRCL 36 (SUTURE) ×4 IMPLANT
SUT PROLENE 5 0 CC1 (SUTURE) IMPLANT
SUT PROLENE 6 0 C 1 30 (SUTURE) ×1 IMPLANT
SUT PROLENE 6 0 CC (SUTURE) IMPLANT
SUT SILK 0 TIES 10X30 (SUTURE) ×1 IMPLANT
SUT SILK 2 0 TIES 10X30 (SUTURE) ×4 IMPLANT
SUT SILK 2 0SH CR/8 30 (SUTURE) IMPLANT
SUT SILK 3 0 TIES 10X30 (SUTURE) ×1 IMPLANT
SUT SILK 3 0 TIES 17X18 (SUTURE)
SUT SILK 3 0SH CR/8 30 (SUTURE) IMPLANT
SUT SILK 3-0 18XBRD TIE BLK (SUTURE) IMPLANT
SUT VIC AB 0 CT1 18XCR BRD8 (SUTURE) ×1 IMPLANT
SUT VIC AB 0 CT1 27 (SUTURE) ×6
SUT VIC AB 0 CT1 27XBRD ANBCTR (SUTURE) ×3 IMPLANT
SUT VIC AB 0 CT1 8-18 (SUTURE)
SUT VIC AB 2-0 CP2 18 (SUTURE) ×3 IMPLANT
SUT VIC AB 2-0 CT1 36 (SUTURE) ×1 IMPLANT
SUT VIC AB 3-0 SH 27 (SUTURE)
SUT VIC AB 3-0 SH 27X BRD (SUTURE) ×1 IMPLANT
SUT VIC AB 3-0 SH 8-18 (SUTURE) ×5 IMPLANT
SUT VICRYL 4-0 PS2 18IN ABS (SUTURE) ×1 IMPLANT
TOWEL OR 17X24 6PK STRL BLUE (TOWEL DISPOSABLE) ×8 IMPLANT
TOWEL OR 17X26 10 PK STRL BLUE (TOWEL DISPOSABLE) ×2 IMPLANT
TRAY FOLEY W/METER SILVER 14FR (SET/KITS/TRAYS/PACK) ×3 IMPLANT
WATER STERILE IRR 1000ML POUR (IV SOLUTION) ×3 IMPLANT

## 2015-06-25 NOTE — Anesthesia Preprocedure Evaluation (Addendum)
Anesthesia Evaluation  Patient identified by MRN, date of birth, ID band Patient awake    Reviewed: Allergy & Precautions, NPO status , Patient's Chart, lab work & pertinent test results  Airway Mallampati: II  TM Distance: >3 FB Neck ROM: Full    Dental no notable dental hx. (+) Teeth Intact, Dental Advisory Given   Pulmonary shortness of breath, Current Smoker,  CXR 06-17-15: No acute abnormality   Pulmonary exam normal breath sounds clear to auscultation       Cardiovascular hypertension, Normal cardiovascular exam Rhythm:Regular Rate:Normal  ECG: NSR with sinus arrhythmia.   Neuro/Psych  Headaches, PSYCHIATRIC DISORDERS Depression  Neuromuscular disease    GI/Hepatic negative GI ROS, Neg liver ROS,   Endo/Other  negative endocrine ROS  Renal/GU negative Renal ROS  negative genitourinary   Musculoskeletal  (+) Arthritis , Fibromyalgia -  Abdominal (+) + obese,   Peds negative pediatric ROS (+)  Hematology negative hematology ROS (+)   Anesthesia Other Findings   Reproductive/Obstetrics negative OB ROS                           Anesthesia Physical Anesthesia Plan  ASA: II  Anesthesia Plan: General   Post-op Pain Management:    Induction: Intravenous  Airway Management Planned: Oral ETT  Additional Equipment:   Intra-op Plan:   Post-operative Plan: Extubation in OR  Informed Consent: I have reviewed the patients History and Physical, chart, labs and discussed the procedure including the risks, benefits and alternatives for the proposed anesthesia with the patient or authorized representative who has indicated his/her understanding and acceptance.   Dental advisory given  Plan Discussed with: CRNA  Anesthesia Plan Comments:         Anesthesia Quick Evaluation

## 2015-06-25 NOTE — Op Note (Signed)
    OPERATIVE REPORT  DATE OF SURGERY: 06/25/2015  PATIENT: Carol Gross, 35 y.o. female MRN: [id number]  DOB: 1980/05/07  PRE-OPERATIVE DIAGNOSIS: Degenerative disc disease L5-S1  POST-OPERATIVE DIAGNOSIS:  Same  PROCEDURE: Anterior exposure for L5-S1 degenerative disc disease surgery  SURGEON:  Gretta Beganodd Tawona Filsinger, M.D.  Co-surgeon for the exposure Dr. Marikay Alaravid Jones  ANESTHESIA:  Gen.  EBL: Minimal ml  Total I/O In: 2740 [P.O.:240; I.V.:2500] Out: 260 [Urine:160; Blood:100]  BLOOD ADMINISTERED: None  DRAINS: None  SPECIMEN: None  COUNTS CORRECT:  YES  PLAN OF CARE: PACU   PATIENT DISPOSITION:  PACU - hemodynamically stable  PROCEDURE DETAILS: Patient was taken to the operating placed supine position where the area the abdomen prepped and draped in usual sterile fashion. Lateral C-arm projection reveal the level of the L5-S1 disc. An incision was made in the lower abdomen from the mid line to the left transversely. This was carried down to the subcutaneous fat to the level of the fascia. The fat was mobilized off the anterior rectus sheath. The anterior rectus sheath was opened in line with the skin incision. The rectus muscle was mobilized circumferentially. The retroperitoneal space was entered laterally and the anterior peritoneal contents were mobilized to the right. The peritoneal sac was not entered. The left ureter was identified and was mobilized to the right. The iliac vessels were exposed. Blunt dissection over the L5-S1 disc was used to mobilize the iliac arteries and veins. Middle sacral artery and middle sacral vein were ligated with ligaclips and divided. Blunt dissection date adequate exposure to the L5-S1 disc. The Thompson retractor was brought onto the field and the reverse lip 150 blades were positioned to the right and left of the L5-S1 disc. The malleable retractors were used for superior and inferior retraction. Spinal needle was placed in the L5-S1 disc and the  C-arm was brought back onto the field confirm this was the correct level. The remainder the procedure will be dictated as a separate note by Dr. Marcelyn Dittyavid Jones   Hamlin Devine, M.D. 06/25/2015 2:26 PM

## 2015-06-25 NOTE — Op Note (Signed)
06/25/2015  11:04 AM  PATIENT:  Carol Gross  35 y.o. female  PRE-OPERATIVE DIAGNOSIS:  DDD/ post laminectomy syndrome L5-S1  POST-OPERATIVE DIAGNOSIS:  Same  PROCEDURE:  Anterior lumbar interbody fusion L5-S1 utilizing a peek interbody cage packed with morcellized allograft followed by anterior lumbar plating L5-S1 utilizing a nuvasive plate  SURGEON:  Marikay Alaravid Zelda Reames, MD  Co-surgeon: Dr. early  ASSISTANTS: Dr. Danielle DessElsner  ANESTHESIA:   General  EBL: 100 ml  Total I/O In: 1400 [I.V.:1400] Out: 235 [Urine:135; Blood:100]  BLOOD ADMINISTERED:none  DRAINS: None   SPECIMEN:  No Specimen  INDICATION FOR PROCEDURE: This patient underwent a previous left L5-S1 microdiscectomy elsewhere. She presented with severe back pain. MRI showed degenerative disc disease L5-S1 with no evidence of recurrent disc herniation. She tried medical management without relief. I recommended anterior lumbar interbody fusion. Patient understood the risks, benefits, and alternatives and potential outcomes and wished to proceed.  PROCEDURE DETAILS: The patient was taken to the operating room and after induction of adequate generalized endotracheal anesthesia she was placed in the supine position on the operating room table. The exposure was performed by Dr. early of vascular surgery and that will be dictated in a separate operative report. Once the exposure was completed the midline was marked with a needle and AP fluoroscopy. I then marked the superior and inferior vertebral body. I then incised the disc space and performed initial discectomy with pituitary rongeurs. I then released the disc from the endplate with a Cobb curet utilizing lateral fluoroscopy. I then removed further disc. The disc was very degenerated. I then prepared the endplates for fusion with a high-speed drill. I distracted the disc space up to 14 mm to help with the disc space prep. I also prepared the endplates with curettes. I then used  sequential trials. The 12 mm x 12 trial fit the best. We then packed a corresponding cage with morcellized allograft and tapped it in position at L5-S1 utilizing lateral fluoroscopy. We then used a 34 mm plate and placed 25 mm screws into the L5 vertebral body and 27.5 monitor screws into the sacrum. These locked into the plate. Then checked our final construct with AP and lateral fluoroscopy. We removed the retractor. There was no obvious bleeding. Then checked our final AP x-ray. We got a reading back from the radiologist. We then closed the fascia with a running 0 Vicryl. Closed the subcutaneous tissues with 2-0 Vicryl. Closed the subcuticular tissue with interrupted 3-0 Vicryl. The skin was closed with Dermabond. The drapes were removed and the patient was awakened from general anesthesia and transported to the recovery room in stable condition. At the end of the procedure were all sponge instrument and needle initial counts were correct.  PLAN OF CARE: Admit to inpatient   PATIENT DISPOSITION:  PACU - hemodynamically stable.   Delay start of Pharmacological VTE agent (>24hrs) due to surgical blood loss or risk of bleeding:  yes

## 2015-06-25 NOTE — Transfer of Care (Signed)
Immediate Anesthesia Transfer of Care Note  Patient: Carol Gross  Procedure(s) Performed: Procedure(s): ANTERIOR LUMBAR INTERBODY  FUSION LUMBAR FIVE -SACRAL ONE (N/A) ABDOMINAL EXPOSURE (N/A)  Patient Location: PACU  Anesthesia Type:General  Level of Consciousness: awake, alert , oriented and patient cooperative  Airway & Oxygen Therapy: Patient Spontanous Breathing and Patient connected to nasal cannula oxygen  Post-op Assessment: Report given to RN and Post -op Vital signs reviewed and stable  Post vital signs: Reviewed  Last Vitals:  Filed Vitals:   06/25/15 0712  BP: 133/96  Pulse: 67  Temp: 36.7 C  Resp: 16    Last Pain:  Filed Vitals:   06/25/15 0714  PainSc: 10-Worst pain ever      Patients Stated Pain Goal: 9 (06/25/15 0713)  Complications: No apparent anesthesia complications

## 2015-06-25 NOTE — H&P (Signed)
Subjective: Patient is a [age] y.o. female admitted for ALIF. Onset of symptoms was several months ago, gradually worsening since that time.  The pain is rated severe, and is located at the across the lower back and radiates to leg. The pain is described as aching and occurs all day. The symptoms have been progressive. Symptoms are exacerbated by exercise. MRI or CT showed DDD/ HNP L5-S1   Past Medical History  Diagnosis Date  . Depression   . Carpal tunnel syndrome   . Back pain     sees ortho  . Chronic back pain   . Shortness of breath     exertion  . Headache(784.0)   . Hypertension     previously on. d/c'd by dr over month ago  . Arthritis   . Fibromyalgia     ?    Past Surgical History  Procedure Laterality Date  . Tubal ligation    . Wisdom tooth extraction    . Lumbar laminectomy/decompression microdiscectomy  03/03/2012    Procedure: LUMBAR LAMINECTOMY/DECOMPRESSION MICRODISCECTOMY 1 LEVEL;  Surgeon: Karn CassisErnesto M Botero, MD;  Location: [facility];  Service: Neurosurgery;  Laterality: Left;  Left Lumbar five sacral one Diskectomy    Prior to Admission medications   Medication Sig Start Date End Date Taking? Authorizing Provider  DULoxetine (CYMBALTA) 30 MG capsule Take 1 capsule (30 mg total) by mouth daily. 06/11/15  Yes Ashly M Gottschalk, DO  fluticasone (FLONASE) 50 MCG/ACT nasal spray Place 1 spray into both nostrils daily.   Yes Historical Provider, MD  HYDROcodone-acetaminophen (NORCO/VICODIN) 5-325 MG tablet Take 1 tablet by mouth every 6 (six) hours as needed for moderate pain.  05/26/15  Yes Historical Provider, MD  ibuprofen (ADVIL,MOTRIN) 200 MG tablet Take 400 mg by mouth 2 (two) times daily as needed for moderate pain. Reported on 06/24/2015   Yes Historical Provider, MD   Allergies  Allergen Reactions  . Other     No blood jehovah's witness    Social History  Substance Use Topics  . Smoking status: Current Every Day Smoker -- 0.30 packs/day for 10 years   Types: Cigarettes  . Smokeless tobacco: Never Used     Comment: decreased smoking  . Alcohol Use: 0.0 oz/week    0 Standard drinks or equivalent per week     Comment: occasional/social    History reviewed. No pertinent family history.   Review of Systems  Positive ROS: neg  All other systems have been reviewed and were otherwise negative with the exception of those mentioned in the HPI and as above.  Objective: Vital signs in last 24 hours: Temp:  [97.8 F (36.6 C)-98 F (36.7 C)] 98 F (36.7 C) (04/26 0712) Pulse Rate:  [67-73] 67 (04/26 0712) Resp:  [16] 16 (04/26 0712) BP: (115-133)/(75-96) 133/96 mmHg (04/26 0712) SpO2:  [100 %] 100 % (04/26 0712) Weight:  [104.327 kg (230 lb)] 104.327 kg (230 lb) (04/25 0858)  General Appearance: Alert, cooperative, no distress, appears stated age Head: Normocephalic, without obvious abnormality, atraumatic Eyes: PERRL, conjunctiva/corneas clear, EOM's intact    Neck: Supple, symmetrical, trachea midline Back: Symmetric, no curvature, ROM normal, no CVA tenderness Lungs:  respirations unlabored Heart: Regular rate and rhythm Abdomen: Soft, non-tender Extremities: Extremities normal, atraumatic, no cyanosis or edema Pulses: 2+ and symmetric all extremities Skin: Skin color, texture, turgor normal, no rashes or lesions  NEUROLOGIC:   Mental status: Alert and oriented x4,  no aphasia, good attention span, fund of knowledge, and memory  Motor Exam - grossly normal Sensory Exam - grossly normal Reflexes: 1+ Coordination - grossly normal Gait - grossly normal Balance - grossly normal Cranial Nerves: I: smell Not tested  II: visual acuity  OS: nl    OD: nl  II: visual fields Full to confrontation  II: pupils Equal, round, reactive to light  III,VII: ptosis None  III,IV,VI: extraocular muscles  Full ROM  V: mastication Normal  V: facial light touch sensation  Normal  V,VII: corneal reflex  Present  VII: facial muscle function -  upper  Normal  VII: facial muscle function - lower Normal  VIII: hearing Not tested  IX: soft palate elevation  Normal  IX,X: gag reflex Present  XI: trapezius strength  5/5  XI: sternocleidomastoid strength 5/5  XI: neck flexion strength  5/5  XII: tongue strength  Normal    Data Review Lab Results  Component Value Date   WBC 9.5 06/17/2015   HGB 11.2* 06/17/2015   HCT 36.3 06/17/2015   MCV 80.1 06/17/2015   PLT 320 06/17/2015   Lab Results  Component Value Date   NA 138 06/17/2015   K 4.0 06/17/2015   CL 107 06/17/2015   CO2 22 06/17/2015   BUN 11 06/17/2015   CREATININE 1.21* 06/17/2015   GLUCOSE 91 06/17/2015   Lab Results  Component Value Date   INR 1.08 06/17/2015    Assessment/Plan: Patient admitted for ALIF L5-S1. Patient has failed a reasonable attempt at conservative therapy.  I explained the condition and procedure to the patient and answered any questions.  Patient wishes to proceed with procedure as planned. Understands risks/ benefits and typical outcomes of procedure.   Alethia Melendrez S 06/25/2015 8:14 AM

## 2015-06-25 NOTE — Anesthesia Procedure Notes (Signed)
Procedure Name: Intubation Date/Time: 06/25/2015 8:41 AM Performed by: Lovie CholOCK, Melvin Marmo K Pre-anesthesia Checklist: Patient identified, Emergency Drugs available, Suction available and Patient being monitored Patient Re-evaluated:Patient Re-evaluated prior to inductionOxygen Delivery Method: Circle System Utilized Preoxygenation: Pre-oxygenation with 100% oxygen Intubation Type: IV induction Ventilation: Mask ventilation without difficulty Laryngoscope Size: Miller and 2 Grade View: Grade I Tube type: Oral Tube size: 7.0 mm Number of attempts: 1 Airway Equipment and Method: Stylet Placement Confirmation: ETT inserted through vocal cords under direct vision,  positive ETCO2 and breath sounds checked- equal and bilateral Secured at: 21 cm Tube secured with: Tape Dental Injury: Teeth and Oropharynx as per pre-operative assessment

## 2015-06-25 NOTE — Anesthesia Postprocedure Evaluation (Signed)
Anesthesia Post Note  Patient: Carol Gross  Procedure(s) Performed: Procedure(s) (LRB): ANTERIOR LUMBAR INTERBODY  FUSION LUMBAR FIVE -SACRAL ONE (N/A) ABDOMINAL EXPOSURE (N/A)  Patient location during evaluation: PACU Anesthesia Type: General Level of consciousness: awake and alert Pain management: pain level controlled Vital Signs Assessment: post-procedure vital signs reviewed and stable Respiratory status: spontaneous breathing, nonlabored ventilation, respiratory function stable and patient connected to nasal cannula oxygen Cardiovascular status: blood pressure returned to baseline and stable Postop Assessment: no signs of nausea or vomiting Anesthetic complications: no    Last Vitals:  Filed Vitals:   06/25/15 1140 06/25/15 1159  BP: 135/73 118/73  Pulse: 78 60  Temp:  36.4 C  Resp: 17 18    Last Pain:  Filed Vitals:   06/25/15 1200  PainSc: 8                  Devine Klingel J

## 2015-06-26 ENCOUNTER — Encounter (HOSPITAL_COMMUNITY): Payer: Self-pay | Admitting: Neurological Surgery

## 2015-06-26 MED ORDER — ENOXAPARIN SODIUM 40 MG/0.4ML ~~LOC~~ SOLN
40.0000 mg | SUBCUTANEOUS | Status: DC
  Administered 2015-06-26: 40 mg via SUBCUTANEOUS
  Filled 2015-06-26 (×2): qty 0.4

## 2015-06-26 MED ORDER — HYDROCODONE-ACETAMINOPHEN 7.5-325 MG PO TABS
1.0000 | ORAL_TABLET | ORAL | Status: DC | PRN
  Administered 2015-06-26 – 2015-06-27 (×5): 2 via ORAL
  Filled 2015-06-26 (×5): qty 2

## 2015-06-26 NOTE — Evaluation (Signed)
Occupational Therapy Evaluation Patient Details Name: Carol Gross MRN: [id number] DOB: 10-08-1980 Today's Date: 06/26/2015    History of Present Illness Patient is a 35 y.o. female admitted for ALIF L5-S1. PMH: Depression, chronic back pain, arthritis, HTN, fibromyalgia    Clinical Impression   Patient presenting with decreased ADL and functional mobility independence secondary to above. Patient independent PTA. Patient currently functioning at an overall supervision level (sit to/from stand from EOB). Patient will benefit from acute OT to increase overall independence in the areas of ADLs, functional mobility, and overall safety in order to safely discharge home with assistance from family prn.  Pt unaware of back precautions, went over each precaution and demonstrated "do's and don'ts".      Follow Up Recommendations  No OT follow up;Supervision - Intermittent    Equipment Recommendations  3 in 1 bedside comode;Other (comment) (reacher and LH sponge)    Recommendations for Other Services  None at this time   Precautions / Restrictions Precautions Precautions: Fall;Back Precaution Comments: no brace ordered  Restrictions Weight Bearing Restrictions: No    Mobility Bed Mobility Overal bed mobility: Needs Assistance Bed Mobility: Supine to Sit     Supine to sit: Supervision     General bed mobility comments: Supervision for safety and cueing to adhere to back precautions   Transfers Overall transfer level: Needs assistance Equipment used: Rolling walker (2 wheeled) Transfers: Sit to/from Stand Sit to Stand: Supervision         General transfer comment: Supervision for safety. Pt able to perform sit to/from stands without RW assistance.     Balance Overall balance assessment: No apparent balance deficits (not formally assessed);Modified Independent    ADL Overall ADL's : Needs assistance/impaired Eating/Feeding: Set up;Sitting   Grooming: Set  up;Standing;Supervision/safety   Upper Body Bathing: Set up;Sitting   Lower Body Bathing: Set up;Supervison/ safety;Sit to/from stand   Upper Body Dressing : Set up;Sitting   Lower Body Dressing: Set up;Supervision/safety;Sit to/from stand   Toilet Transfer: Supervision/safety;RW;BSC   Toileting- ArchitectClothing Manipulation and Hygiene: Supervision/safety;Sit to/from Nurse, children'sstand     Tub/Shower Transfer Details (indicate cue type and reason): did not occur, but did discuss using of 3-in-1 in tub/shower Functional mobility during ADLs: Supervision/safety;Rolling walker General ADL Comments: Pt able to cross legs for LB ADLs. Pt may benefit from a reacher and LH sponge.     Vision Vision Assessment?: No apparent visual deficits          Pertinent Vitals/Pain Pain Assessment: 0-10 Pain Score: 8  Pain Location: Lower back radiating to left side  Pain Descriptors / Indicators: Aching;Grimacing;Guarding;Radiating Pain Intervention(s): Limited activity within patient's tolerance;Monitored during session;Repositioned;Patient requesting pain meds-RN notified     Hand Dominance Right   Extremity/Trunk Assessment Upper Extremity Assessment Upper Extremity Assessment: Overall WFL for tasks assessed   Lower Extremity Assessment Lower Extremity Assessment: Defer to PT evaluation   Cervical / Trunk Assessment Cervical / Trunk Assessment: Normal   Communication Communication Communication: No difficulties   Cognition Arousal/Alertness: Awake/alert Behavior During Therapy: WFL for tasks assessed/performed Overall Cognitive Status: Within Functional Limits for tasks assessed              Home Living Family/patient expects to be discharged to:: Private residence Living Arrangements: Spouse/significant other Available Help at Discharge: Family;Available 24 hours/day Type of Home: Apartment Home Access: Level entry     Home Layout: Multi-level Alternate Level Stairs-Number of Steps: 4  levels with 6 steps to get to each level.  Alternate  Level Stairs-Rails:  (Pt reports wall and rail on each set of steps) Bathroom Shower/Tub: Tub/shower unit;Curtain   Bathroom Toilet: Standard     Home Equipment: None   Prior Functioning/Environment Level of Independence: Independent     OT Diagnosis: Generalized weakness;Acute pain   OT Problem List: Decreased activity tolerance;Decreased safety awareness;Decreased knowledge of use of DME or AE;Decreased knowledge of precautions;Pain;Obesity   OT Treatment/Interventions: Self-care/ADL training;DME and/or AE instruction;Therapeutic activities;Patient/family education    OT Goals(Current goals can be found in the care plan section) Acute Rehab OT Goals Patient Stated Goal: Go home tomorrow, figure out why my face is red OT Goal Formulation: With patient Time For Goal Achievement: 07/03/15 Potential to Achieve Goals: Good ADL Goals Pt Will Perform Grooming: standing;with modified independence Pt Will Perform Lower Body Bathing: with modified independence;sit to/from stand;with adaptive equipment Pt Will Perform Lower Body Dressing: with modified independence;sit to/from stand;with adaptive equipment Pt Will Transfer to Toilet: with modified independence;ambulating;bedside commode Pt Will Perform Tub/Shower Transfer: Tub transfer;ambulating;3 in 1;with modified independence;rolling walker Additional ADL Goal #1: Pt will independently verbalize and adhere to back precautions during ADL and functional mobility   OT Frequency: Min 2X/week   Barriers to D/C: None known at this time   End of Session Equipment Utilized During Treatment: Rolling walker Nurse Communication: Mobility status;Other (comment);Patient requests pain meds (Pt with complaints of redness on face/cheeks, pt states she thinks it could be from a medication reaction)  Activity Tolerance: Patient tolerated treatment well Patient left: in chair;with call bell/phone  within reach;with nursing/sitter in room   Time: 4098-1191 OT Time Calculation (min): 28 min Charges:  OT General Charges $OT Visit: 1 Procedure OT Evaluation $OT Eval Moderate Complexity: 1 Procedure OT Treatments $Self Care/Home Management : 8-22 mins  Edwin Cap , MS, OTR/L, CLT Pager: [phone]  06/26/2015, 8:23 AM

## 2015-06-26 NOTE — Evaluation (Signed)
Physical Therapy Evaluation Patient Details Name: Carol Gross MRN: [id number] DOB: 1981-02-27 Today's Date: 06/26/2015   History of Present Illness  Patient is a 35 y.o. female admitted for ALIF L5-S1. PMH: Depression, chronic back pain, arthritis, HTN, fibromyalgia, lumbar decompression 2014  Clinical Impression  Patient is s/p above surgery resulting in the deficits listed below (see PT Problem List). Pt ambulated 150' with RW and completed stair training. Instructed pt in back precautions. Encouraged frequent ambulation. Pt may benefit from aquatic therapy once healed from surgery.  Patient will benefit from skilled PT to increase their independence and safety with mobility (while adhering to their precautions) to allow discharge to the venue listed below.     Follow Up Recommendations No PT follow up (pt may benefit from outpt PT, esp aquatic therapy, following check up with MD)    Equipment Recommendations  Rolling walker with 5" wheels;3in1 (PT)    Recommendations for Other Services OT consult     Precautions / Restrictions Precautions Precautions: Fall;Back Precaution Comments: no brace ordered  Restrictions Weight Bearing Restrictions: No      Mobility  Bed Mobility Overal bed mobility: Needs Assistance Bed Mobility: Supine to Sit;Sit to Supine     Supine to sit: Supervision Sit to supine: Supervision   General bed mobility comments: Supervision for safety and cueing to adhere to back precautions   Transfers Overall transfer level: Needs assistance Equipment used: Rolling walker (2 wheeled) Transfers: Sit to/from Stand Sit to Stand: Supervision         General transfer comment: Supervision for safety. Pt able to perform sit to/from stands without RW assistance.   Ambulation/Gait Ambulation/Gait assistance: Supervision Ambulation Distance (Feet): 150 Feet Assistive device: Rolling walker (2 wheeled) Gait Pattern/deviations: Decreased step length -  right;Decreased step length - left;Antalgic;Decreased weight shift to left;Step-through pattern   Gait velocity interpretation: Below normal speed for age/gender General Gait Details: 8/10 pain prior to ambulation, 7/10 with ambulation, distance limited by fatigue  Stairs Stairs: Yes Stairs assistance: Supervision Stair Management: One rail Left;Sideways;Step to pattern Number of Stairs: 3 General stair comments: verbal cues for technique  Wheelchair Mobility    Modified Rankin (Stroke Patients Only)       Balance Overall balance assessment: No apparent balance deficits (not formally assessed);Modified Independent                                           Pertinent Vitals/Pain Pain Assessment: 0-10 Pain Score: 7  Pain Location: Low back with radiation to LLE Pain Descriptors / Indicators: Sore;Radiating Pain Intervention(s): Limited activity within patient's tolerance;Monitored during session;Premedicated before session    Home Living Family/patient expects to be discharged to:: Private residence Living Arrangements: Spouse/significant other Available Help at Discharge: Family;Available 24 hours/day Type of Home: Apartment Home Access: Level entry     Home Layout: Multi-level Home Equipment: None      Prior Function Level of Independence: Independent               Hand Dominance   Dominant Hand: Right    Extremity/Trunk Assessment   Upper Extremity Assessment: Overall WFL for tasks assessed           Lower Extremity Assessment: LLE deficits/detail   LLE Deficits / Details: decr to light touch L foot, this is pre-existing  Cervical / Trunk Assessment: Normal  Communication   Communication: No difficulties  Cognition Arousal/Alertness: Awake/alert Behavior During Therapy: WFL for tasks assessed/performed Overall Cognitive Status: Within Functional Limits for tasks assessed                      General Comments       Exercises        Assessment/Plan    PT Assessment Patient needs continued PT services  PT Diagnosis Difficulty walking;Acute pain   PT Problem List Pain;Decreased mobility;Decreased activity tolerance;Decreased knowledge of precautions  PT Treatment Interventions Gait training;DME instruction;Therapeutic exercise;Therapeutic activities;Functional mobility training;Stair training;Patient/family education   PT Goals (Current goals can be found in the Care Plan section) Acute Rehab PT Goals Patient Stated Goal: to exercise, lose weight, get back to normal PT Goal Formulation: With patient/family Time For Goal Achievement: 07/03/15 Potential to Achieve Goals: Good    Frequency 7X/week   Barriers to discharge        Co-evaluation               End of Session Equipment Utilized During Treatment: Gait belt Activity Tolerance: Patient tolerated treatment well Patient left: in bed;with call bell/phone within reach;with family/visitor present Nurse Communication: Mobility status         Time: 1324-4010 PT Time Calculation (min) (ACUTE ONLY): 40 min   Charges:   PT Evaluation $PT Eval Low Complexity: 1 Procedure PT Treatments $Gait Training: 8-22 mins $Therapeutic Activity: 8-22 mins   PT G Codes:        Tamala Ser 06/26/2015, 9:58 AM [phone]

## 2015-06-26 NOTE — Progress Notes (Signed)
Patient ID: Carol Gross, female   DOB: November 18, 1980, [age] y.o.   MRN: [id number] Subjective: Patient reports appropriate back and abdominal soreness. No leg pain. Has been walking. Has flatus.  Objective: Vital signs in last 24 hours: Temp:  [97.5 F (36.4 C)-98.5 F (36.9 C)] 98.4 F (36.9 C) (04/27 0400) Pulse Rate:  [45-90] 50 (04/27 0400) Resp:  [17-33] 18 (04/27 0400) BP: (114-141)/(53-88) 114/53 mmHg (04/27 0400) SpO2:  [95 %-100 %] 100 % (04/27 0400)  Intake/Output from previous day: 04/26 0701 - 04/27 0700 In: 3700 [P.O.:1200; I.V.:2500] Out: 260 [Urine:160; Blood:100] Intake/Output this shift:    Neurologic: Grossly normal  Lab Results: Lab Results  Component Value Date   WBC 9.5 06/17/2015   HGB 11.2* 06/17/2015   HCT 36.3 06/17/2015   MCV 80.1 06/17/2015   PLT 320 06/17/2015   Lab Results  Component Value Date   INR 1.08 06/17/2015   BMET Lab Results  Component Value Date   NA 138 06/17/2015   K 4.0 06/17/2015   CL 107 06/17/2015   CO2 22 06/17/2015   GLUCOSE 91 06/17/2015   BUN 11 06/17/2015   CREATININE 1.21* 06/17/2015   CALCIUM 9.2 06/17/2015    Studies/Results: Dg Lumbar Spine 2-3 Views  06/25/2015  CLINICAL DATA:  Post lumbar fusion L5-S1 level EXAM: DG C-ARM 61-120 MIN; LUMBAR SPINE - 2-3 VIEW COMPARISON:  06/25/2015 intraoperative image FINDINGS: Two views of lumbar spine submitted. Again noted anterior metallic fusion with metallic plate and screws at L5-S1 level. The alignment is preserved. IMPRESSION: Anterior metallic fusion with metallic plate and screws at L5-S1 level. The alignment is preserved. Fluoroscopy time was 1 minutes and 22 seconds. Please see the operative report. Electronically Signed   By: Natasha MeadLiviu  Pop M.D.   On: 06/25/2015 11:36   Dg C-arm 61-120 Min  06/25/2015  CLINICAL DATA:  Post lumbar fusion L5-S1 level EXAM: DG C-ARM 61-120 MIN; LUMBAR SPINE - 2-3 VIEW COMPARISON:  06/25/2015 intraoperative image FINDINGS: Two views of  lumbar spine submitted. Again noted anterior metallic fusion with metallic plate and screws at L5-S1 level. The alignment is preserved. IMPRESSION: Anterior metallic fusion with metallic plate and screws at L5-S1 level. The alignment is preserved. Fluoroscopy time was 1 minutes and 22 seconds. Please see the operative report. Electronically Signed   By: Natasha MeadLiviu  Pop M.D.   On: 06/25/2015 11:36   Dg Or Local Abdomen  06/25/2015  CLINICAL DATA:  35 year old female with a history of anterior lumbar fusion, intraoperative image EXAM: OR LOCAL ABDOMEN COMPARISON:  None. FINDINGS: Single anterior intraoperative view demonstrates surgical changes of anterior lumbar interbody fusion with plate and screw fixation at L5-S1. Gas within the soft tissues of the left abdomen. No radiopaque foreign body. IMPRESSION: Intraoperative image demonstrating anterior lumbar interbody fusion of L5-S1 without complicating feature. These results were called by telephone at the time of interpretation on 06/25/2015 at 10:57 am to OR3, Ms Mick SellDorey Day, who verbally acknowledged these results. Signed, Yvone NeuJaime S. Loreta AveWagner, DO [facility] Electronically Signed   By: Gilmer MorJaime  Wagner D.O.   On: 06/25/2015 10:58    Assessment/Plan: Overall appears to be doing very well. Likely home tomorrow. Therapy today.   LOS: 1 day    Khyra Viscuso S 06/26/2015, 7:34 AM

## 2015-06-26 NOTE — Progress Notes (Signed)
Patient ID: Carol Gross, female   DOB: 26-Jan-1981, 35 y.o.   MRN: [id number] Looks great. Comfortable. No nausea. Has been up walking. Abdomen soft. 2-3+ dorsalis pedis pulses bilaterally. Doing well postop day 1. Will not follow actively. Please call if we can assist.

## 2015-06-26 NOTE — Progress Notes (Signed)
Pharmacy Re: Lovenox  Pharmacy asked to dose Lovenox for DVT prophylaxis s/p spinal surgery.  CrCl ~ 80 ml/min Wt = 104.3 kg  Will start Lovenox 40 mg daily.  Could consider 0.5 mg/kg q 24 hrs (50 mg), but given recent surgery will give 40 mg.  Tad MooreJessica Trejuan Matherne, Pharm D, BCPS  Clinical Pharmacist Pager [phone]  06/26/2015 7:53 AM

## 2015-06-27 MED ORDER — METHOCARBAMOL 500 MG PO TABS: 500.0000 mg | ORAL_TABLET | Freq: Four times a day (QID) | ORAL | Status: DC | PRN

## 2015-06-27 MED ORDER — PROMETHAZINE HCL 25 MG PO TABS
25.0000 mg | ORAL_TABLET | Freq: Four times a day (QID) | ORAL | Status: AC | PRN
  Administered 2015-06-27: 25 mg via ORAL
  Filled 2015-06-27: qty 1

## 2015-06-27 MED ORDER — MAGNESIUM CITRATE PO SOLN
1.0000 | Freq: Once | ORAL | Status: AC
  Administered 2015-06-27: 1 via ORAL
  Filled 2015-06-27: qty 296

## 2015-06-27 MED ORDER — HYDROCODONE-ACETAMINOPHEN 5-325 MG PO TABS: 1.0000 | ORAL_TABLET | ORAL | Status: DC | PRN

## 2015-06-27 MED ORDER — BISACODYL 5 MG PO TBEC: 5.0000 mg | DELAYED_RELEASE_TABLET | Freq: Every day | ORAL | Status: DC | PRN

## 2015-06-27 NOTE — Discharge Summary (Signed)
Physician Discharge Summary  Patient ID: Carol Gross MRN: [id number] DOB/AGE: [birth year] 35 y.o.  Admit date: 06/25/2015 Discharge date: 06/27/2015  Admission Diagnoses: Degenerative disc disease and failed back syndrome    Discharge Diagnoses: same   Discharged Condition: good  Hospital Course: The patient was admitted on 06/25/2015 and taken to the operating room where the patient underwent ALIF L5-S1. The patient tolerated the procedure well and was taken to the recovery room and then to the floor in stable condition. The hospital course was routine. There were no complications. The wound remained clean dry and intact. Pt had appropriate back soreness. No complaints of leg pain or new N/T/W. The patient remained afebrile with stable vital signs, and tolerated a regular diet. The patient continued to increase activities, and pain was well controlled with oral pain medications.   Consults: None  Significant Diagnostic Studies:  Results for orders placed or performed during the hospital encounter of 06/17/15  No blood products  Result Value Ref Range   Transfuse no blood products      TRANSFUSE NO BLOOD PRODUCTS, VERIFIED BY KAYE TEMPLES RN    Chest 2 View  06/17/2015  CLINICAL DATA:  Preoperative evaluation for lumbar degenerative disc disease EXAM: CHEST  2 VIEW COMPARISON:  October 04, 2008 FINDINGS: Lungs are clear. Heart size and pulmonary vascularity are normal. No adenopathy. No bone lesions. IMPRESSION: No abnormality noted. Electronically Signed   By: Bretta Bang III M.D.   On: 06/17/2015 16:05   Dg Lumbar Spine 2-3 Views  06/25/2015  CLINICAL DATA:  Post lumbar fusion L5-S1 level EXAM: DG C-ARM 61-120 MIN; LUMBAR SPINE - 2-3 VIEW COMPARISON:  06/25/2015 intraoperative image FINDINGS: Two views of lumbar spine submitted. Again noted anterior metallic fusion with metallic plate and screws at L5-S1 level. The alignment is preserved. IMPRESSION: Anterior metallic fusion  with metallic plate and screws at L5-S1 level. The alignment is preserved. Fluoroscopy time was 1 minutes and 22 seconds. Please see the operative report. Electronically Signed   By: Natasha Mead M.D.   On: 06/25/2015 11:36   US Transvaginal Non-ob  06/23/2015  CLINICAL DATA:  Menorrhagia with irregular cycle. Dysfunctional uterine bleeding. LMP 06/01/2015. EXAM: TRANSABDOMINAL AND TRANSVAGINAL ULTRASOUND OF PELVIS TECHNIQUE: Both transabdominal and transvaginal ultrasound examinations of the pelvis were performed. Transabdominal technique was performed for global imaging of the pelvis including uterus, ovaries, adnexal regions, and pelvic cul-de-sac. It was necessary to proceed with endovaginal exam following the transabdominal exam to visualize the endometrium and ovaries. COMPARISON:  06/23/2010 FINDINGS: Uterus Measurements: 9.7 x 4.4 x 6.1 cm. Retroflexed. No fibroids or other mass visualized. Endometrium Thickness: 12 mm.  No focal abnormality visualized. Right ovary Measurements: 2.9 x 2.1 x 2.0 cm. Normal appearance/no adnexal mass. Left ovary Measurements: 3.5 x 1.9 x 1.9 cm. Normal appearance/no adnexal mass. Other findings No abnormal free fluid. IMPRESSION: Retroverted uterus.  No evidence of fibroids. Endometrial thickness measures 12 mm. If bleeding remains unresponsive to hormonal or medical therapy, sonohysterogram should be considered for focal lesion work-up. (Ref: Radiological Reasoning: Algorithmic Workup of Abnormal Vaginal Bleeding with Endovaginal Sonography and Sonohysterography. AJR 2008; 409:W11-91). Normal appearance of both ovaries.  No adnexal mass identified. Electronically Signed   By: Myles Rosenthal M.D.   On: 06/23/2015 15:56   US Pelvis Complete  06/23/2015  CLINICAL DATA:  Menorrhagia with irregular cycle. Dysfunctional uterine bleeding. LMP 06/01/2015. EXAM: TRANSABDOMINAL AND TRANSVAGINAL ULTRASOUND OF PELVIS TECHNIQUE: Both transabdominal and transvaginal ultrasound  examinations of the  pelvis were performed. Transabdominal technique was performed for global imaging of the pelvis including uterus, ovaries, adnexal regions, and pelvic cul-de-sac. It was necessary to proceed with endovaginal exam following the transabdominal exam to visualize the endometrium and ovaries. COMPARISON:  06/23/2010 FINDINGS: Uterus Measurements: 9.7 x 4.4 x 6.1 cm. Retroflexed. No fibroids or other mass visualized. Endometrium Thickness: 12 mm.  No focal abnormality visualized. Right ovary Measurements: 2.9 x 2.1 x 2.0 cm. Normal appearance/no adnexal mass. Left ovary Measurements: 3.5 x 1.9 x 1.9 cm. Normal appearance/no adnexal mass. Other findings No abnormal free fluid. IMPRESSION: Retroverted uterus.  No evidence of fibroids. Endometrial thickness measures 12 mm. If bleeding remains unresponsive to hormonal or medical therapy, sonohysterogram should be considered for focal lesion work-up. (Ref: Radiological Reasoning: Algorithmic Workup of Abnormal Vaginal Bleeding with Endovaginal Sonography and Sonohysterography. AJR 2008; 161:W96-04). Normal appearance of both ovaries.  No adnexal mass identified. Electronically Signed   By: Myles Rosenthal M.D.   On: 06/23/2015 15:56   Dg C-arm 61-120 Min  06/25/2015  CLINICAL DATA:  Post lumbar fusion L5-S1 level EXAM: DG C-ARM 61-120 MIN; LUMBAR SPINE - 2-3 VIEW COMPARISON:  06/25/2015 intraoperative image FINDINGS: Two views of lumbar spine submitted. Again noted anterior metallic fusion with metallic plate and screws at L5-S1 level. The alignment is preserved. IMPRESSION: Anterior metallic fusion with metallic plate and screws at L5-S1 level. The alignment is preserved. Fluoroscopy time was 1 minutes and 22 seconds. Please see the operative report. Electronically Signed   By: Natasha Mead M.D.   On: 06/25/2015 11:36   Dg Or Local Abdomen  06/25/2015  CLINICAL DATA:  35 year old female with a history of anterior lumbar fusion, intraoperative image EXAM: OR  LOCAL ABDOMEN COMPARISON:  None. FINDINGS: Single anterior intraoperative view demonstrates surgical changes of anterior lumbar interbody fusion with plate and screw fixation at L5-S1. Gas within the soft tissues of the left abdomen. No radiopaque foreign body. IMPRESSION: Intraoperative image demonstrating anterior lumbar interbody fusion of L5-S1 without complicating feature. These results were called by telephone at the time of interpretation on 06/25/2015 at 10:57 am to OR3, Ms Mick Sell Day, who verbally acknowledged these results. Signed, Yvone Neu. Loreta Ave, DO [facility] Electronically Signed   By: Gilmer Mor D.O.   On: 06/25/2015 10:58    Antibiotics:  Anti-infectives    Start     Dose/Rate Route Frequency Ordered Stop   06/25/15 1630  ceFAZolin (ANCEF) IVPB 1 g/50 mL premix     1 g 100 mL/hr over 30 Minutes Intravenous Every 8 hours 06/25/15 1200 06/26/15 0001   06/25/15 0945  bacitracin 50,000 Units in sodium chloride irrigation 0.9 % 500 mL irrigation  Status:  Discontinued       As needed 06/25/15 0945 06/25/15 1104   06/25/15 0800  ceFAZolin (ANCEF) IVPB 2g/100 mL premix     2 g 200 mL/hr over 30 Minutes Intravenous To [facility] 06/24/15 1402 06/25/15 0842      Discharge Exam: Blood pressure 105/46, pulse 57, temperature 98.2 F (36.8 C), temperature source Oral, resp. rate 18, SpO2 100 %. Neurologic: Grossly normal Wound clean dry and intact  Discharge Medications:     Medication List    STOP taking these medications        ibuprofen 200 MG tablet  Commonly known as:  ADVIL,MOTRIN      TAKE these medications        DULoxetine 30 MG capsule  Commonly known  as:  CYMBALTA  Take 1 capsule (30 mg total) by mouth daily.     fluticasone 50 MCG/ACT nasal spray  Commonly known as:  FLONASE  Place 1 spray into both nostrils daily.     HYDROcodone-acetaminophen 5-325 MG tablet  Commonly known as:   NORCO/VICODIN  Take 1-2 tablets by mouth every 4 (four) hours as needed for moderate pain.     methocarbamol 500 MG tablet  Commonly known as:  ROBAXIN  Take 1 tablet (500 mg total) by mouth every 6 (six) hours as needed for muscle spasms.        Disposition: Home   Final Dx: ALIF L5-S1      Discharge Instructions    Call MD for:  difficulty breathing, headache or visual disturbances    Complete by:  As directed      Call MD for:  persistant nausea and vomiting    Complete by:  As directed      Call MD for:  redness, tenderness, or signs of infection (pain, swelling, redness, odor or green/yellow discharge around incision site)    Complete by:  As directed      Call MD for:  severe uncontrolled pain    Complete by:  As directed      Call MD for:  temperature >100.4    Complete by:  As directed      Diet - low sodium heart healthy    Complete by:  As directed      Discharge instructions    Complete by:  As directed   No lifting, no bending or twisting, no driving, no strenuous activity, may shower     Increase activity slowly    Complete by:  As directed      No wound care    Complete by:  As directed               Signed: Natalynn Pedone S 06/27/2015, 8:28 AM

## 2015-06-27 NOTE — Progress Notes (Signed)
Occupational Therapy Treatment Patient Details Name: Carol Gross MRN: [id number] DOB: [birth year] Today's Date: 06/27/2015    History of present illness Patient is a 35 y.o. female admitted for ALIF L5-S1. PMH: Depression, chronic back pain, arthritis, HTN, fibromyalgia, lumbar decompression 2014   OT comments  Pt making good progress toward OT goals. Educated pt on use of reacher and long handled sponge for increased independence and safety with ADLs and provided pt with AE; pt verbalized understanding. Educated pt on tub transfer technique with use of 3 in 1 and RW, provided handout; pt able to return demo simulation of tub transfer with supervision for safety. Pt able to recall 2/3 back precautions at start of session; reviewed all precautions. D/c plan remains appropriate. Will continue to follow acutely.   Follow Up Recommendations  No OT follow up;Supervision - Intermittent    Equipment Recommendations  3 in 1 bedside comode;Other (comment) (reacher, long handled sponge)    Recommendations for Other Services      Precautions / Restrictions Precautions Precautions: Fall;Back Precaution Comments: Pt able to verablly recall 2/3 back precautions at start of session. Reviewed all back precautions in relation to functional activities. Required Braces or Orthoses:  (No brace ordered) Restrictions Weight Bearing Restrictions: No       Mobility Bed Mobility               General bed mobility comments: Pt sitting EOB upon arrival.  Transfers Overall transfer level: Needs assistance Equipment used: Rolling walker (2 wheeled) Transfers: Sit to/from Stand Sit to Stand: Supervision         General transfer comment: Supervision for safety. VCs for hand placement with RW.     Balance Overall balance assessment: No apparent balance deficits (not formally assessed)                                 ADL Overall ADL's : Needs assistance/impaired                Lower Body Bathing Details (indicate cue type and reason): Educated on use of long handeled sponge for increased independence with LB bathing. Provided pt with long handled sponge for use upon return home,     Lower Body Dressing: Supervision/safety;Set up;Sit to/from stand Lower Body Dressing Details (indicate cue type and reason): Pt able to demo technique of crossing foot over opposite knee to avoid bending. Toilet Transfer: Supervision/safety;Ambulation;BSC;RW       Tub/ Shower Transfer: Supervision/safety;Ambulation;3 in 1;Rolling walker Tub/Shower Transfer Details (indicate cue type and reason): Educated pt on tub transfer technique with use of 3 in 1; provided handout. Pt able to return demo simulation of technique with supervision for safety. Functional mobility during ADLs: Supervision/safety;Rolling walker General ADL Comments: Educated pt on use of reacher and provided her with one for use at home. Educated on home safety; supervision for tub transfers upon return home.       Vision                     Perception     Praxis      Cognition   Behavior During Therapy: [facility] for tasks assessed/performed Overall Cognitive Status: Within Functional Limits for tasks assessed       Memory: Decreased recall of precautions               Extremity/Trunk Assessment  Exercises     Shoulder Instructions       General Comments      Pertinent Vitals/ Pain       Pain Assessment: 0-10 Pain Score: 7  Pain Location: Low back +nausea Pain Descriptors / Indicators: Aching;Sore Pain Intervention(s): Limited activity within patient's tolerance;Monitored during session;Premedicated before session  Home Living                                          Prior Functioning/Environment              Frequency Min 2X/week     Progress Toward Goals  OT Goals(current goals can now be found in the care plan section)   Progress towards OT goals: Progressing toward goals  Acute Rehab OT Goals Patient Stated Goal: to exercise, lose weight, get back to normal  Plan Discharge plan remains appropriate    Co-evaluation                 End of Session Equipment Utilized During Treatment: Rolling walker   Activity Tolerance Patient tolerated treatment well   Patient Left with call bell/phone within reach;with family/visitor present (sitting EOB)   Nurse Communication          Time: 1610-9604 OT Time Calculation (min): 12 min  Charges: OT General Charges $OT Visit: 1 Procedure OT Treatments $Self Care/Home Management : 8-22 mins  Gaye Alken M.S., OTR/L Pager: [phone]  06/27/2015, 8:41 AM

## 2015-06-27 NOTE — Progress Notes (Signed)
Physical Therapy Treatment Patient Details Name: Carol Gross MRN: [id number] DOB: 1980/05/21 Today's Date: 06/27/2015    History of Present Illness Patient is a 35 y.o. female admitted for ALIF L5-S1. PMH: Depression, chronic back pain, arthritis, HTN, fibromyalgia, lumbar decompression 2014    PT Comments    Pt progressing well towards physical therapy goals. Continues to appear very guarded with mobility and is overall moving slow but steady. Pt expressed concerns regarding stair negotiation and bed mobility today and time was taken for pt to practice. At end of session pt reports feeling comfortable and confident in transferring in/out of bed and negotiating stairs. Will continue to follow until d/c - pt anticipates home this morning.  Follow Up Recommendations  Outpatient PT;Supervision for mobility/OOB     Equipment Recommendations  Rolling walker with 5" wheels;3in1 (PT)    Recommendations for Other Services       Precautions / Restrictions Precautions Precautions: Fall;Back Precaution Comments: Pt able to verablly recall 2/3 back precautions at start of session. Reviewed all back precautions in relation to functional activities. Required Braces or Orthoses:  (No brace ordered) Restrictions Weight Bearing Restrictions: No    Mobility  Bed Mobility Overal bed mobility: Needs Assistance Bed Mobility: Rolling;Sidelying to Sit;Sit to Sidelying Rolling: Supervision Sidelying to sit: Supervision     Sit to sidelying: Supervision General bed mobility comments: Gross superivsion for safety. Pt was cued for general sequencing and technique. Pt able to complete with HOB flat and rails lowered to simulate home environment.   Transfers Overall transfer level: Needs assistance Equipment used: None Transfers: Sit to/from Stand Sit to Stand: Supervision         General transfer comment: Supervision for safety. VCs for hand placement on seated surface for safety.    Ambulation/Gait Ambulation/Gait assistance: Supervision Ambulation Distance (Feet): 200 Feet Assistive device: Rolling walker (2 wheeled) Gait Pattern/deviations: Step-through pattern;Decreased stride length Gait velocity: Decreased Gait velocity interpretation: Below normal speed for age/gender General Gait Details: Slow and guarded but fairly steady with RW. Pt was cued for increased gait speed.    Stairs Stairs: Yes Stairs assistance: Min guard Stair Management: One rail Left;Sideways Number of Stairs: 6 General stair comments: verbal cues for technique  Wheelchair Mobility    Modified Rankin (Stroke Patients Only)       Balance Overall balance assessment: No apparent balance deficits (not formally assessed)                                  Cognition Arousal/Alertness: Awake/alert Behavior During Therapy: WFL for tasks assessed/performed Overall Cognitive Status: Within Functional Limits for tasks assessed       Memory: Decreased recall of precautions              Exercises      General Comments        Pertinent Vitals/Pain Pain Assessment: Faces Pain Score: 7  Faces Pain Scale: Hurts even more Pain Location: back Pain Descriptors / Indicators: Operative site guarding;Sore Pain Intervention(s): Limited activity within patient's tolerance;Monitored during session;Repositioned    Home Living                      Prior Function            PT Goals (current goals can now be found in the care plan section) Acute Rehab PT Goals Patient Stated Goal: to exercise, lose weight, get back  to normal PT Goal Formulation: With patient/family Time For Goal Achievement: 07/03/15 Potential to Achieve Goals: Good Progress towards PT goals: Progressing toward goals    Frequency  7X/week    PT Plan Current plan remains appropriate    Co-evaluation             End of Session Equipment Utilized During Treatment: Gait  belt Activity Tolerance: Patient tolerated treatment well Patient left: in bed;with call bell/phone within reach;with family/visitor present     Time: 7124-5809 PT Time Calculation (min) (ACUTE ONLY): 21 min  Charges:  $Gait Training: 8-22 mins                    G Codes:      Conni Slipper 06/29/2015, 9:34 AM  Conni Slipper, PT, DPT Acute Rehabilitation Services Pager: [phone]

## 2015-06-27 NOTE — Progress Notes (Signed)
Patient alert and oriented, mae's well, voiding adequate amount of urine, swallowing without difficulty, c/o mild pain. Patient discharged home with family. Script and discharged instructions given to patient. Patient and family stated understanding of d/c instructions given and has an appointment with MD.   

## 2015-07-29 ENCOUNTER — Ambulatory Visit (INDEPENDENT_AMBULATORY_CARE_PROVIDER_SITE_OTHER): Payer: Medicaid Other | Admitting: Family Medicine

## 2015-07-29 ENCOUNTER — Telehealth: Payer: Self-pay | Admitting: *Deleted

## 2015-07-29 ENCOUNTER — Encounter: Payer: Self-pay | Admitting: Family Medicine

## 2015-07-29 ENCOUNTER — Other Ambulatory Visit: Payer: Self-pay | Admitting: Family Medicine

## 2015-07-29 VITALS — BP 130/70 | HR 72 | Temp 98.5°F | Wt 230.2 lb

## 2015-07-29 DIAGNOSIS — D692 Other nonthrombocytopenic purpura: Secondary | ICD-10-CM

## 2015-07-29 DIAGNOSIS — F321 Major depressive disorder, single episode, moderate: Secondary | ICD-10-CM

## 2015-07-29 LAB — CBC WITH DIFFERENTIAL/PLATELET
BASOS PCT: 1 %
Basophils Absolute: 68 cells/uL (ref 0–200)
Eosinophils Absolute: 68 cells/uL (ref 15–500)
Eosinophils Relative: 1 %
HEMATOCRIT: 34.4 % — AB (ref 35.0–45.0)
HEMOGLOBIN: 10.6 g/dL — AB (ref 11.7–15.5)
LYMPHS ABS: 2448 {cells}/uL (ref 850–3900)
Lymphocytes Relative: 36 %
MCH: 24.3 pg — ABNORMAL LOW (ref 27.0–33.0)
MCHC: 30.8 g/dL — ABNORMAL LOW (ref 32.0–36.0)
MCV: 78.9 fL — ABNORMAL LOW (ref 80.0–100.0)
MONO ABS: 408 {cells}/uL (ref 200–950)
MPV: 9.2 fL (ref 7.5–12.5)
Monocytes Relative: 6 %
NEUTROS PCT: 56 %
Neutro Abs: 3808 cells/uL (ref 1500–7800)
Platelets: 336 10*3/uL (ref 140–400)
RBC: 4.36 MIL/uL (ref 3.80–5.10)
RDW: 16.7 % — ABNORMAL HIGH (ref 11.0–15.0)
WBC: 6.8 10*3/uL (ref 3.8–10.8)

## 2015-07-29 MED ORDER — DULOXETINE HCL 30 MG PO CPEP: 30.0000 mg | ORAL_CAPSULE | Freq: Every day | ORAL | Status: DC

## 2015-07-29 NOTE — Telephone Encounter (Signed)
Patient calling requesting refill on cymbalta. [facility]

## 2015-07-29 NOTE — Progress Notes (Signed)
    Subjective   Carol Gross is a 35 y.o. female that presents for a same day visit  1. Rash: Patient first noticed her rash about 6 weeks ago. The rash started on her left leg and progressed to her right leg. She has associated muscle pain described as throbbing that is located mainly in upper thighs. No associated fever, nausea or vomiting. New medication include hydrocodone and robaxin  ROS Per HPI  Social History  Substance Use Topics  . Smoking status: Current Every Day Smoker -- 0.30 packs/day for 10 years    Types: Cigarettes  . Smokeless tobacco: Never Used     Comment: decreased smoking  . Alcohol Use: 0.0 oz/week    0 Standard drinks or equivalent per week     Comment: occasional/social    Allergies  Allergen Reactions  . Other     No blood jehovah's witness    Objective   BP 130/70 mmHg  Pulse 72  Temp(Src) 98.5 F (36.9 C) (Oral)  Wt 230 lb 3.2 oz (104.418 kg)  LMP 07/25/2015  General: Well appearing, no distress Skin: Multiple small purpuric lesions on bilateral thighs and legs that are non-tender.  Assessment and Plan   1. Purpura (HCC) Unsure of what is causing this. Will get CBC and coagulation labs. Also, patient may need to return for biopsy of lesions so a more definitive answer can be had. Patient without recent weight loss, night sweats, chills, fevers. Return precautions discussed. Follow-up with PCP - CBC with Differential/Platelet - Pathologist smear review - Protime-INR

## 2015-07-29 NOTE — Patient Instructions (Signed)
Thank you for coming to see me today. It was a pleasure. Today we talked about:   Rash: this is called petechiae. I will get some labs to start the work up of this.   Please make an appointment to see Dr. Nadine CountsGottschalk for follow-up.  If you have any questions or concerns, please do not hesitate to call the office at [phone].  Sincerely,  Jacquelin Hawkingalph Daviel Allegretto, MD

## 2015-07-29 NOTE — Telephone Encounter (Signed)
Done

## 2015-07-30 LAB — PATHOLOGIST SMEAR REVIEW

## 2015-07-30 LAB — PROTIME-INR
INR: 1 (ref <1.50)
Prothrombin Time: 13.3 seconds (ref 11.6–15.2)

## 2015-08-23 ENCOUNTER — Encounter (HOSPITAL_COMMUNITY): Payer: Self-pay

## 2015-08-23 ENCOUNTER — Emergency Department (HOSPITAL_COMMUNITY)
Admission: EM | Admit: 2015-08-23 | Discharge: 2015-08-24 | Disposition: A | Discharge status: Home / Self Care | Payer: Medicaid Other | Attending: Emergency Medicine | Admitting: Emergency Medicine

## 2015-08-23 DIAGNOSIS — I10 Essential (primary) hypertension: Secondary | ICD-10-CM | POA: Insufficient documentation

## 2015-08-23 DIAGNOSIS — Z79899 Other long term (current) drug therapy: Secondary | ICD-10-CM | POA: Insufficient documentation

## 2015-08-23 DIAGNOSIS — F17210 Nicotine dependence, cigarettes, uncomplicated: Secondary | ICD-10-CM | POA: Insufficient documentation

## 2015-08-23 DIAGNOSIS — R1084 Generalized abdominal pain: Secondary | ICD-10-CM | POA: Diagnosis present

## 2015-08-23 DIAGNOSIS — A084 Viral intestinal infection, unspecified: Secondary | ICD-10-CM | POA: Diagnosis not present

## 2015-08-23 LAB — CBC
HEMATOCRIT: 36.4 % (ref 36.0–46.0)
HEMOGLOBIN: 11.5 g/dL — AB (ref 12.0–15.0)
MCH: 23.9 pg — ABNORMAL LOW (ref 26.0–34.0)
MCHC: 31.6 g/dL (ref 30.0–36.0)
MCV: 75.5 fL — ABNORMAL LOW (ref 78.0–100.0)
Platelets: 376 10*3/uL (ref 150–400)
RBC: 4.82 MIL/uL (ref 3.87–5.11)
RDW: 16.5 % — ABNORMAL HIGH (ref 11.5–15.5)
WBC: 10.9 10*3/uL — ABNORMAL HIGH (ref 4.0–10.5)

## 2015-08-23 LAB — COMPREHENSIVE METABOLIC PANEL
ALBUMIN: 4.4 g/dL (ref 3.5–5.0)
ALK PHOS: 73 U/L (ref 38–126)
ALT: 13 U/L — ABNORMAL LOW (ref 14–54)
ANION GAP: 12 (ref 5–15)
AST: 24 U/L (ref 15–41)
BILIRUBIN TOTAL: 0.9 mg/dL (ref 0.3–1.2)
BUN: 9 mg/dL (ref 6–20)
CALCIUM: 10.2 mg/dL (ref 8.9–10.3)
CO2: 20 mmol/L — AB (ref 22–32)
Chloride: 107 mmol/L (ref 101–111)
Creatinine, Ser: 1.06 mg/dL — ABNORMAL HIGH (ref 0.44–1.00)
GFR calc non Af Amer: >60 mL/min (ref >60)
Glucose, Bld: 115 mg/dL — ABNORMAL HIGH (ref 65–99)
POTASSIUM: 3.6 mmol/L (ref 3.5–5.1)
SODIUM: 139 mmol/L (ref 135–145)
TOTAL PROTEIN: 7.5 g/dL (ref 6.5–8.1)

## 2015-08-23 LAB — URINALYSIS, ROUTINE W REFLEX MICROSCOPIC
Bilirubin Urine: NEGATIVE
Glucose, UA: NEGATIVE mg/dL
Hgb urine dipstick: NEGATIVE
Ketones, ur: >80 mg/dL — AB
LEUKOCYTES UA: NEGATIVE
NITRITE: NEGATIVE
PH: 8.5 — AB (ref 5.0–8.0)
Protein, ur: 30 mg/dL — AB
SPECIFIC GRAVITY, URINE: 1.022 (ref 1.005–1.030)

## 2015-08-23 LAB — URINE MICROSCOPIC-ADD ON

## 2015-08-23 LAB — I-STAT BETA HCG BLOOD, ED (MC, WL, AP ONLY)

## 2015-08-23 LAB — LIPASE, BLOOD: Lipase: 18 U/L (ref 11–51)

## 2015-08-23 MED ORDER — SODIUM CHLORIDE 0.9 % IV BOLUS (SEPSIS)
1000.0000 mL | Freq: Once | INTRAVENOUS | Status: AC
  Administered 2015-08-23: 1000 mL via INTRAVENOUS

## 2015-08-23 MED ORDER — ONDANSETRON HCL 4 MG/2ML IJ SOLN
4.0000 mg | Freq: Once | INTRAMUSCULAR | Status: AC
  Administered 2015-08-23: 4 mg via INTRAVENOUS
  Filled 2015-08-23: qty 2

## 2015-08-23 MED ORDER — METOCLOPRAMIDE HCL 5 MG/ML IJ SOLN
10.0000 mg | Freq: Once | INTRAMUSCULAR | Status: AC
  Administered 2015-08-23: 10 mg via INTRAVENOUS
  Filled 2015-08-23: qty 2

## 2015-08-23 MED ORDER — DIPHENHYDRAMINE HCL 50 MG/ML IJ SOLN
25.0000 mg | Freq: Once | INTRAMUSCULAR | Status: AC
  Administered 2015-08-23: 25 mg via INTRAVENOUS
  Filled 2015-08-23: qty 1

## 2015-08-23 NOTE — ED Provider Notes (Signed)
CSN: [id number]     Arrival date & time 08/23/15  2142 History   First MD Initiated Contact with Patient 08/23/15 2240     Chief Complaint  Patient presents with  . Abdominal Pain     (Consider location/radiation/quality/duration/timing/severity/associated sxs/prior Treatment) HPI Comments: Patient with a history of SOB, HTN, chronic back pain, fibromyalgia, presents with abdominal cramping, diarrhea and vomiting since this morning. No symptoms yesterday. No known exposure to bad food or others with similar symptoms. No fever but she reports hot/cold chills. Emesis has been TNTC episodes that are non-bloody. Diarrhea also non-bloody.   Patient is a 35 y.o. female presenting with abdominal pain. The history is provided by the patient. No language interpreter was used.  Abdominal Pain Pain location:  Generalized Pain quality: cramping   Pain severity:  Moderate Onset quality:  Gradual Duration:  1 day Chronicity:  New Associated symptoms: chills, diarrhea, nausea, shortness of breath and vomiting   Associated symptoms: no fever     Past Medical History  Diagnosis Date  . Depression   . Carpal tunnel syndrome   . Back pain     sees ortho  . Chronic back pain   . Shortness of breath     exertion  . Headache(784.0)   . Hypertension     previously on. d/c'd by dr over month ago  . Arthritis   . Fibromyalgia     ?   Past Surgical History  Procedure Laterality Date  . Tubal ligation    . Wisdom tooth extraction    . Lumbar laminectomy/decompression microdiscectomy  03/03/2012    Procedure: LUMBAR LAMINECTOMY/DECOMPRESSION MICRODISCECTOMY 1 LEVEL;  Surgeon: Karn CassisErnesto M Botero, MD;  Location: [facility];  Service: Neurosurgery;  Laterality: Left;  Left Lumbar five sacral one Diskectomy  . Anterior lumbar fusion N/A 06/25/2015    Procedure: ANTERIOR LUMBAR INTERBODY  FUSION LUMBAR FIVE -SACRAL ONE;  Surgeon: Tia Alertavid S Jones, MD;  Location: [facility];  Service: Neurosurgery;   Laterality: N/A;  . Abdominal exposure N/A 06/25/2015    Procedure: ABDOMINAL EXPOSURE;  Surgeon: Larina Earthlyodd F Early, MD;  Location: [facility];  Service: Vascular;  Laterality: N/A;   No family history on file. Social History  Substance Use Topics  . Smoking status: Current Every Day Smoker -- 0.30 packs/day for 10 years    Types: Cigarettes  . Smokeless tobacco: Never Used     Comment: decreased smoking  . Alcohol Use: 0.0 oz/week    0 Standard drinks or equivalent per week     Comment: occasional/social   OB History    Gravida Para Term Preterm AB TAB SAB Ectopic Multiple Living   3 3        3      Review of Systems  Constitutional: Positive for chills. Negative for fever.  Respiratory: Positive for shortness of breath.   Cardiovascular: Negative.   Gastrointestinal: Positive for nausea, vomiting, abdominal pain and diarrhea.  Genitourinary: Negative.   Musculoskeletal: Negative.   Skin: Negative.   Neurological: Negative.       Allergies  Other  Home Medications   Prior to Admission medications   Medication Sig Start Date End Date Taking? Authorizing Provider  DULoxetine (CYMBALTA) 30 MG capsule Take 1 capsule (30 mg total) by mouth daily. 07/29/15  Yes Ashly Hulen SkainsM Gottschalk, DO  HYDROcodone-acetaminophen (NORCO/VICODIN) 5-325 MG tablet Take 1-2 tablets by mouth every 4 (four) hours as needed for moderate pain. 06/27/15  Yes Tia Alertavid S Jones, MD  meloxicam (MOBIC) 7.5 MG tablet Take 7.5 mg by mouth 2 (two) times daily. 08/04/15  Yes Historical Provider, MD  methocarbamol (ROBAXIN) 500 MG tablet Take 1 tablet (500 mg total) by mouth every 6 (six) hours as needed for muscle spasms. 06/27/15  Yes Tia Alertavid S Jones, MD   BP 173/99 mmHg  Pulse 53  Temp(Src)   Resp 36  SpO2 100%  LMP 07/25/2015 Physical Exam  Constitutional: She is oriented to person, place, and time. She appears well-developed and well-nourished.  Patient restless and uncomfortable appearing.   HENT:  Head:  Normocephalic.  Neck: Normal range of motion. Neck supple.  Cardiovascular: Normal rate and regular rhythm.   Pulmonary/Chest: Effort normal and breath sounds normal.  Hyperventilating, normal air entry, no wheezes.  Abdominal: Soft. Bowel sounds are normal. She exhibits no distension. There is tenderness (Diffuse abdominal tenderness. ). There is no rebound and no guarding.  Musculoskeletal: Normal range of motion.  Neurological: She is alert and oriented to person, place, and time.  Skin: Skin is warm and dry. No rash noted.  Psychiatric: She has a normal mood and affect.    ED Course  Procedures (including critical care time) Labs Review Labs Reviewed  COMPREHENSIVE METABOLIC PANEL - Abnormal; Notable for the following:    CO2 20 (*)    Glucose, Bld 115 (*)    Creatinine, Ser 1.06 (*)    ALT 13 (*)    All other components within normal limits  CBC - Abnormal; Notable for the following:    WBC 10.9 (*)    Hemoglobin 11.5 (*)    MCV 75.5 (*)    MCH 23.9 (*)    RDW 16.5 (*)    All other components within normal limits  LIPASE, BLOOD  URINALYSIS, ROUTINE W REFLEX MICROSCOPIC (NOT AT [facility])  I-STAT BETA HCG BLOOD, ED ([facility], [facility], [facility] ONLY)   Results for orders placed or performed during the hospital encounter of 08/23/15  Lipase, blood  Result Value Ref Range   Lipase 18 11 - 51 U/L  Comprehensive metabolic panel  Result Value Ref Range   Sodium 139 135 - 145 mmol/L   Potassium 3.6 3.5 - 5.1 mmol/L   Chloride 107 101 - 111 mmol/L   CO2 20 (L) 22 - 32 mmol/L   Glucose, Bld 115 (H) 65 - 99 mg/dL   BUN 9 6 - 20 mg/dL   Creatinine, Ser 1.611.06 (H) 0.44 - 1.00 mg/dL   Calcium 09.610.2 8.9 - 04.510.3 mg/dL   Total Protein 7.5 6.5 - 8.1 g/dL   Albumin 4.4 3.5 - 5.0 g/dL   AST 24 15 - 41 U/L   ALT 13 (L) 14 - 54 U/L   Alkaline Phosphatase 73 38 - 126 U/L   Total Bilirubin 0.9 0.3 - 1.2 mg/dL   GFR calc non Af Amer >60 >60 mL/min   GFR calc Af Amer >60 >60 mL/min   Anion gap 12 5 - 15   CBC  Result Value Ref Range   WBC 10.9 (H) 4.0 - 10.5 K/uL   RBC 4.82 3.87 - 5.11 MIL/uL   Hemoglobin 11.5 (L) 12.0 - 15.0 g/dL   HCT 40.936.4 81.136.0 - 91.446.0 %   MCV 75.5 (L) 78.0 - 100.0 fL   MCH 23.9 (L) 26.0 - 34.0 pg   MCHC 31.6 30.0 - 36.0 g/dL   RDW 78.216.5 (H) 95.611.5 - 21.315.5 %   Platelets 376 150 - 400 K/uL  Urinalysis, Routine w reflex microscopic  Result Value Ref Range   Color, Urine YELLOW YELLOW   APPearance CLOUDY (A) CLEAR   Specific Gravity, Urine 1.022 1.005 - 1.030   pH 8.5 (H) 5.0 - 8.0   Glucose, UA NEGATIVE NEGATIVE mg/dL   Hgb urine dipstick NEGATIVE NEGATIVE   Bilirubin Urine NEGATIVE NEGATIVE   Ketones, ur >80 (A) NEGATIVE mg/dL   Protein, ur 30 (A) NEGATIVE mg/dL   Nitrite NEGATIVE NEGATIVE   Leukocytes, UA NEGATIVE NEGATIVE  Urine microscopic-add on  Result Value Ref Range   Squamous Epithelial / LPF 6-30 (A) NONE SEEN   WBC, UA 0-5 0 - 5 WBC/hpf   RBC / HPF 0-5 0 - 5 RBC/hpf   Bacteria, UA FEW (A) NONE SEEN   Urine-Other MUCOUS PRESENT   I-Stat beta hCG blood, ED  Result Value Ref Range   I-stat hCG, quantitative <5.0 <5 mIU/mL   Comment 3            Imaging Review No results found. I have personally reviewed and evaluated these images and lab results as part of my medical decision-making.   EKG Interpretation None      MDM   Final diagnoses:  None   1. Viral gastroenteritis  Patient presents with abdominal cramping pain, diarrhea and vomiting. Labs are reassuring. REglan provided for nausea. She is tolerating PO fluids. VSS. She is felt appropriate for discharge home.     Elpidio Anis, PA-C 09/01/15 1610  Dione Booze, MD 09/01/15 [phone]

## 2015-08-23 NOTE — ED Notes (Signed)
Taken to collect specimen at this time.

## 2015-08-23 NOTE — ED Notes (Signed)
Patient complains of abdominal pain with vomiting and diarrhea since am. Patient hyperventilating on arrival. Diaphoretic on arrival

## 2015-08-24 MED ORDER — METOCLOPRAMIDE HCL 10 MG PO TABS: 10.0000 mg | ORAL_TABLET | Freq: Four times a day (QID) | ORAL | Status: DC

## 2015-08-24 NOTE — Discharge Instructions (Signed)

## 2015-09-03 ENCOUNTER — Ambulatory Visit: Payer: Medicaid Other | Admitting: Family Medicine

## 2015-10-06 ENCOUNTER — Other Ambulatory Visit: Payer: Self-pay | Admitting: Family Medicine

## 2015-10-06 ENCOUNTER — Encounter: Payer: Self-pay | Admitting: Family Medicine

## 2015-10-06 ENCOUNTER — Ambulatory Visit (INDEPENDENT_AMBULATORY_CARE_PROVIDER_SITE_OTHER): Payer: Medicaid Other | Admitting: Family Medicine

## 2015-10-06 VITALS — BP 131/68 | HR 92 | Temp 98.3°F | Ht 67.0 in | Wt 227.2 lb

## 2015-10-06 DIAGNOSIS — N921 Excessive and frequent menstruation with irregular cycle: Secondary | ICD-10-CM

## 2015-10-06 DIAGNOSIS — F321 Major depressive disorder, single episode, moderate: Secondary | ICD-10-CM

## 2015-10-06 DIAGNOSIS — E669 Obesity, unspecified: Secondary | ICD-10-CM

## 2015-10-06 DIAGNOSIS — Z0000 Encounter for general adult medical examination without abnormal findings: Secondary | ICD-10-CM | POA: Diagnosis not present

## 2015-10-06 DIAGNOSIS — Z720 Tobacco use: Secondary | ICD-10-CM

## 2015-10-06 MED ORDER — BUPROPION HCL ER (XL) 150 MG PO TB24: 150.0000 mg | ORAL_TABLET | Freq: Every day | ORAL | 2 refills | Status: DC

## 2015-10-06 MED ORDER — DULOXETINE HCL 30 MG PO CPEP: 30.0000 mg | ORAL_CAPSULE | Freq: Every day | ORAL | 5 refills | Status: DC

## 2015-10-06 NOTE — Patient Instructions (Signed)
I have placed a referral to gynecology for you to discuss your period and ultrasound.  Follow up with me in 3 months.   Tobacco Use Disorder Tobacco use disorder (TUD) is a mental disorder. It is the long-term use of tobacco in spite of related health problems or difficulty with normal life activities. Tobacco is most commonly smoked as cigarettes and less commonly as cigars or pipes. Smokeless chewing tobacco and snuff are also popular. People with TUD get a feeling of extreme pleasure (euphoria) from using tobacco and have a desire to use it again and again. Repeated use of tobacco can cause problems. The addictive effects of tobacco are due mainly tothe ingredient nicotine. Nicotine also causes a rush of adrenaline (epinephrine) in the body. This leads to increased blood pressure, heart rate, and breathing rate. These changes may cause problems for people with high blood pressure, weak hearts, or lung disease. High doses of nicotine in children and pets can lead to seizures and death.  Tobacco contains a number of other unsafe chemicals. These chemicals are especially harmful when inhaled as smoke and can damage almost every organ in the body. Smokers live shorter lives than nonsmokers and are at risk of dying from a number of diseases and cancers. Tobacco smoke can also cause health problems for nonsmokers (due to inhaling secondhand smoke). Smoking is also a fire hazard.  TUD usually starts in the late teenage years and is most common in young adults between the ages of 63 and 25 years. People who start smoking earlier in life are more likely to continue smoking as adults. TUD is somewhat more common in men than women. People with TUD are at higher risk for using alcohol and other drugs of abuse. RISK FACTORS Risk factors for TUD include:   Having family members with the disorder.  Being around people who use tobacco.  Having an existing mental health issue such as schizophrenia, depression,  bipolar disorder, ADHD, or posttraumatic stress disorder (PTSD). SIGNS AND SYMPTOMS  People with tobacco use disorder have two or more of the following signs and symptoms within 12 months:   Use of more tobacco over a longer period than intended.   Not able to cut down or control tobacco use.   A lot of time spent obtaining or using tobacco.   Strong desire or urge to use tobacco (craving). Cravings may last for 6 months or longer after quitting.  Use of tobacco even when use leads to major problems at work, school, or home.   Use of tobacco even when use leads to relationship problems.   Giving up or cutting down on important life activities because of tobacco use.   Repeatedly using tobacco in situations where it puts you or others in physical danger, like smoking in bed.   Use of tobacco even when it is known that a physical or mental problem is likely related to tobacco use.   Physical problems are numerous and may include chronic bronchitis, emphysema, lung and other cancers, gum disease, high blood pressure, heart disease, and stroke.   Mental problems caused by tobacco may include difficulty sleeping and anxiety.  Need to use greater amounts of tobacco to get the same effect. This means you have developed a tolerance.   Withdrawal symptoms as a result of stopping or rapidly cutting back use. These symptoms may last a month or more after quitting and include the following:   Depressed, anxious, or irritable mood.   Difficulty concentrating.  Increased appetite.  Restlessness or trouble sleeping.   Use of tobacco to avoid withdrawal symptoms. DIAGNOSIS  Tobacco use disorder is diagnosed by your health care provider. A diagnosis may be made by:  Your health care provider asking questions about your tobacco use and any problems it may be causing.  A physical exam.  Lab tests.  You may be referred to a mental health professional or addiction  specialist. The severity of tobacco use disorder depends on the number of signs and symptoms you have:   Mild--Two or three symptoms.  Moderate--Four or five symptoms.   Severe--Six or more symptoms.  TREATMENT  Many people with tobacco use disorder are unable to quit on their own and need help. Treatment options include the following:  Nicotine replacement therapy (NRT). NRT provides nicotine without the other harmful chemicals in tobacco. NRT gradually lowers the dosage of nicotine in the body and reduces withdrawal symptoms. NRT is available in over-the-counter forms (gum, lozenges, and skin patches) as well as prescription forms (mouth inhaler and nasal spray).  Medicines.This may include:  Antidepressant medicine that may reduce nicotine cravings.  A medicine that acts on nicotine receptors in the brain to reduce cravings and withdrawal symptoms. It may also block the effects of tobacco in people with TUD who relapse.  Counseling or talk therapy. A form of talk therapy called behavioral therapy is commonly used to treat people with TUD. Behavioral therapy looks at triggers for tobacco use, how to avoid them, and how to cope with cravings. It is most effective in person or by phone but is also available in self-help forms (books and Internet websites).  Support groups. These provide emotional support, advice, and guidance for quitting tobacco. The most effective treatment for TUD is usually a combination of medicine, talk therapy, and support groups. HOME CARE INSTRUCTIONS  Keep all follow-up visits as directed by your health care provider. This is important.  Take medicines only as directed by your health care provider.  Check with your health care provider before starting new prescription or over-the-counter medicines. SEEK MEDICAL CARE IF:  You are not able to take your medicines as prescribed.  Treatment is not helping your TUD and your symptoms get worse. SEEK IMMEDIATE  MEDICAL CARE IF:  You have serious thoughts about hurting yourself or others.  You have trouble breathing, chest pain, sudden weakness, or sudden numbness in part of your body.   This information is not intended to replace advice given to you by your health care provider. Make sure you discuss any questions you have with your health care provider.   Document Released: 10/22/2003 Document Revised: 03/08/2014 Document Reviewed: 04/13/2013 Elsevier Interactive Patient Education [employer]2016 Elsevier Inc.

## 2015-10-06 NOTE — Progress Notes (Signed)
Carol Gross is a 35 y.o. female presents to office today for annual physical exam examination.  Concerns today include:  1. Smoking cessation Reports that she has tried gum in the past, she was not successful.  Smoke 5 [place] daily x10 years.  Used to smoke 1/2 ppd x7-8 years.    Last eye exam: 1 year Last dental exam: today, needs tooth pulled Last pap smear: 11/2012 Immunizations needed: flu vaccines Refills needed today: cymbalta  Past Medical History:  Diagnosis Date  . Arthritis   . Back pain    sees ortho  . Carpal tunnel syndrome   . Chronic back pain   . Depression   . Fibromyalgia    ?  Marland Kitchen Headache(784.0)   . Hypertension    previously on. d/c'd by dr over month ago  . Shortness of breath    exertion   Social History   Social History  . Marital status: Single    Spouse name: N/A  . Number of children: N/A  . Years of education: N/A   Occupational History  . Not on file.   Social History Main Topics  . Smoking status: Current Every Day Smoker    Packs/day: 0.30    Years: 10.00    Types: Cigarettes  . Smokeless tobacco: Never Used     Comment: decreased smoking  . Alcohol use 0.0 oz/week     Comment: occasional/social  . Drug use:     Types: Marijuana     Comment: marijuana today 06/17/15  . Sexual activity: Yes    Birth control/ protection: Surgical   Other Topics Concern  . Not on file   Social History Narrative  . No narrative on file   Past Surgical History:  Procedure Laterality Date  . ABDOMINAL EXPOSURE N/A 06/25/2015   Procedure: ABDOMINAL EXPOSURE;  Surgeon: Rosetta Posner, MD;  Location: [facility];  Service: Vascular;  Laterality: N/A;  . ANTERIOR LUMBAR FUSION N/A 06/25/2015   Procedure: ANTERIOR LUMBAR INTERBODY  FUSION LUMBAR FIVE -SACRAL ONE;  Surgeon: Eustace Moore, MD;  Location: [facility];  Service: Neurosurgery;  Laterality: N/A;  . LUMBAR LAMINECTOMY/DECOMPRESSION MICRODISCECTOMY  03/03/2012   Procedure: LUMBAR  LAMINECTOMY/DECOMPRESSION MICRODISCECTOMY 1 LEVEL;  Surgeon: Floyce Stakes, MD;  Location: [facility];  Service: Neurosurgery;  Laterality: Left;  Left Lumbar five sacral one Diskectomy  . TUBAL LIGATION    . WISDOM TOOTH EXTRACTION     Family History  Problem Relation Age of Onset  . Depression Mother   . Coronary artery disease Mother   . Alcohol abuse Mother   . Hypertension Father   . Cancer Maternal Grandmother 32    breast  . Cancer Paternal Grandmother 67    lung  . Cancer Cousin 30    colon    ROS: Review of Systems Constitutional: negative Eyes: negative Ears, nose, mouth, throat, and face: negative Respiratory: negative Cardiovascular: negative Gastrointestinal: negative Genitourinary:positive for abnormal menstrual periods Integument/breast: negative Hematologic/lymphatic: negative Musculoskeletal:positive for back pain Neurological: negative Behavioral/Psych: positive for depression and tobacco use Endocrine: negative Allergic/Immunologic: negative   Physical exam BP 131/68   Pulse 92   Temp 98.3 F (36.8 C) (Oral)   Ht '5\' 7"'  (1.702 m)   Wt 227 lb 3.2 oz (103.1 kg)   LMP 10/06/2015   BMI 35.58 kg/m  General appearance: alert, cooperative, appears stated age and no distress Head: Normocephalic, without obvious abnormality, atraumatic Eyes: negative findings: lids and lashes  normal, conjunctivae and sclerae normal, corneas clear and pupils equal, round, reactive to light and accomodation Ears: normal TM's and external ear canals both ears Nose: Nares normal. Septum midline. Mucosa normal. No drainage or sinus tenderness. Throat: lips, mucosa, and tongue normal; teeth and gums normal Neck: no adenopathy, supple, symmetrical, trachea midline and thyroid not enlarged, symmetric, no tenderness/mass/nodules Back: symmetric, no curvature. ROM normal. No CVA tenderness. Lungs: intermittent expiratory wheeze, good airmovement Heart: regular rate and  rhythm, S1, S2 normal, no murmur, click, rub or gallop Abdomen: soft, non-tender; bowel sounds normal; no masses,  no organomegaly and obese Extremities:  WWP, no edema Pulses: 2+ and symmetric Skin: Skin color, texture, turgor normal. No rashes or lesions Lymph nodes: Cervical, supraclavicular, and axillary nodes normal. Neurologic: Alert and oriented X 3, normal strength and tone. Normal symmetric reflexes. Normal coordination and gait   Depression screen [facility] 2/9 10/06/2015 07/29/2015 06/11/2015  Decreased Interest 2 0 0  Down, Depressed, Hopeless 1 0 0  PHQ - 2 Score 3 0 0  Altered sleeping 1 - -  Tired, decreased energy 1 - -  Change in appetite 3 - -  Feeling bad or failure about yourself  1 - -  Trouble concentrating 2 - -  Moving slowly or fidgety/restless 0 - -  Suicidal thoughts 0 - -  PHQ-9 Score 11 - -  Difficult doing work/chores - - -    Assessment/ Plan: Carol Gross is a 35 y.o. female here for her annual exam  1. Well woman exam (no gynecological exam) - Patient asks for pap smear to be deferred, as she is on menstrual cycle - family h/o updated - Patient wanting counseling on mammograms.  No significant family h/o breast, ovarian, colon or uterine cancers.  Discussed that she is not a candidate for BRCA testing.  No red flags.  Discussed mammography to commence at 35 yo.  2. Obesity (BMI 30-39.9) - Encourage weight loss, lifestyle modification - Lipid panel - TSH - Comprehensive metabolic panel - buPROPion (WELLBUTRIN XL) 150 MG 24 hr tablet; Take 1 tablet (150 mg total) by mouth daily.  Dispense: 30 tablet; Refill: 2  3. Tobacco abuse.  Do not think that 51m nicotine patches would be helpful since she smokes <5 cigs/day.  Wellbutrin to assist with depression, smoking cessation and weight loss - Action phase - buPROPion (WELLBUTRIN XL) 150 MG 24 hr tablet; Take 1 tablet (150 mg total) by mouth daily.  Dispense: 30 tablet; Refill: 2  4. Major depressive  disorder, single episode, moderate (HBell. PHQ9 score 11. - No red flags, cymbalta helping  - DULoxetine (CYMBALTA) 30 MG capsule; Take 1 capsule (30 mg total) by mouth daily.  Dispense: 30 capsule; Refill: 5 - buPROPion (WELLBUTRIN XL) 150 MG 24 hr tablet; Take 1 tablet (150 mg total) by mouth daily.  Dispense: 30 tablet; Refill: 2  5. Menorrhagia with irregular cycle.  05/2015 u/s with endometrial thickness of 197m  No focal lesions/ fibroid identified. - Discussed using hormonal therapy to help with menstrual cycles.  Patient wishes for referral to gyn to explore options. - Ambulatory referral to Gynecology  Follow up in 3 months for smoking cessation / depression.  Need to make sure that pap smear is performed as well.   Toba Claudio M. GoLajuana RippleDO PGY-3, [facility]

## 2015-10-07 ENCOUNTER — Encounter: Payer: Self-pay | Admitting: Family Medicine

## 2015-10-07 LAB — TSH: TSH: 0.87 m[IU]/L

## 2015-10-07 LAB — COMPREHENSIVE METABOLIC PANEL
ALT: 10 U/L (ref 6–29)
AST: 14 U/L (ref 10–30)
Albumin: 4.2 g/dL (ref 3.6–5.1)
Alkaline Phosphatase: 65 U/L (ref 33–115)
BILIRUBIN TOTAL: 0.2 mg/dL (ref 0.2–1.2)
BUN: 7 mg/dL (ref 7–25)
CHLORIDE: 107 mmol/L (ref 98–110)
CO2: 22 mmol/L (ref 20–31)
CREATININE: 0.79 mg/dL (ref 0.50–1.10)
Calcium: 9.2 mg/dL (ref 8.6–10.2)
Glucose, Bld: 85 mg/dL (ref 65–99)
Potassium: 4.1 mmol/L (ref 3.5–5.3)
SODIUM: 139 mmol/L (ref 135–146)
TOTAL PROTEIN: 6.8 g/dL (ref 6.1–8.1)

## 2015-10-07 LAB — LIPID PANEL
CHOL/HDL RATIO: 2.9 ratio (ref <=5.0)
Cholesterol: 129 mg/dL (ref 125–200)
HDL: 45 mg/dL — ABNORMAL LOW (ref >=46)
LDL Cholesterol: 71 mg/dL (ref <130)
Triglycerides: 66 mg/dL (ref <150)
VLDL: 13 mg/dL (ref <30)

## 2015-11-18 ENCOUNTER — Encounter: Payer: Self-pay | Admitting: Obstetrics & Gynecology

## 2015-11-19 ENCOUNTER — Encounter: Payer: Self-pay | Admitting: Family Medicine

## 2015-11-19 NOTE — Progress Notes (Signed)
[address] [place] housing authority form filled out requesting first floor apartment or home without steps/ incline.  Placed for fax.  Ashly M. Nadine CountsGottschalk, DO PGY-3, [facility]

## 2015-11-27 ENCOUNTER — Telehealth: Payer: Self-pay

## 2015-11-27 NOTE — Telephone Encounter (Signed)
Pt is taking both medications.  States that she is just really going thru some stuff right now, did not want to elaborate.  She is not suicidal or homicidal.  Appt made for 12/05/15 (first available with PCP) but [facility], or call us if she feels she is getting worse before then. [place], Maryjo RochesterJessica Dawn, CMA

## 2015-11-27 NOTE — Telephone Encounter (Signed)
Patient should be on Wellbutrin AND Cymbalta.  If she is uncontrolled on both of these medications, she should schedule an appointment to see me and we will discuss her medications.

## 2015-11-27 NOTE — Telephone Encounter (Signed)
[facility] by [facility] and PHQ9 score was a 13. Wellbutrin that patient was prescribed not making much improvement. Please advise.

## 2015-12-03 ENCOUNTER — Other Ambulatory Visit: Payer: Self-pay | Admitting: Neurological Surgery

## 2015-12-03 DIAGNOSIS — M5416 Radiculopathy, lumbar region: Secondary | ICD-10-CM

## 2015-12-05 ENCOUNTER — Ambulatory Visit: Payer: Medicaid Other | Admitting: Family Medicine

## 2015-12-23 ENCOUNTER — Ambulatory Visit
Admission: RE | Admit: 2015-12-23 | Discharge: 2015-12-23 | Disposition: A | Discharge status: Home / Self Care | Payer: Medicaid Other | Source: Ambulatory Visit | Attending: Neurological Surgery | Admitting: Neurological Surgery

## 2015-12-23 ENCOUNTER — Telehealth: Payer: Self-pay | Admitting: Radiology

## 2015-12-23 VITALS — BP 127/68 | HR 57

## 2015-12-23 DIAGNOSIS — Z981 Arthrodesis status: Secondary | ICD-10-CM

## 2015-12-23 DIAGNOSIS — M5416 Radiculopathy, lumbar region: Secondary | ICD-10-CM

## 2015-12-23 MED ORDER — DIAZEPAM 5 MG PO TABS
10.0000 mg | ORAL_TABLET | Freq: Once | ORAL | Status: AC
  Administered 2015-12-23: 10 mg via ORAL

## 2015-12-23 MED ORDER — MEPERIDINE HCL 100 MG/ML IJ SOLN
75.0000 mg | Freq: Once | INTRAMUSCULAR | Status: AC
  Administered 2015-12-23: 75 mg via INTRAMUSCULAR

## 2015-12-23 MED ORDER — IOPAMIDOL (ISOVUE-M 200) INJECTION 41%
15.0000 mL | Freq: Once | INTRAMUSCULAR | Status: AC
  Administered 2015-12-23: 15 mL via INTRATHECAL

## 2015-12-23 MED ORDER — ONDANSETRON HCL 4 MG/2ML IJ SOLN
4.0000 mg | Freq: Once | INTRAMUSCULAR | Status: AC
  Administered 2015-12-23: 4 mg via INTRAMUSCULAR

## 2015-12-23 NOTE — Discharge Instructions (Signed)
Myelogram Discharge Instructions  1. Go home and rest quietly for the next 24 hours.  It is important to lie flat for the next 24 hours.  Get up only to go to the restroom.  You may lie in the bed or on a couch on your back, your stomach, your left side or your right side.  You may have one pillow under your head.  You may have pillows between your knees while you are on your side or under your knees while you are on your back.  2. DO NOT drive today.  Recline the seat as far back as it will go, while still wearing your seat belt, on the way home.  3. You may get up to go to the bathroom as needed.  You may sit up for 10 minutes to eat.  You may resume your normal diet and medications unless otherwise indicated.  Drink lots of extra fluids today and tomorrow.  4. The incidence of headache, nausea, or vomiting is about 5% (one in 20 patients).  If you develop a headache, lie flat and drink plenty of fluids until the headache goes away.  Caffeinated beverages may be helpful.  If you develop severe nausea and vomiting or a headache that does not go away with flat bed rest, call [phone].  5. You may resume normal activities after your 24 hours of bed rest is over; however, do not exert yourself strongly or do any heavy lifting tomorrow. If when you get up you have a headache when standing, go back to bed and force fluids for another 24 hours.  6. Call your physician for a follow-up appointment.  The results of your myelogram will be sent directly to your physician by the following day.  7. If you have any questions or if complications develop after you arrive home, please call [phone].  Discharge instructions have been explained to the patient.  The patient, or the person responsible for the patient, fully understands these instructions.        MAY RESUME WELLBUTRIN AND CYMBALTA ON OCT. 25, 2017, AFTER 9:30 AM.

## 2015-12-23 NOTE — Telephone Encounter (Signed)
Pt called because she said after lying for a while her toes and fingers felt numb and tingly. This went away when she got up and moved around. Explained this was not something that we usually see post myelogram and if it got worse or wouldn't go away she should call her attending physician.  Also told her if it did go away with movement it might be a positional thing from having to lay for long periods of time.

## 2015-12-25 ENCOUNTER — Telehealth: Payer: Self-pay

## 2015-12-25 NOTE — Telephone Encounter (Signed)
LMOM asking patient how she is doing after her myelogram here 12/23/15.  Carol Gross

## 2015-12-25 NOTE — Telephone Encounter (Signed)
Carol Gross returned my call from earlier and states she is doing okay after her myelogram here 12/23/15.  She states she had a headache yesterday, but it went away after she lied down for a while.  jkl

## 2015-12-26 ENCOUNTER — Telehealth: Payer: Self-pay | Admitting: Radiology

## 2015-12-26 ENCOUNTER — Encounter: Payer: Medicaid Other | Admitting: Obstetrics & Gynecology

## 2015-12-26 NOTE — Telephone Encounter (Signed)
Pt called to c/o headache starting yesterday. Asked her to go back to bed for another 24 hours and to drink lots of fluids, explained this should take of her headache. Told her to call if it didn't

## 2016-01-01 ENCOUNTER — Ambulatory Visit: Payer: Medicaid Other | Admitting: Family Medicine

## 2016-01-05 ENCOUNTER — Other Ambulatory Visit: Payer: Self-pay | Admitting: Family Medicine

## 2016-01-05 DIAGNOSIS — Z720 Tobacco use: Secondary | ICD-10-CM

## 2016-01-05 DIAGNOSIS — E669 Obesity, unspecified: Secondary | ICD-10-CM

## 2016-01-05 DIAGNOSIS — F321 Major depressive disorder, single episode, moderate: Secondary | ICD-10-CM

## 2016-01-09 ENCOUNTER — Encounter: Payer: Self-pay | Admitting: Family Medicine

## 2016-01-09 ENCOUNTER — Ambulatory Visit (INDEPENDENT_AMBULATORY_CARE_PROVIDER_SITE_OTHER): Payer: Medicaid Other | Admitting: Family Medicine

## 2016-01-09 ENCOUNTER — Encounter: Payer: Self-pay | Admitting: Psychology

## 2016-01-09 VITALS — BP 134/96 | HR 68 | Temp 98.6°F | Ht 67.0 in | Wt 227.0 lb

## 2016-01-09 DIAGNOSIS — F322 Major depressive disorder, single episode, severe without psychotic features: Secondary | ICD-10-CM

## 2016-01-09 DIAGNOSIS — F321 Major depressive disorder, single episode, moderate: Secondary | ICD-10-CM | POA: Insufficient documentation

## 2016-01-09 DIAGNOSIS — Z23 Encounter for immunization: Secondary | ICD-10-CM

## 2016-01-09 MED ORDER — DULOXETINE HCL 60 MG PO CPEP: 60.0000 mg | ORAL_CAPSULE | Freq: Every day | ORAL | 0 refills | Status: DC

## 2016-01-09 NOTE — Assessment & Plan Note (Signed)
PHQ9 score 19, GAD7 score15.  Dose of Cymbalta increased to 60mg  daily.  Patient to continue Wellbutrin. Will consider further increasing this dose to 300mg  daily if persistent symptoms.  She has chronic pain, which I hope that Cymbalta will also help with.  She appears to be under a lot of stress.  [facility]  She is interested in continuing conversations/ counseling.  Patient to follow up in next 4-6 weeks as her schedule allows.  She will call me in 2 weeks to let me know how the Cymbalta dose is doing.
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
## 2016-01-09 NOTE — Progress Notes (Signed)
    Subjective: CC: Depression follow up HPI: Carol Gross is a 35 y.o. female presenting to clinic today for follow up visit. Concerns today include:  1. Depression Patient currently on Cymbalta 30mg  and Wellbutrin 150mg  daily.  She reports that right now she is feeling ok.  She notes that 2 weeks ago she felt like a nervous breakdown.  She describes this a moment of profuse crying as a result of home stressors.  She denies SI/HI.  She is stressed out by her back pains as well.  She is open to talking to a counselor.  She reports poor sleep.  She has no plan for hurting herself but sometimes thinks her kids might be better off without her.  Social History Reviewed. FamHx and MedHx reviewed.  Please see EMR.  Health Maintenance: flu shot today  ROS: Per HPI  Objective: Office vital signs reviewed. BP (!) 134/96   Pulse 68   Temp 98.6 F (37 C) (Oral)   Ht 5\' 7"  (1.702 m)   Wt 227 lb (103 kg)   LMP 12/09/2015 (Approximate)   SpO2 98%   BMI 35.55 kg/m   Physical Examination:  General: Awake, alert, obese, No acute distress Psych: mood stable, speech normal, good eye contact, affect appropriate  GAD 7 : Generalized Anxiety Score 01/09/2016  Nervous, Anxious, on Edge 3  Control/stop worrying 3  Worry too much - different things 3  Trouble relaxing 2  Restless 0  Easily annoyed or irritable 2  Afraid - awful might happen 2  Total GAD 7 Score 15    Depression screen [facility] 01/09/2016 10/06/2015 07/29/2015  Decreased Interest 3 2 0  Down, Depressed, Hopeless 2 1 0  PHQ - 2 Score 5 3 0  Altered sleeping 3 1 -  Tired, decreased energy 3 1 -  Change in appetite 2 3 -  Feeling bad or failure about yourself  1 1 -  Trouble concentrating 2 2 -  Moving slowly or fidgety/restless 2 0 -  Suicidal thoughts 1 0 -  PHQ-9 Score 19 11 -  Difficult doing work/chores - - -    Assessment/ Plan: 35 y.o. female   Depression, major PHQ9 score 19, GAD7 score15.  Dose of Cymbalta  increased to 60mg  daily.  Patient to continue Wellbutrin. Will consider further increasing this dose to 300mg  daily if persistent symptoms.  She has chronic pain, which I hope that Cymbalta will also help with.  She appears to be under a lot of stress.  [facility].  She is interested in continuing conversations/ counseling.  Patient to follow up in next 4-6 weeks as her schedule allows.  She will call me in 2 weeks to let me know how the Cymbalta dose is doing.    Need for vaccination Flu shot administered today.  Raliegh IpAshly M Edwin Baines, DO PGY-3, [facility]

## 2016-01-30 ENCOUNTER — Telehealth: Payer: Self-pay | Admitting: Psychology

## 2016-01-30 NOTE — Telephone Encounter (Signed)
[facility] Loletta Specter(Vaibhav) called to check in with patient given patient's difficulty finding transportation to come to follow-up appointment. Patient reports taking Cymbalta and Wellbutrin as prescribed and feeling like "things are coming together". She says she is able to manage stress with her parents better and enjoy time with her kids. She will ask to see Behavioral Health whenever she next schedules an appointment with her PCP if needed. Patient appreciative of phone call.

## 2016-02-04 ENCOUNTER — Telehealth: Payer: Self-pay | Admitting: Family Medicine

## 2016-02-04 NOTE — Telephone Encounter (Signed)
Pt is calling because she needs a refill on her Cymbalta since the dosage has increase she has finished the bottle she already had. Please call in a refill with the new dosage. jw

## 2016-02-05 ENCOUNTER — Other Ambulatory Visit: Payer: Self-pay | Admitting: Family Medicine

## 2016-02-05 DIAGNOSIS — Z720 Tobacco use: Secondary | ICD-10-CM

## 2016-02-05 DIAGNOSIS — F321 Major depressive disorder, single episode, moderate: Secondary | ICD-10-CM

## 2016-02-05 DIAGNOSIS — E669 Obesity, unspecified: Secondary | ICD-10-CM

## 2016-02-25 ENCOUNTER — Ambulatory Visit: Payer: Medicaid Other | Admitting: Family Medicine

## 2016-03-23 ENCOUNTER — Other Ambulatory Visit: Payer: Self-pay | Admitting: Family Medicine

## 2016-03-23 DIAGNOSIS — F322 Major depressive disorder, single episode, severe without psychotic features: Secondary | ICD-10-CM

## 2016-04-02 ENCOUNTER — Other Ambulatory Visit: Payer: Self-pay | Admitting: Family Medicine

## 2016-04-05 ENCOUNTER — Other Ambulatory Visit (HOSPITAL_COMMUNITY)
Admission: RE | Admit: 2016-04-05 | Discharge: 2016-04-05 | Disposition: A | Discharge status: Home / Self Care | Payer: Medicaid Other | Source: Ambulatory Visit | Attending: Family Medicine | Admitting: Family Medicine

## 2016-04-05 ENCOUNTER — Encounter: Payer: Self-pay | Admitting: Family Medicine

## 2016-04-05 ENCOUNTER — Ambulatory Visit (INDEPENDENT_AMBULATORY_CARE_PROVIDER_SITE_OTHER): Payer: Medicaid Other | Admitting: Family Medicine

## 2016-04-05 VITALS — BP 118/84 | HR 70 | Temp 98.2°F | Ht 67.0 in | Wt 230.4 lb

## 2016-04-05 DIAGNOSIS — Z01411 Encounter for gynecological examination (general) (routine) with abnormal findings: Secondary | ICD-10-CM | POA: Insufficient documentation

## 2016-04-05 DIAGNOSIS — N760 Acute vaginitis: Secondary | ICD-10-CM | POA: Diagnosis not present

## 2016-04-05 DIAGNOSIS — B9689 Other specified bacterial agents as the cause of diseases classified elsewhere: Secondary | ICD-10-CM | POA: Diagnosis not present

## 2016-04-05 DIAGNOSIS — Z1151 Encounter for screening for human papillomavirus (HPV): Secondary | ICD-10-CM | POA: Insufficient documentation

## 2016-04-05 DIAGNOSIS — Z124 Encounter for screening for malignant neoplasm of cervix: Secondary | ICD-10-CM | POA: Diagnosis not present

## 2016-04-05 DIAGNOSIS — N938 Other specified abnormal uterine and vaginal bleeding: Secondary | ICD-10-CM

## 2016-04-05 DIAGNOSIS — F322 Major depressive disorder, single episode, severe without psychotic features: Secondary | ICD-10-CM | POA: Diagnosis not present

## 2016-04-05 DIAGNOSIS — Z113 Encounter for screening for infections with a predominantly sexual mode of transmission: Secondary | ICD-10-CM | POA: Insufficient documentation

## 2016-04-05 LAB — POCT URINE PREGNANCY: Preg Test, Ur: NEGATIVE

## 2016-04-05 LAB — POCT WET PREP (WET MOUNT)
CLUE CELLS WET PREP WHIFF POC: POSITIVE
Trichomonas Wet Prep HPF POC: ABSENT

## 2016-04-05 MED ORDER — METRONIDAZOLE 500 MG PO TABS: 500.0000 mg | ORAL_TABLET | Freq: Two times a day (BID) | ORAL | 0 refills | Status: DC

## 2016-04-05 NOTE — Progress Notes (Signed)
Subjective: CC: dysfunctional uterine bleeding ZOX:WRUEAVHPI:Carol Gross is a 36 y.o. female presenting to clinic today for same day appointment. PCP: Delynn FlavinAshly Gottschalk, DO Concerns today include:  1. Dysfunctional uterine bleeding She reports that last month she started her cycle around Dec 27-Jan 3, then came back on 13-23, then started spotting 29th then stopped 2/1.  She reports that this happened about 3 years ago.  She had an extensive workup at that time and was told everything was normal.  She was not placed on OCPs for bleeding.  She reports cramping, clots.  Denies abnormal vaginal discharge, pain with urination, fevers, chills, nausea, vomiting.  She is sexually active.  No dyspareunia.  Last pap 11/2012, negative.  Had u/s performed in 05/2015 that showed endometrial thickness of 12mm.  Allergies  Allergen Reactions  . Other Other (See Comments)    No blood jehovah's witness   Social Hx reviewed: active smoker. MedHx, current medications and allergies reviewed.  Please see EMR. ROS: Per HPI  Objective: Office vital signs reviewed. BP 118/84   Pulse 70   Temp 98.2 F (36.8 C) (Oral)   Ht 5\' 7"  (1.702 m)   Wt 230 lb 6.4 oz (104.5 kg)   SpO2 99%   BMI 36.09 kg/m   Physical Examination:  General: Awake, alert, obese, No acute distress Cardio: regular rate Pulm: normal work of breathing on room air GU: external vaginal tissue normal, cervix not well visualized due to habitus,  no bleeding, no abdominal/ adnexal masses Psych: good eye contact, speech normal.  Dep[facility] screen [facility] 04/05/2016 04/05/2016 01/09/2016  Decreased Interest 1 0 3  Down, Depressed, Hopeless 1 0 2  PHQ - 2 Score 2 0 5  Altered sleeping 2 - 3  Tired, decreased energy 2 - 3  Change in appetite 2 - 2  Feeling bad or failure about yourself  1 - 1  Trouble concentrating 0 - 2  Moving slowly or fidgety/restless 0 - 2  Suicidal thoughts 0 - 1  PHQ-9 Score 9 - 19  Difficult doing work/chores - - -    GAD 7 : Generalized Anxiety Score 04/05/2016 01/09/2016  Nervous, Anxious, on Edge 0 3  Control/stop worrying 1 3  Worry too much - different things 1 3  Trouble relaxing 1 2  Restless 2 0  Easily annoyed or irritable 0 2  Afraid - awful might happen 0 2  Total GAD 7 Score 5 15    Results for orders placed or performed in visit on 04/05/16 (from the past 24 hour(s))  POCT urine pregnancy     Status: None   Collection Time: 04/05/16  4:58 PM  Result Value Ref Range   Preg Test, Ur Negative Negative  POCT Wet Prep Mellody Drown(Wet [place])     Status: Abnormal   Collection Time: 04/05/16  5:51 PM  Result Value Ref Range   Source Wet Prep POC vaginal    WBC, Wet Prep HPF POC 0-5    Bacteria Wet Prep HPF POC Many (A) Few   Clue Cells Wet Prep HPF POC Moderate (A) None   Clue Cells Wet Prep Whiff POC Positive Whiff    Yeast Wet Prep HPF POC None    Trichomonas Wet Prep HPF POC Absent Absent    Assessment/ Plan: 36 y.o. female   Dysfunctional uterine bleeding.  05/2015 u/s reviewed.  Discussed various forms of hormonal medications.  Patient is an active smoker.  She would like to try Mirena IUD.   -  Weight loss encouraged - Smoking cessation encouraged - POCT urine pregnancy - POCT Wet Prep [facility]) - Cervicovaginal ancillary only - Cytology - PAP [place]  Screening for cervical cancer - Cytology - PAP [place] w/ HPV GC/CT testing  Bacterial vaginosis. Odor and clue cells on wet prep. - metroNIDAZOLE (FLAGYL) 500 MG tablet; Take 1 tablet (500 mg total) by mouth 2 (two) times daily.  Dispense: 14 tablet; Refill: 0  Depression, major Both GAD7 and PHQ9 scores have improved compared to previous.  Doing well on Cymbalta 60mg  and Wellbutrin.  Continue meds.  Appt scheduled for colpo clinic for IUD insertion 04/29/16 at 1030am  Raliegh Ip, DO PGY-3, [facility]

## 2016-04-05 NOTE — Patient Instructions (Signed)
I will contact you will the results of your labs.  If anything is abnormal, I will call you.  Otherwise, expect a copy to be mailed to you.  Plan to follow up on March 1 at 10:00 for IUD placement.  You will plan to see me about 2 weeks after it is placed and we will check in on your periods/ string check   Intrauterine Device Information Introduction An intrauterine device (IUD) is a medical device that gets inserted into the uterus to prevent pregnancy. It is a small, T-shaped device that has one or two nylon strings hanging down from it. The strings hang out of the lower part of the uterus (cervix) to allow for future IUD removal. There are two types of IUDs available:  Copper IUD. This type of IUD has copper wire wrapped around it. A copper IUD may last up to 10 years.  Hormone IUD. This type of IUD is made of plastic and contains the hormone progestin (synthetic progesterone). A hormone IUD may last 3 to 5 years. IUDs are inserted through the vagina and placed into the uterus with a minor medical procedure. How does the IUD work? Copper in the copper IUD prevents pregnancy by making the uterus and fallopian tubes produce a fluid that kills sperm. Synthetic progesterone in hormonal IUD prevents pregnancy by:  Thickening cervical mucus to prevent sperm from entering the uterus.  Thinning the uterine lining to prevent implantation of a fertilized egg.  Weakening or killing sperm that get into the uterus. What are the advantages of an IUD?  IUDs are highly effective, reversible, long-acting, and low-maintenance.  There are no estrogen-related side effects.  An IUD can be used when breastfeeding.  IUDs are not associated with weight gain.  Advantages of the copper IUD are that:  It works immediately after insertion.  It does not interfere with your body's natural hormones.  It can be used for 10 years.  Advantages of the hormone IUD are that:  If it is inserted within 7 days  of your period starting, it works immediately after insertion. If the hormone IUD is inserted at any other time in your cycle, you will need to use a backup method of birth control for 7 days after insertion.  It can make menstrual periods lighter.  It can decrease menstrual cramping.  It can be used for 3 or 5 years. What are the disadvantages of an IUD?  The hormone IUD may cause irregular menstrual bleeding for a period of time after insertion.  The copper IUD can make your menstrual flow heavier and more painful.  You may experience some pain during insertion, and cramping and vaginal bleeding after insertion. How is the IUD removed? Is the IUD right for me?  Your health care provider will make sure you are a good candidate for an IUD and will discuss side effects with you. This information is not intended to replace advice given to you by your health care provider. Make sure you discuss any questions you have with your health care provider. Document Released: 01/20/2004 Document Revised: 07/24/2015 Document Reviewed: 08/06/2012  2017 Elsevier

## 2016-04-05 NOTE — Assessment & Plan Note (Signed)
Both GAD7 and PHQ9 scores have improved compared to previous.  Doing well on Cymbalta 60mg  and Wellbutrin.  Continue meds.

## 2016-04-07 LAB — CERVICOVAGINAL ANCILLARY ONLY
CHLAMYDIA, DNA PROBE: NEGATIVE
Neisseria Gonorrhea: NEGATIVE
TRICH (WINDOWPATH): NEGATIVE

## 2016-04-08 LAB — CYTOLOGY - PAP
ADEQUACY: ABSENT — AB
Diagnosis: UNDETERMINED — AB
HPV: NOT DETECTED

## 2016-04-09 ENCOUNTER — Encounter: Payer: Self-pay | Admitting: Family Medicine

## 2016-04-29 ENCOUNTER — Ambulatory Visit: Payer: Medicaid Other

## 2016-05-04 ENCOUNTER — Other Ambulatory Visit: Payer: Self-pay | Admitting: Family Medicine

## 2016-05-04 DIAGNOSIS — F322 Major depressive disorder, single episode, severe without psychotic features: Secondary | ICD-10-CM

## 2016-05-13 ENCOUNTER — Other Ambulatory Visit: Payer: Self-pay | Admitting: Neurological Surgery

## 2016-05-13 DIAGNOSIS — S32009K Unspecified fracture of unspecified lumbar vertebra, subsequent encounter for fracture with nonunion: Secondary | ICD-10-CM

## 2016-05-17 ENCOUNTER — Ambulatory Visit
Admission: RE | Admit: 2016-05-17 | Discharge: 2016-05-17 | Disposition: A | Discharge status: Home / Self Care | Payer: Medicaid Other | Source: Ambulatory Visit | Attending: Neurological Surgery | Admitting: Neurological Surgery

## 2016-05-17 DIAGNOSIS — S32009K Unspecified fracture of unspecified lumbar vertebra, subsequent encounter for fracture with nonunion: Secondary | ICD-10-CM

## 2016-07-23 ENCOUNTER — Other Ambulatory Visit: Payer: Self-pay | Admitting: Neurological Surgery

## 2016-08-10 NOTE — Progress Notes (Deleted)
    Subjective: CC: irregular menstrual cycle HPI: Carol Gross is a [age] y.o. female presenting to clinic today for:  Patient reports that she has had irregular menstrual cycles for ***.  She reports No LMP recorded.Marland Kitchen.  She was seen in 04/2016 when she noted that the month prior, she started her cycle around Dec 27-Jan 3, then came back on 13-23, then started spotting 29th then stopped 2/1.  She reports that this happened about 3 years ago.  She had an extensive workup at that time and was told everything was normal.  She was not placed on OCPs for bleeding.  She reports cramping, clots.***  Denies abnormal vaginal discharge, pain with urination, fevers, chills, nausea, vomiting.  She is sexually active.  No dyspareunia.  Last pap 11/2012, negative.  Had u/s performed in 05/2015 that showed endometrial thickness of 12mm.  She was scheduled for 3/1 for IUD insertion.  She did not go to this appt ***   Social Hx reviewed: *** smoker. MedHx, medications and allergies reviewed.  Please see EMR. ROS: Per HPI  Objective: Office vital signs reviewed. There were no vitals taken for this visit.  Physical Examination:  General: Awake, alert, *** nourished, No acute distress HEENT: Normal    Neck: No masses palpated. No lymphadenopathy    Ears: Tympanic membranes intact, normal light reflex, no erythema, no bulging    Eyes: PERRLA, EOMI, sclera ***    Nose: nasal turbinates moist, *** nasal discharge    Throat: moist mucus membranes, no erythema, *** tonsillar exudate.  Airway is patent Cardio: regular rate and rhythm, S1S2 heard, no murmurs appreciated Pulm: clear to auscultation bilaterally, no wheezes, rhonchi or rales; normal work of breathing on room air GI: soft, non-tender, non-distended, bowel sounds present x4, no hepatomegaly, no splenomegaly, no masses GU: external vaginal tissue ***, cervix ***, *** punctate lesions on cervix appreciated, *** discharge from cervical os, *** bleeding, ***  cervical motion tenderness, *** abdominal/ adnexal masses Extremities: warm, well perfused, No edema, cyanosis or clubbing; +*** pulses bilaterally MSK: *** gait and *** station Skin: dry; intact; no rashes or lesions Neuro: *** Strength and light touch sensation grossly intact, *** DTRs ***/4  Assessment/ Plan: 36 y.o. female   No problem-specific Assessment & Plan notes found for this encounter.   Raliegh IpAshly M Courvoisier Hamblen, DO PGY-3, [facility]

## 2016-08-13 ENCOUNTER — Ambulatory Visit: Payer: Medicaid Other | Admitting: Family Medicine

## 2016-09-08 ENCOUNTER — Ambulatory Visit: Payer: Medicaid Other | Admitting: Family Medicine

## 2016-09-22 ENCOUNTER — Other Ambulatory Visit: Payer: Self-pay | Admitting: Neurological Surgery

## 2016-09-29 ENCOUNTER — Inpatient Hospital Stay: Admit: 2016-09-29 | Payer: Medicaid Other | Admitting: Neurological Surgery

## 2016-09-29 SURGERY — LAMINECTOMY WITH POSTERIOR LATERAL ARTHRODESIS LEVEL 1
Anesthesia: General | Site: Back

## 2016-10-06 ENCOUNTER — Ambulatory Visit (INDEPENDENT_AMBULATORY_CARE_PROVIDER_SITE_OTHER): Payer: Medicaid Other | Admitting: Family Medicine

## 2016-10-06 ENCOUNTER — Encounter: Payer: Self-pay | Admitting: Family Medicine

## 2016-10-06 DIAGNOSIS — G8929 Other chronic pain: Secondary | ICD-10-CM | POA: Diagnosis not present

## 2016-10-06 DIAGNOSIS — M545 Low back pain: Secondary | ICD-10-CM

## 2016-10-06 DIAGNOSIS — Z720 Tobacco use: Secondary | ICD-10-CM | POA: Diagnosis not present

## 2016-10-06 DIAGNOSIS — F1410 Cocaine abuse, uncomplicated: Secondary | ICD-10-CM | POA: Diagnosis not present
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
## 2016-10-06 NOTE — Patient Instructions (Signed)
It was a pleasure to see you today! Thank you for choosing [facility] for your primary care. Carol Gross was seen for physical, weight loss.   Our plans for today were:  We don't prescribe weight loss medicine here. After your surgery, if you want to be referred to the weight management office, we can do that.   Try to exercise as much as you can before your surgery - I recommend water exercise. Your neurosurgeon will have to clear you for water exercise again after your surgery.   Try to keep decreasing your smoking before your surgery, and maybe you can quit after that hospitalization!   Starchy (carb) foods: Bread, rice, pasta, potatoes, corn, cereal, grits, crackers, bagels, muffins, all baked goods.  (Fruits, milk, and yogurt also have carbohydrate, but most of these foods will not spike your blood sugar as most starchy foods will.)  A few fruits do cause high blood sugars; use small portions of bananas (limit to 1/2 at a time), grapes, watermelon, oranges, and most tropical fruits.    Protein foods: Meat, fish, poultry, eggs, dairy foods, and beans such as pinto and kidney beans (beans also provide carbohydrate).   1. Eat at least 3 meals and 1-2 snacks per day. Never go more than 4-5 hours while awake without eating. Eat breakfast within the first hour of getting up.   2. Limit starchy foods to TWO per meal and ONE per snack. ONE portion of a starchy  food is equal to the following:   - ONE slice of bread (or its equivalent, such as half of a hamburger bun).   - 1/2 cup of a "scoopable" starchy food such as potatoes or rice.   - 15 grams of Total Carbohydrate as shown on food label.  3. Include at every meal: a protein food, a carb food, and vegetables and/or fruit.   - Obtain twice the volume of veg's as protein or carbohydrate foods for both lunch and dinner.   - Fresh or frozen veg's are best.   - Keep frozen veg's on hand for a quick vegetable serving.     Best,   Dr. Chanetta Marshallimberlake

## 2016-10-07 NOTE — Assessment & Plan Note (Signed)
Seeing neurosurgery for surgery scheduled for the first week of September. She would like to exercise, but is curious about restrictions after surgery. Recommended that she ask her surgeon after the surgery, but try to exercise prior to this.

## 2016-10-07 NOTE — Assessment & Plan Note (Signed)
Has decreased from 1ppd in the past to now 4 cigarettes per day. We discussed continued weaning from cigarettes over the next month prior to her surgery, and I recommended that she make her quit date her surgery admission. Recommended that she ask for nicotine patches during that admission. Continue to discuss this.

## 2016-10-07 NOTE — Assessment & Plan Note (Signed)
Last use reported 1 month ago. Counseled patient that is danger for her heart and her long term health. She states she wants to be abstinent from this, encouraged her.

## 2016-10-07 NOTE — Assessment & Plan Note (Signed)
BMI currently 35. Patient reports she often gains weight after her surgery. Recommended light walking in the pool to alleviate back pain during exercise. Suggested that she look into [employer] discounts based on income. Encouraged her to drink water rather than soda and follow a higher vegetable and lower starch diet. Patient is not a candidate for fenteramine in my opinion given concomitant cocaine use.

## 2016-10-29 ENCOUNTER — Inpatient Hospital Stay (HOSPITAL_COMMUNITY): Admission: RE | Admit: 2016-10-29 | Payer: Medicaid Other | Source: Ambulatory Visit

## 2016-11-03 ENCOUNTER — Inpatient Hospital Stay (HOSPITAL_COMMUNITY): Payer: Medicaid Other

## 2016-11-03 ENCOUNTER — Encounter (HOSPITAL_COMMUNITY)
Admission: RE | Disposition: A | Discharge status: Home / Self Care | Payer: Self-pay | Source: Home / Self Care | Attending: Neurological Surgery

## 2016-11-03 ENCOUNTER — Encounter (HOSPITAL_COMMUNITY): Payer: Self-pay | Admitting: Certified Registered Nurse Anesthetist

## 2016-11-03 ENCOUNTER — Inpatient Hospital Stay (HOSPITAL_COMMUNITY)
Admission: RE | Admit: 2016-11-03 | Discharge: 2016-11-04 | DRG: 460 | Disposition: A | Discharge status: Home / Self Care | Payer: Medicaid Other | Attending: Neurological Surgery | Admitting: Neurological Surgery

## 2016-11-03 ENCOUNTER — Inpatient Hospital Stay (HOSPITAL_COMMUNITY): Payer: Medicaid Other | Admitting: Anesthesiology

## 2016-11-03 ENCOUNTER — Telehealth: Payer: Self-pay | Admitting: Family Medicine

## 2016-11-03 DIAGNOSIS — Z981 Arthrodesis status: Secondary | ICD-10-CM

## 2016-11-03 DIAGNOSIS — Z8249 Family history of ischemic heart disease and other diseases of the circulatory system: Secondary | ICD-10-CM | POA: Diagnosis not present

## 2016-11-03 DIAGNOSIS — G8929 Other chronic pain: Secondary | ICD-10-CM

## 2016-11-03 DIAGNOSIS — Z818 Family history of other mental and behavioral disorders: Secondary | ICD-10-CM

## 2016-11-03 DIAGNOSIS — Y838 Other surgical procedures as the cause of abnormal reaction of the patient, or of later complication, without mention of misadventure at the time of the procedure: Secondary | ICD-10-CM | POA: Diagnosis present

## 2016-11-03 DIAGNOSIS — G5600 Carpal tunnel syndrome, unspecified upper limb: Secondary | ICD-10-CM | POA: Diagnosis present

## 2016-11-03 DIAGNOSIS — M545 Low back pain: Secondary | ICD-10-CM | POA: Diagnosis present

## 2016-11-03 DIAGNOSIS — F329 Major depressive disorder, single episode, unspecified: Secondary | ICD-10-CM | POA: Diagnosis present

## 2016-11-03 DIAGNOSIS — F17210 Nicotine dependence, cigarettes, uncomplicated: Secondary | ICD-10-CM | POA: Diagnosis present

## 2016-11-03 DIAGNOSIS — I10 Essential (primary) hypertension: Secondary | ICD-10-CM | POA: Diagnosis present

## 2016-11-03 DIAGNOSIS — Z419 Encounter for procedure for purposes other than remedying health state, unspecified: Secondary | ICD-10-CM

## 2016-11-03 DIAGNOSIS — M960 Pseudarthrosis after fusion or arthrodesis: Principal | ICD-10-CM | POA: Diagnosis present

## 2016-11-03 DIAGNOSIS — M5442 Lumbago with sciatica, left side: Principal | ICD-10-CM

## 2016-11-03 DIAGNOSIS — Z6836 Body mass index (BMI) 36.0-36.9, adult: Secondary | ICD-10-CM

## 2016-11-03 DIAGNOSIS — M5441 Lumbago with sciatica, right side: Principal | ICD-10-CM

## 2016-11-03 DIAGNOSIS — M797 Fibromyalgia: Secondary | ICD-10-CM | POA: Diagnosis present

## 2016-11-03 DIAGNOSIS — S32009K Unspecified fracture of unspecified lumbar vertebra, subsequent encounter for fracture with nonunion: Secondary | ICD-10-CM

## 2016-11-03 HISTORY — PX: LAMINECTOMY WITH POSTERIOR LATERAL ARTHRODESIS LEVEL 1: SHX6335

## 2016-11-03 LAB — CBC WITH DIFFERENTIAL/PLATELET
BASOS ABS: 0 10*3/uL (ref 0.0–0.1)
BASOS PCT: 0 %
Eosinophils Absolute: 0.1 10*3/uL (ref 0.0–0.7)
Eosinophils Relative: 1 %
HEMATOCRIT: 34.5 % — AB (ref 36.0–46.0)
Hemoglobin: 10.7 g/dL — ABNORMAL LOW (ref 12.0–15.0)
Lymphocytes Relative: 33 %
Lymphs Abs: 2.1 10*3/uL (ref 0.7–4.0)
MCH: 24.7 pg — ABNORMAL LOW (ref 26.0–34.0)
MCHC: 31 g/dL (ref 30.0–36.0)
MCV: 79.5 fL (ref 78.0–100.0)
MONO ABS: 0.4 10*3/uL (ref 0.1–1.0)
Monocytes Relative: 6 %
NEUTROS ABS: 3.9 10*3/uL (ref 1.7–7.7)
Neutrophils Relative %: 60 %
PLATELETS: 271 10*3/uL (ref 150–400)
RBC: 4.34 MIL/uL (ref 3.87–5.11)
RDW: 16.8 % — AB (ref 11.5–15.5)
WBC: 6.4 10*3/uL (ref 4.0–10.5)

## 2016-11-03 LAB — SURGICAL PCR SCREEN
MRSA, PCR: NEGATIVE
Staphylococcus aureus: POSITIVE — AB

## 2016-11-03 LAB — BASIC METABOLIC PANEL
ANION GAP: 7 (ref 5–15)
BUN: 7 mg/dL (ref 6–20)
CALCIUM: 8.7 mg/dL — AB (ref 8.9–10.3)
CO2: 24 mmol/L (ref 22–32)
Chloride: 105 mmol/L (ref 101–111)
Creatinine, Ser: 0.9 mg/dL (ref 0.44–1.00)
GLUCOSE: 99 mg/dL (ref 65–99)
Potassium: 3.5 mmol/L (ref 3.5–5.1)
SODIUM: 136 mmol/L (ref 135–145)

## 2016-11-03 LAB — HCG, SERUM, QUALITATIVE: PREG SERUM: NEGATIVE

## 2016-11-03 LAB — PROTIME-INR
INR: 0.99
Prothrombin Time: 13 seconds (ref 11.4–15.2)

## 2016-11-03 LAB — NO BLOOD PRODUCTS

## 2016-11-03 SURGERY — LAMINECTOMY WITH POSTERIOR LATERAL ARTHRODESIS LEVEL 1
Anesthesia: General | Site: Back

## 2016-11-03 MED ORDER — BACITRACIN 50000 UNITS IM SOLR
INTRAMUSCULAR | Status: DC | PRN
  Administered 2016-11-03: 500 mL

## 2016-11-03 MED ORDER — ONDANSETRON HCL 4 MG/2ML IJ SOLN
INTRAMUSCULAR | Status: DC | PRN
  Administered 2016-11-03: 4 mg via INTRAVENOUS

## 2016-11-03 MED ORDER — THROMBIN 20000 UNITS EX SOLR
CUTANEOUS | Status: DC | PRN
  Administered 2016-11-03: 20 mL via TOPICAL

## 2016-11-03 MED ORDER — HYDROCODONE-ACETAMINOPHEN 5-325 MG PO TABS
1.0000 | ORAL_TABLET | ORAL | Status: DC | PRN
  Administered 2016-11-03 – 2016-11-04 (×6): 2 via ORAL
  Filled 2016-11-03 (×5): qty 2

## 2016-11-03 MED ORDER — CEFAZOLIN SODIUM-DEXTROSE 2-4 GM/100ML-% IV SOLN
2.0000 g | INTRAVENOUS | Status: AC
  Administered 2016-11-03: 2 g via INTRAVENOUS
  Filled 2016-11-03: qty 100

## 2016-11-03 MED ORDER — MUPIROCIN 2 % EX OINT
TOPICAL_OINTMENT | Freq: Once | CUTANEOUS | Status: AC
Start: 2016-11-03 — End: 2016-11-03
  Administered 2016-11-03: 1 via NASAL
  Filled 2016-11-03: qty 22

## 2016-11-03 MED ORDER — DEXAMETHASONE SODIUM PHOSPHATE 4 MG/ML IJ SOLN: 4.0000 mg | Freq: Four times a day (QID) | INTRAMUSCULAR | Status: DC

## 2016-11-03 MED ORDER — ROCURONIUM BROMIDE 10 MG/ML (PF) SYRINGE
PREFILLED_SYRINGE | INTRAVENOUS | Status: DC | PRN
  Administered 2016-11-03: 50 mg via INTRAVENOUS

## 2016-11-03 MED ORDER — FENTANYL CITRATE (PF) 250 MCG/5ML IJ SOLN
INTRAMUSCULAR | Status: AC
  Filled 2016-11-03: qty 5

## 2016-11-03 MED ORDER — LIDOCAINE 2% (20 MG/ML) 5 ML SYRINGE
INTRAMUSCULAR | Status: AC
  Filled 2016-11-03: qty 5

## 2016-11-03 MED ORDER — 0.9 % SODIUM CHLORIDE (POUR BTL) OPTIME
TOPICAL | Status: DC | PRN
  Administered 2016-11-03: 1000 mL

## 2016-11-03 MED ORDER — HYDROMORPHONE HCL 1 MG/ML IJ SOLN
INTRAMUSCULAR | Status: AC
  Administered 2016-11-03: 0.5 mg via INTRAVENOUS
  Filled 2016-11-03: qty 1

## 2016-11-03 MED ORDER — POTASSIUM CHLORIDE IN NACL 20-0.9 MEQ/L-% IV SOLN: INTRAVENOUS | Status: DC

## 2016-11-03 MED ORDER — ROCURONIUM BROMIDE 10 MG/ML (PF) SYRINGE
PREFILLED_SYRINGE | INTRAVENOUS | Status: AC
  Filled 2016-11-03: qty 5

## 2016-11-03 MED ORDER — PHENYLEPHRINE 40 MCG/ML (10ML) SYRINGE FOR IV PUSH (FOR BLOOD PRESSURE SUPPORT)
PREFILLED_SYRINGE | INTRAVENOUS | Status: DC | PRN
  Administered 2016-11-03 (×2): 40 ug via INTRAVENOUS

## 2016-11-03 MED ORDER — HYDROCODONE-ACETAMINOPHEN 5-325 MG PO TABS
ORAL_TABLET | ORAL | Status: AC
  Filled 2016-11-03: qty 2

## 2016-11-03 MED ORDER — THROMBIN 5000 UNITS EX SOLR
CUTANEOUS | Status: AC
  Filled 2016-11-03: qty 5000

## 2016-11-03 MED ORDER — SUGAMMADEX SODIUM 200 MG/2ML IV SOLN
INTRAVENOUS | Status: AC
  Filled 2016-11-03: qty 2

## 2016-11-03 MED ORDER — SENNA 8.6 MG PO TABS
1.0000 | ORAL_TABLET | Freq: Two times a day (BID) | ORAL | Status: DC
  Administered 2016-11-03 – 2016-11-04 (×3): 8.6 mg via ORAL
  Filled 2016-11-03 (×3): qty 1

## 2016-11-03 MED ORDER — ACETAMINOPHEN 650 MG RE SUPP: 650.0000 mg | RECTAL | Status: DC | PRN

## 2016-11-03 MED ORDER — ONDANSETRON HCL 4 MG PO TABS: 4.0000 mg | ORAL_TABLET | Freq: Four times a day (QID) | ORAL | Status: DC | PRN

## 2016-11-03 MED ORDER — MUPIROCIN 2 % EX OINT
TOPICAL_OINTMENT | Freq: Two times a day (BID) | CUTANEOUS | Status: DC
  Administered 2016-11-03 – 2016-11-04 (×2): via NASAL

## 2016-11-03 MED ORDER — CEFAZOLIN SODIUM-DEXTROSE 2-4 GM/100ML-% IV SOLN
INTRAVENOUS | Status: AC
  Filled 2016-11-03: qty 100

## 2016-11-03 MED ORDER — VANCOMYCIN HCL 1000 MG IV SOLR
INTRAVENOUS | Status: DC | PRN
  Administered 2016-11-03: 1000 mg via TOPICAL

## 2016-11-03 MED ORDER — CEFAZOLIN SODIUM-DEXTROSE 2-4 GM/100ML-% IV SOLN
2.0000 g | Freq: Three times a day (TID) | INTRAVENOUS | Status: AC
  Administered 2016-11-03 – 2016-11-04 (×2): 2 g via INTRAVENOUS
  Filled 2016-11-03 (×2): qty 100

## 2016-11-03 MED ORDER — PROPOFOL 10 MG/ML IV BOLUS
INTRAVENOUS | Status: AC
Start: 2016-11-03 — End: 2016-11-03
  Filled 2016-11-03: qty 20

## 2016-11-03 MED ORDER — BUPIVACAINE HCL (PF) 0.5 % IJ SOLN
INTRAMUSCULAR | Status: AC
  Filled 2016-11-03: qty 30

## 2016-11-03 MED ORDER — PHENOL 1.4 % MT LIQD: 1.0000 | OROMUCOSAL | Status: DC | PRN

## 2016-11-03 MED ORDER — METHOCARBAMOL 500 MG PO TABS
ORAL_TABLET | ORAL | Status: AC
  Filled 2016-11-03: qty 1

## 2016-11-03 MED ORDER — PHENYLEPHRINE 40 MCG/ML (10ML) SYRINGE FOR IV PUSH (FOR BLOOD PRESSURE SUPPORT)
PREFILLED_SYRINGE | INTRAVENOUS | Status: AC
  Filled 2016-11-03: qty 10

## 2016-11-03 MED ORDER — DEXAMETHASONE SODIUM PHOSPHATE 10 MG/ML IJ SOLN
INTRAMUSCULAR | Status: AC
  Filled 2016-11-03: qty 1

## 2016-11-03 MED ORDER — CELECOXIB 200 MG PO CAPS
200.0000 mg | ORAL_CAPSULE | Freq: Two times a day (BID) | ORAL | Status: DC
  Administered 2016-11-03 – 2016-11-04 (×3): 200 mg via ORAL
  Filled 2016-11-03 (×3): qty 1

## 2016-11-03 MED ORDER — VANCOMYCIN HCL 1000 MG IV SOLR
INTRAVENOUS | Status: AC
  Filled 2016-11-03: qty 1000

## 2016-11-03 MED ORDER — ACETAMINOPHEN 325 MG PO TABS: 650.0000 mg | ORAL_TABLET | ORAL | Status: DC | PRN

## 2016-11-03 MED ORDER — ONDANSETRON HCL 4 MG/2ML IJ SOLN
INTRAMUSCULAR | Status: AC
  Filled 2016-11-03: qty 2

## 2016-11-03 MED ORDER — CHLORHEXIDINE GLUCONATE CLOTH 2 % EX PADS: 6.0000 | MEDICATED_PAD | Freq: Once | CUTANEOUS | Status: DC

## 2016-11-03 MED ORDER — SODIUM CHLORIDE 0.9% FLUSH: 3.0000 mL | INTRAVENOUS | Status: DC | PRN

## 2016-11-03 MED ORDER — DEXAMETHASONE SODIUM PHOSPHATE 10 MG/ML IJ SOLN
10.0000 mg | INTRAMUSCULAR | Status: AC
  Administered 2016-11-03: 10 mg via INTRAVENOUS
  Filled 2016-11-03: qty 1

## 2016-11-03 MED ORDER — LIDOCAINE 2% (20 MG/ML) 5 ML SYRINGE
INTRAMUSCULAR | Status: DC | PRN
  Administered 2016-11-03: 100 mg via INTRAVENOUS

## 2016-11-03 MED ORDER — FENTANYL CITRATE (PF) 100 MCG/2ML IJ SOLN
INTRAMUSCULAR | Status: DC | PRN
  Administered 2016-11-03: 50 ug via INTRAVENOUS
  Administered 2016-11-03: 25 ug via INTRAVENOUS
  Administered 2016-11-03 (×2): 75 ug via INTRAVENOUS
  Administered 2016-11-03: 25 ug via INTRAVENOUS

## 2016-11-03 MED ORDER — ONDANSETRON HCL 4 MG/2ML IJ SOLN: 4.0000 mg | Freq: Four times a day (QID) | INTRAMUSCULAR | Status: DC | PRN

## 2016-11-03 MED ORDER — BUPIVACAINE HCL (PF) 0.25 % IJ SOLN
INTRAMUSCULAR | Status: DC | PRN
  Administered 2016-11-03: 8 mL

## 2016-11-03 MED ORDER — THROMBIN 20000 UNITS EX SOLR
CUTANEOUS | Status: AC
Start: 2016-11-03 — End: 2016-11-03
  Filled 2016-11-03: qty 20000

## 2016-11-03 MED ORDER — LACTATED RINGERS IV SOLN
INTRAVENOUS | Status: DC | PRN
  Administered 2016-11-03 (×2): via INTRAVENOUS

## 2016-11-03 MED ORDER — HYDROMORPHONE HCL 1 MG/ML IJ SOLN
0.2500 mg | INTRAMUSCULAR | Status: DC | PRN
  Administered 2016-11-03: 0.5 mg via INTRAVENOUS

## 2016-11-03 MED ORDER — DEXAMETHASONE 4 MG PO TABS
4.0000 mg | ORAL_TABLET | Freq: Four times a day (QID) | ORAL | Status: DC
  Administered 2016-11-03 – 2016-11-04 (×4): 4 mg via ORAL
  Filled 2016-11-03 (×4): qty 1

## 2016-11-03 MED ORDER — MENTHOL 3 MG MT LOZG: 1.0000 | LOZENGE | OROMUCOSAL | Status: DC | PRN

## 2016-11-03 MED ORDER — SODIUM CHLORIDE 0.9% FLUSH
3.0000 mL | Freq: Two times a day (BID) | INTRAVENOUS | Status: DC
  Administered 2016-11-03 (×2): 3 mL via INTRAVENOUS

## 2016-11-03 MED ORDER — PROPOFOL 10 MG/ML IV BOLUS
INTRAVENOUS | Status: DC | PRN
  Administered 2016-11-03: 150 mg via INTRAVENOUS
  Administered 2016-11-03: 50 mg via INTRAVENOUS

## 2016-11-03 MED ORDER — MORPHINE SULFATE (PF) 2 MG/ML IV SOLN
2.0000 mg | INTRAVENOUS | Status: DC | PRN
Start: 2016-11-03 — End: 2016-11-03

## 2016-11-03 MED ORDER — THROMBIN 5000 UNITS EX SOLR
OROMUCOSAL | Status: DC | PRN
  Administered 2016-11-03: 5 mL via TOPICAL

## 2016-11-03 MED ORDER — MORPHINE SULFATE (PF) 4 MG/ML IV SOLN
2.0000 mg | INTRAVENOUS | Status: DC | PRN
  Administered 2016-11-03: 2 mg via INTRAVENOUS
  Filled 2016-11-03: qty 1

## 2016-11-03 MED ORDER — MIDAZOLAM HCL 5 MG/5ML IJ SOLN
INTRAMUSCULAR | Status: DC | PRN
  Administered 2016-11-03: 2 mg via INTRAVENOUS

## 2016-11-03 MED ORDER — MIDAZOLAM HCL 2 MG/2ML IJ SOLN
INTRAMUSCULAR | Status: AC
Start: 2016-11-03 — End: 2016-11-03
  Filled 2016-11-03: qty 2

## 2016-11-03 MED ORDER — PROMETHAZINE HCL 25 MG/ML IJ SOLN: 6.2500 mg | INTRAMUSCULAR | Status: DC | PRN

## 2016-11-03 MED ORDER — METHOCARBAMOL 500 MG PO TABS
500.0000 mg | ORAL_TABLET | Freq: Four times a day (QID) | ORAL | Status: DC | PRN
  Administered 2016-11-03 – 2016-11-04 (×3): 500 mg via ORAL
  Filled 2016-11-03 (×2): qty 1

## 2016-11-03 MED ORDER — SUGAMMADEX SODIUM 200 MG/2ML IV SOLN
INTRAVENOUS | Status: DC | PRN
  Administered 2016-11-03: 200 mg via INTRAVENOUS

## 2016-11-03 SURGICAL SUPPLY — 65 items
ADH SKN CLS APL DERMABOND .7 (GAUZE/BANDAGES/DRESSINGS) ×1
APL SKNCLS STERI-STRIP NONHPOA (GAUZE/BANDAGES/DRESSINGS) ×1
BAG DECANTER FOR FLEXI CONT (MISCELLANEOUS) ×3 IMPLANT
BASKET BONE COLLECTION (BASKET) ×2 IMPLANT
BENZOIN TINCTURE PRP APPL 2/3 (GAUZE/BANDAGES/DRESSINGS) ×3 IMPLANT
BLADE CLIPPER SURG (BLADE) IMPLANT
BONE CANC CHIPS 20CC PCAN1/4 (Bone Implant) ×3 IMPLANT
BUR MATCHSTICK NEURO 3.0 LAGG (BURR) ×3 IMPLANT
CANISTER SUCT 3000ML PPV (MISCELLANEOUS) ×3 IMPLANT
CARTRIDGE OIL MAESTRO DRILL (MISCELLANEOUS) ×1 IMPLANT
CHIPS CANC BONE 20CC PCAN1/4 (Bone Implant) ×1 IMPLANT
CLOSURE WOUND 1/2 X4 (GAUZE/BANDAGES/DRESSINGS) ×1
CONT SPEC 4OZ CLIKSEAL STRL BL (MISCELLANEOUS) ×3 IMPLANT
COVER BACK TABLE 60X90IN (DRAPES) ×3 IMPLANT
DERMABOND ADVANCED (GAUZE/BANDAGES/DRESSINGS) ×2
DERMABOND ADVANCED .7 DNX12 (GAUZE/BANDAGES/DRESSINGS) IMPLANT
DIFFUSER DRILL AIR PNEUMATIC (MISCELLANEOUS) ×3 IMPLANT
DRAPE C-ARM 42X72 X-RAY (DRAPES) ×2 IMPLANT
DRAPE C-ARMOR (DRAPES) ×2 IMPLANT
DRAPE LAPAROTOMY 100X72X124 (DRAPES) ×3 IMPLANT
DRAPE POUCH INSTRU U-SHP 10X18 (DRAPES) ×3 IMPLANT
DRAPE SURG 17X23 STRL (DRAPES) ×3 IMPLANT
DRSG OPSITE POSTOP 4X6 (GAUZE/BANDAGES/DRESSINGS) ×2 IMPLANT
DURAPREP 26ML APPLICATOR (WOUND CARE) ×3 IMPLANT
ELECT REM PT RETURN 9FT ADLT (ELECTROSURGICAL) ×3
ELECTRODE REM PT RTRN 9FT ADLT (ELECTROSURGICAL) ×1 IMPLANT
EVACUATOR 1/8 PVC DRAIN (DRAIN) IMPLANT
GAUZE SPONGE 4X4 16PLY XRAY LF (GAUZE/BANDAGES/DRESSINGS) IMPLANT
GLOVE BIO SURGEON STRL SZ7 (GLOVE) ×2 IMPLANT
GLOVE BIO SURGEON STRL SZ8 (GLOVE) ×6 IMPLANT
GLOVE BIOGEL PI IND STRL 7.0 (GLOVE) IMPLANT
GLOVE BIOGEL PI INDICATOR 7.0 (GLOVE) ×2
GOWN STRL REUS W/ TWL LRG LVL3 (GOWN DISPOSABLE) IMPLANT
GOWN STRL REUS W/ TWL XL LVL3 (GOWN DISPOSABLE) ×2 IMPLANT
GOWN STRL REUS W/TWL 2XL LVL3 (GOWN DISPOSABLE) IMPLANT
GOWN STRL REUS W/TWL LRG LVL3 (GOWN DISPOSABLE) ×6
GOWN STRL REUS W/TWL XL LVL3 (GOWN DISPOSABLE) ×3
GRAFT BNE CANC CHIPS 1-8 20CC (Bone Implant) IMPLANT
HEMOSTAT POWDER KIT SURGIFOAM (HEMOSTASIS) ×2 IMPLANT
KIT BASIN OR (CUSTOM PROCEDURE TRAY) ×3 IMPLANT
KIT INFUSE X SMALL 1.4CC (Orthopedic Implant) ×2 IMPLANT
KIT ROOM TURNOVER OR (KITS) ×3 IMPLANT
NDL ASP BONE MRW 8GX15 (NEEDLE) IMPLANT
NDL HYPO 25X1 1.5 SAFETY (NEEDLE) ×1 IMPLANT
NEEDLE ASP BONE MRW 8GX15 (NEEDLE) ×3 IMPLANT
NEEDLE HYPO 25X1 1.5 SAFETY (NEEDLE) ×3 IMPLANT
NS IRRIG 1000ML POUR BTL (IV SOLUTION) ×3 IMPLANT
OIL CARTRIDGE MAESTRO DRILL (MISCELLANEOUS) ×3
PACK LAMINECTOMY NEURO (CUSTOM PROCEDURE TRAY) ×3 IMPLANT
PAD ARMBOARD 7.5X6 YLW CONV (MISCELLANEOUS) ×9 IMPLANT
ROD PC 5.5X35 TI ARSENAL (Rod) ×4 IMPLANT
SCREW CBX 5.5X35MM (Screw) ×4 IMPLANT
SCREW CBX 6.5X35MM (Screw) ×4 IMPLANT
SCREW SET SPINAL ARSENAL 47127 (Screw) ×8 IMPLANT
SPONGE LAP 4X18 X RAY DECT (DISPOSABLE) IMPLANT
SPONGE SURGIFOAM ABS GEL 100 (HEMOSTASIS) ×3 IMPLANT
STRIP CLOSURE SKIN 1/2X4 (GAUZE/BANDAGES/DRESSINGS) ×3 IMPLANT
SUT VIC AB 0 CT1 18XCR BRD8 (SUTURE) ×1 IMPLANT
SUT VIC AB 0 CT1 8-18 (SUTURE) ×3
SUT VIC AB 2-0 CP2 18 (SUTURE) ×5 IMPLANT
SUT VIC AB 3-0 SH 8-18 (SUTURE) ×6 IMPLANT
TOWEL GREEN STERILE (TOWEL DISPOSABLE) ×3 IMPLANT
TOWEL GREEN STERILE FF (TOWEL DISPOSABLE) ×3 IMPLANT
TRAY FOLEY W/METER SILVER 16FR (SET/KITS/TRAYS/PACK) IMPLANT
WATER STERILE IRR 1000ML POUR (IV SOLUTION) ×3 IMPLANT

## 2016-11-03 NOTE — H&P (Signed)
Subjective: Patient is a 36 y.o. female admitted for pseudoarthrosis 5-S1. Onset of symptoms was several years ago, gradually worsening since that time.  The pain is rated severe, and is located at the across the lower back. The pain is described as aching and occurs all day. The symptoms have been progressive. Symptoms are exacerbated by exercise. MRI or CT showed pseudoarthrosis l5s1   Past Medical History:  Diagnosis Date  . Arthritis   . Back pain    sees ortho  . Carpal tunnel syndrome   . Chronic back pain   . Depression   . Fibromyalgia    ?  Marland Kitchen. Headache(784.0)   . Hypertension    previously on. d/c'd by dr over month ago  . Shortness of breath    exertion    Past Surgical History:  Procedure Laterality Date  . ABDOMINAL EXPOSURE N/A 06/25/2015   Procedure: ABDOMINAL EXPOSURE;  Surgeon: Larina Earthlyodd F Early, MD;  Location: [facility];  Service: Vascular;  Laterality: N/A;  . ANTERIOR LUMBAR FUSION N/A 06/25/2015   Procedure: ANTERIOR LUMBAR INTERBODY  FUSION LUMBAR FIVE -SACRAL ONE;  Surgeon: Tia Alertavid S Sadia Belfiore, MD;  Location: [facility];  Service: Neurosurgery;  Laterality: N/A;  . LUMBAR LAMINECTOMY/DECOMPRESSION MICRODISCECTOMY  03/03/2012   Procedure: LUMBAR LAMINECTOMY/DECOMPRESSION MICRODISCECTOMY 1 LEVEL;  Surgeon: Karn CassisErnesto M Botero, MD;  Location: [facility];  Service: Neurosurgery;  Laterality: Left;  Left Lumbar five sacral one Diskectomy  . TUBAL LIGATION    . WISDOM TOOTH EXTRACTION      Prior to Admission medications   Medication Sig Start Date End Date Taking? Authorizing Provider  acetaminophen (TYLENOL) 500 MG tablet Take 1,000 mg by mouth every 6 (six) hours as needed for mild pain.   Yes [provider]  HYDROcodone-acetaminophen (NORCO/VICODIN) 5-325 MG tablet Take 1-2 tablets by mouth every 4 (four) hours as needed for moderate pain. 06/27/15  Yes Tia AlertJones, Yehya Brendle S, MD  methocarbamol (ROBAXIN) 500 MG tablet Take 1 tablet (500 mg total) by mouth every 6 (six) hours  as needed for muscle spasms. 06/27/15  Yes Tia AlertJones, Iola Turri S, MD   Allergies  Allergen Reactions  . Other Other (See Comments)    No blood jehovah's witness    Social History  Substance Use Topics  . Smoking status: Current Every Day Smoker    Packs/day: 0.30    Years: 10.00    Types: Cigarettes  . Smokeless tobacco: Never Used     Comment: decreased smoking  . Alcohol use 0.0 oz/week     Comment: occasional/social    Family History  Problem Relation Age of Onset  . Depression Mother   . Coronary artery disease Mother   . Alcohol abuse Mother   . Hypertension Father   . Cancer Maternal Grandmother 50       breast  . Cancer Paternal Grandmother 50       lung  . Cancer Cousin 30       colon     Review of Systems  Positive ROS: back  All other systems have been reviewed and were otherwise negative with the exception of those mentioned in the HPI and as above.  Objective: Vital signs in last 24 hours: Temp:  [97.7 F (36.5 C)] 97.7 F (36.5 C) (09/05 0705) Pulse Rate:  [62] 62 (09/05 0705) Resp:  [18] 18 (09/05 0705) BP: (125)/(78) 125/78 (09/05 0705) SpO2:  [100 %] 100 % (09/05 0705) Weight:  [103.4 kg (228 lb)] 103.4 kg (228  lb) (09/05 0705)  General Appearance: Alert, cooperative, no distress, appears stated age Head: Normocephalic, without obvious abnormality, atraumatic Eyes: PERRL, conjunctiva/corneas clear, EOM's intact    Neck: Supple, symmetrical, trachea midline Back: Symmetric, no curvature, ROM normal, no CVA tenderness Lungs:  respirations unlabored Heart: Regular rate and rhythm Abdomen: Soft, non-tender Extremities: Extremities normal, atraumatic, no cyanosis or edema Pulses: 2+ and symmetric all extremities Skin: Skin color, texture, turgor normal, no rashes or lesions  NEUROLOGIC:   Mental status: Alert and oriented x4,  no aphasia, good attention span, fund of knowledge, and memory Motor Exam - grossly normal Sensory Exam - grossly  normal Reflexes: 1= Coordination - grossly normal Gait - grossly normal Balance - grossly normal Cranial Nerves: I: smell Not tested  II: visual acuity  OS: nl    OD: nl  II: visual fields Full to confrontation  II: pupils Equal, round, reactive to light  III,VII: ptosis None  III,IV,VI: extraocular muscles  Full ROM  V: mastication Normal  V: facial light touch sensation  Normal  V,VII: corneal reflex  Present  VII: facial muscle function - upper  Normal  VII: facial muscle function - lower Normal  VIII: hearing Not tested  IX: soft palate elevation  Normal  IX,X: gag reflex Present  XI: trapezius strength  5/5  XI: sternocleidomastoid strength 5/5  XI: neck flexion strength  5/5  XII: tongue strength  Normal    Data Review Lab Results  Component Value Date   WBC 6.4 11/03/2016   HGB 10.7 (L) 11/03/2016   HCT 34.5 (L) 11/03/2016   MCV 79.5 11/03/2016   PLT 271 11/03/2016   Lab Results  Component Value Date   NA 139 10/06/2015   K 4.1 10/06/2015   CL 107 10/06/2015   CO2 22 10/06/2015   BUN 7 10/06/2015   CREATININE 0.79 10/06/2015   GLUCOSE 85 10/06/2015   Lab Results  Component Value Date   INR 0.99 11/03/2016    Assessment/Plan: Patient admitted for PLF L5-s1. Patient has failed a reasonable attempt at conservative therapy.  I explained the condition and procedure to the patient and answered any questions.  Patient wishes to proceed with procedure as planned. Understands risks/ benefits and typical outcomes of procedure.   Floris Neuhaus S 11/03/2016 8:29 AM

## 2016-11-03 NOTE — Evaluation (Signed)
Physical Therapy Evaluation Patient Details Name: Carol Gross MRN: [id number] DOB: [birth year] Today's Date: 11/03/2016   History of Present Illness  Pt is a 36 y/o female s/p L5-S1 PLIF. PMH includes arthritis, HTN,  fibromyalgia, depression, and carpal tunnel   Clinical Impression  Pt s/p surgery above with deficits below. PTA, pt was independent with functional mobility. Upon eval, pt limited by pain, weakness, and decreased balance. Pt also reporting dizziness during gait training which limited ambulation tolerance. Recommending DME below to increase independence and safety with mobility. Reports family will be able to assist as needed upon d/c. Will continue to follow to maximize functional mobility.     Follow Up Recommendations No PT follow up;Supervision for mobility/OOB    Equipment Recommendations  3in1 (PT);Cane    Recommendations for Other Services       Precautions / Restrictions Precautions Precautions: Back Precaution Booklet Issued: Yes (comment) Precaution Comments: Reviewed back precaution handout with pt Restrictions Weight Bearing Restrictions: No      Mobility  Bed Mobility Overal bed mobility: Needs Assistance Bed Mobility: Rolling;Sidelying to Sit;Sit to Sidelying Rolling: Min guard Sidelying to sit: Min guard     Sit to sidelying: Min guard General bed mobility comments: Min guard to ensure appropriate log roll technique.   Transfers Overall transfer level: Needs assistance Equipment used: 1 person hand held assist Transfers: Sit to/from Stand Sit to Stand: Min assist         General transfer comment: Min A for lift assist and steadying. Verbal cues to power through LEs  Ambulation/Gait Ambulation/Gait assistance: Min guard Ambulation Distance (Feet): 50 Feet Assistive device: 1 person hand held assist Gait Pattern/deviations: Step-through pattern;Antalgic;Decreased stride length Gait velocity: Decreased Gait velocity interpretation:  Below normal speed for age/gender General Gait Details: Slow, very guarded gait. Pt reporting back pain and RLE pain during ambulation. Verbal cues for upright posture. Education about possible need for cane given need for HHA for steadying and pt agreeable.   Stairs            Wheelchair Mobility    Modified Rankin (Stroke Patients Only)       Balance Overall balance assessment: Needs assistance Sitting-balance support: No upper extremity supported;Feet supported Sitting balance-Leahy Scale: Good     Standing balance support: Single extremity supported;No upper extremity supported;During functional activity Standing balance-Leahy Scale: Fair Standing balance comment: able to maintain static standing at sink without UE support                              Pertinent Vitals/Pain Pain Assessment: Faces Faces Pain Scale: Hurts whole lot Pain Location: back and R leg  Pain Descriptors / Indicators: Aching;Guarding;Operative site guarding Pain Intervention(s): Limited activity within patient's tolerance;Monitored during session;Repositioned    Home Living Family/patient expects to be discharged to:: Private residence Living Arrangements: Spouse/significant other;Children Available Help at Discharge: Family;Available 24 hours/day Type of Home: Apartment Home Access: Level entry     Home Layout: Multi-level Home Equipment: None      Prior Function Level of Independence: Independent               Hand Dominance        Extremity/Trunk Assessment   Upper Extremity Assessment Upper Extremity Assessment: Defer to OT evaluation    Lower Extremity Assessment Lower Extremity Assessment: RLE deficits/detail;Generalized weakness;LLE deficits/detail RLE Deficits / Details: Pain reported in RLE. Reports this is new pain.  LLE Deficits /  Details: Pain at baseline, however, reports it has decreased. Functional weakness noted.     Cervical / Trunk  Assessment Cervical / Trunk Assessment: Other exceptions Cervical / Trunk Exceptions: s/p PLIF   Communication   Communication: No difficulties  Cognition Arousal/Alertness: Awake/alert Behavior During Therapy: WFL for tasks assessed/performed Overall Cognitive Status: Within Functional Limits for tasks assessed                                        General Comments General comments (skin integrity, edema, etc.): Pt's fiance present during session. Educated about generalized exercise program to perform at home     Exercises     Assessment/Plan    PT Assessment Patient needs continued PT services  PT Problem List Decreased strength;Decreased activity tolerance;Decreased balance;Decreased mobility;Decreased knowledge of use of DME;Decreased knowledge of precautions;Pain       PT Treatment Interventions DME instruction;Gait training;Stair training;Functional mobility training;Therapeutic activities;Therapeutic exercise;Balance training;Neuromuscular re-education;Patient/family education    PT Goals (Current goals can be found in the Care Plan section)  Acute Rehab PT Goals Patient Stated Goal: to get better  PT Goal Formulation: With patient Time For Goal Achievement: 11/10/16 Potential to Achieve Goals: Good    Frequency Min 5X/week   Barriers to discharge        Co-evaluation               AM-PAC PT "6 Clicks" Daily Activity  Outcome Measure Difficulty turning over in bed (including adjusting bedclothes, sheets and blankets)?: Unable Difficulty moving from lying on back to sitting on the side of the bed? : Unable Difficulty sitting down on and standing up from a chair with arms (e.g., wheelchair, bedside commode, etc,.)?: Unable Help needed moving to and from a bed to chair (including a wheelchair)?: A Little Help needed walking in hospital room?: A Little Help needed climbing 3-5 steps with a railing? : A Little 6 Click Score: 12    End of  Session Equipment Utilized During Treatment: Gait belt Activity Tolerance: Patient limited by pain Patient left: in bed;with call bell/phone within reach;with family/visitor present Nurse Communication: Mobility status PT Visit Diagnosis: Unsteadiness on feet (R26.81);Other abnormalities of gait and mobility (R26.89);Pain Pain - part of body:  (back )    Time: 1415-1440 PT Time Calculation (min) (ACUTE ONLY): 25 min   Charges:   PT Evaluation $PT Eval Low Complexity: 1 Low PT Treatments $Gait Training: 8-22 mins   PT G Codes:        Gladys DammeBrittany Ajene Carchi, PT, DPT  Acute Rehabilitation Services  Pager: [phone]   Lehman PromBrittany S Maebry Obrien 11/03/2016, 3:34 PM

## 2016-11-03 NOTE — Transfer of Care (Signed)
Immediate Anesthesia Transfer of Care Note  Patient: Carol Gross  Procedure(s) Performed: Procedure(s): Posterior Lateral Fusion - Lumbar five-Sacrum one  with pedicle screw fixation (N/A)  Patient Location: PACU  Anesthesia Type:General  Level of Consciousness: awake, alert  and oriented  Airway & Oxygen Therapy: Patient Spontanous Breathing  Post-op Assessment: Report given to RN, Post -op Vital signs reviewed and stable and Patient moving all extremities X 4  Post vital signs: Reviewed and stable  Last Vitals:  Vitals:   11/03/16 0705  BP: 125/78  Pulse: 62  Resp: 18  Temp: 36.5 C  SpO2: 100%    Last Pain:  Vitals:   11/03/16 0802  TempSrc:   PainSc: 8       Patients Stated Pain Goal: 8 (11/03/16 0802)  Complications: No apparent anesthesia complications

## 2016-11-03 NOTE — Anesthesia Procedure Notes (Signed)
Procedure Name: Intubation Date/Time: 11/03/2016 8:55 AM Performed by: Rise PatienceBELL, Keefer Soulliere T Pre-anesthesia Checklist: Patient identified, Emergency Drugs available, Suction available and Patient being monitored Patient Re-evaluated:Patient Re-evaluated prior to induction Oxygen Delivery Method: Circle System Utilized Preoxygenation: Pre-oxygenation with 100% oxygen Induction Type: IV induction Ventilation: Mask ventilation without difficulty Laryngoscope Size: Miller and 2 Grade View: Grade I Tube type: Oral Tube size: 7.5 mm Number of attempts: 1 Airway Equipment and Method: Stylet and Oral airway Placement Confirmation: ETT inserted through vocal cords under direct vision,  positive ETCO2 and breath sounds checked- equal and bilateral Secured at: 22 cm Tube secured with: Tape Dental Injury: Teeth and Oropharynx as per pre-operative assessment

## 2016-11-03 NOTE — Telephone Encounter (Signed)
Referral placed per Dr. Yetta BarreJones' request.

## 2016-11-03 NOTE — Anesthesia Preprocedure Evaluation (Addendum)
Anesthesia Evaluation  Patient identified by MRN, date of birth, ID band Patient awake    Reviewed: Allergy & Precautions, NPO status , Patient's Chart, lab work & pertinent test results  Airway Mallampati: I  TM Distance: >3 FB Neck ROM: Full    Dental no notable dental hx. (+) Dental Advisory Given   Pulmonary Current Smoker,    breath sounds clear to auscultation       Cardiovascular hypertension,  Rhythm:Regular Rate:Normal     Neuro/Psych    GI/Hepatic (+)     substance abuse  marijuana use,   Endo/Other  Morbid obesity  Renal/GU      Musculoskeletal  (+) Arthritis , Fibromyalgia -  Abdominal (+) + obese,   Peds  Hematology   Anesthesia Other Findings   Reproductive/Obstetrics                            Anesthesia Physical Anesthesia Plan  ASA: III  Anesthesia Plan: General   Post-op Pain Management:    Induction: Intravenous  PONV Risk Score and Plan: 3 and Ondansetron, Dexamethasone, Midazolam, Propofol infusion and Treatment may vary due to age or medical condition  Airway Management Planned: Oral ETT  Additional Equipment:   Intra-op Plan:   Post-operative Plan: Extubation in OR  Informed Consent: I have reviewed the patients History and Physical, chart, labs and discussed the procedure including the risks, benefits and alternatives for the proposed anesthesia with the patient or authorized representative who has indicated his/her understanding and acceptance.   Dental advisory given  Plan Discussed with: CRNA  Anesthesia Plan Comments:         Anesthesia Quick Evaluation

## 2016-11-03 NOTE — Op Note (Signed)
11/03/2016  10:50 AM  PATIENT:  Carol Gross  36 y.o. female  PRE-OPERATIVE DIAGNOSIS:  Pseudoarthrosis L5-S1  POST-OPERATIVE DIAGNOSIS:  same  PROCEDURE:    1. Posterior fixation L5-S1 using ATEC cortical pedicle screws.  2. Intertransverse arthrodesis L5-S1 using morcellized autograft and extra small BMP /allograft soaked with a bone marrow aspirate obtained from the right iliac crest through a separate fascial incision.  SURGEON:  Marikay Alaravid Keyaan Lederman, MD  ASSISTANTS: Dr. Franky Machoabbell  ANESTHESIA:  General  EBL: 30 ml  Total I/O In: 1000 [I.V.:1000] Out: 30 [Blood:30]  BLOOD ADMINISTERED:none  DRAINS: none   INDICATION FOR PROCEDURE: This patient presented with back pain. Imaging revealed pseudoarthrosis after anterior lumbar interbody fusion L5-S1 in the remote past. The patient tried a reasonable attempt at conservative medical measures without relief. I recommended instrumented fusion to address the pseudoarthrosis in the hopes that this would help her chronic back pain.  Patient understood the risks, benefits, and alternatives and potential outcomes and wished to proceed.  PROCEDURE DETAILS:  The patient was brought to the operating room. After induction of generalized endotracheal anesthesia the patient was rolled into the prone position on chest rolls and all pressure points were padded. The patient's lumbar region was cleaned and then prepped with DuraPrep and draped in the usual sterile fashion. Anesthesia was injected and then a dorsal midline incision was made and carried down to the lumbosacral fascia. The fascia was opened and the paraspinous musculature was taken down in a subperiosteal fashion to expose L5-S1. There had been a previous left hemilaminectomy done in the remote past by another surgeon. We were careful to work around this scar tissue. A self-retaining retractor was placed. Intraoperative fluoroscopy confirmed my level, and I started with placement of the L5 and S1  cortical pedicle screws. The pedicle screw entry zones were identified utilizing surface landmarks and  AP and lateral fluoroscopy. I scored the cortex with the high-speed drill and then used the hand drill to drill an upward and outward direction into the pedicle. I then tapped line to line. I drilled away the inferior half of the facet of L5 bilaterally to expose the sacral screw entry zone. The autograft was saved for later arthrodesis. I then placed a 5.5 x 35 mm cortical pedicle screw into the pedicles of L5 bilaterally, and 6.5 x 35 mm pedicle screws into the sacrum bilaterally. I then dissected in a suprafascial plane to expose the right iliac crest. A small fascial opening was made and the Stryker bone marrow aspirate needle was used to remove about 30 mL of bone marrow aspirate from the right iliac crest. This was then soaked into the morcellized allograft. We dried the iliac crest with Surgifoam and then closed the fascia with 0 Vicryl. We then decorticated the transverse processes and laid a mixture of morcellized autograft and allograft (an extra small BMP was used because of the previous pseudoarthrosis and her smoking history) out over these to perform intertransverse arthrodesis at L5-S1. We then placed lordotic rods into the multiaxial screw heads of the pedicle screws and locked these in position with the locking caps and anti-torque device. We then checked our construct with AP and lateral fluoroscopy. Irrigated with copious amounts of bacitracin-containing saline solution. We placed powdered vancomycin into the wound, and closed the muscle and the fascia with 0 Vicryl. Closed the subcutaneous tissues with 2-0 Vicryl and subcuticular tissues with 3-0 Vicryl. The skin was closed with benzoin and Steri-Strips. Dressing was then applied, the  patient was awakened from general anesthesia and transported to the recovery room in stable condition. At the end of the procedure all sponge, needle and  instrument counts were correct.   PLAN OF CARE: admit to inpatient  PATIENT DISPOSITION:  PACU - hemodynamically stable.   Delay start of Pharmacological VTE agent (>24hrs) due to surgical blood loss or risk of bleeding:  yes

## 2016-11-03 NOTE — Telephone Encounter (Signed)
Dr Marikay Alaravid Jones: needs referral to [facility]

## 2016-11-03 NOTE — Anesthesia Postprocedure Evaluation (Signed)
Anesthesia Post Note  Patient: Carol Gross  Procedure(s) Performed: Procedure(s) (LRB): Posterior Lateral Fusion - Lumbar five-Sacrum one  with pedicle screw fixation (N/A)     Patient location during evaluation: PACU Anesthesia Type: General Level of consciousness: awake and sedated Pain management: pain level controlled Vital Signs Assessment: post-procedure vital signs reviewed and stable Respiratory status: spontaneous breathing, nonlabored ventilation, respiratory function stable and patient connected to nasal cannula oxygen Cardiovascular status: blood pressure returned to baseline and stable Postop Assessment: no signs of nausea or vomiting Anesthetic complications: no    Last Vitals:  Vitals:   11/03/16 1135 11/03/16 1150  BP: 121/80 127/87  Pulse: (!) 48 (!) 52  Resp: 16 16  Temp:  36.6 C  SpO2: 95% 100%    Last Pain:  Vitals:   11/03/16 1135  TempSrc:   PainSc: Asleep                 Paddy Neis,JAMES TERRILL

## 2016-11-04 MED ORDER — DIPHENHYDRAMINE HCL 25 MG PO CAPS
25.0000 mg | ORAL_CAPSULE | Freq: Every evening | ORAL | Status: DC | PRN
  Administered 2016-11-04: 25 mg via ORAL
  Filled 2016-11-04 (×2): qty 1

## 2016-11-04 MED ORDER — METHOCARBAMOL 500 MG PO TABS: 500.0000 mg | ORAL_TABLET | Freq: Four times a day (QID) | ORAL | 1 refills | Status: DC | PRN

## 2016-11-04 MED ORDER — HYDROCODONE-ACETAMINOPHEN 5-325 MG PO TABS: 1.0000 | ORAL_TABLET | Freq: Four times a day (QID) | ORAL | 0 refills | Status: DC | PRN

## 2016-11-04 NOTE — Progress Notes (Signed)
OT Cancellation Note  Patient Details Name: Carol Gross MRN: [id number] DOB: May 24, 1980   Cancelled Treatment:    Reason Eval/Treat Not Completed: OT screened, no needs identified, will sign off  Pilar GrammesMathews, Jovanie Verge H 11/04/2016, 9:33 AM

## 2016-11-04 NOTE — Progress Notes (Signed)
Patient alert and oriented, mae's well, voiding adequate amount of urine, swallowing without difficulty, c/o mild pain at time of discharge. Patient discharged home with family. Script and discharged instructions given to patient. Patient and family stated understanding of instructions given. Patient has an appointment with Dr. Jones   

## 2016-11-04 NOTE — Progress Notes (Signed)
Physical Therapy Treatment Patient Details Name: Carol Gross MRN: [id number] DOB: 25-Aug-1980 Today's Date: 11/04/2016    History of Present Illness Pt is a 36 y/o female s/p L5-S1 PLIF. PMH includes arthritis, HTN,  fibromyalgia, depression, and carpal tunnel     PT Comments    Pt seen for mobility progression and successfully completed stair training this session. PT will continue to follow acutely to ensure a safe d/c home.   Follow Up Recommendations  No PT follow up;Supervision for mobility/OOB     Equipment Recommendations  3in1 (PT)    Recommendations for Other Services       Precautions / Restrictions Precautions Precautions: Back Precaution Comments: Pt able to recall 2/3 back precautions with pt Restrictions Weight Bearing Restrictions: No    Mobility  Bed Mobility               General bed mobility comments: pt OOB in recliner chair upon arrival  Transfers Overall transfer level: Needs assistance Equipment used: None Transfers: Sit to/from Stand Sit to Stand: Supervision         General transfer comment: supervision for safety  Ambulation/Gait Ambulation/Gait assistance: Supervision Ambulation Distance (Feet): 300 Feet Assistive device: None Gait Pattern/deviations: Step-through pattern;Antalgic;Decreased stride length Gait velocity: Decreased Gait velocity interpretation: Below normal speed for age/gender General Gait Details: very slow and guarded gait, modest antalgic gait with reported R hip pain   Stairs Stairs: Yes   Stair Management: One rail Left;Step to pattern;Forwards Number of Stairs: 8 General stair comments: min guard for safety  Wheelchair Mobility    Modified Rankin (Stroke Patients Only)       Balance Overall balance assessment: Needs assistance Sitting-balance support: No upper extremity supported;Feet supported Sitting balance-Leahy Scale: Good     Standing balance support: No upper extremity  supported Standing balance-Leahy Scale: Fair                              Cognition Arousal/Alertness: Awake/alert Behavior During Therapy: WFL for tasks assessed/performed Overall Cognitive Status: Within Functional Limits for tasks assessed                                        Exercises      General Comments        Pertinent Vitals/Pain Pain Assessment: 0-10 Pain Score: 9  Pain Location: back and R hip Pain Descriptors / Indicators: Aching;Guarding;Operative site guarding Pain Intervention(s): Monitored during session;Repositioned    Home Living                      Prior Function            PT Goals (current goals can now be found in the care plan section) Acute Rehab PT Goals PT Goal Formulation: With patient Time For Goal Achievement: 11/10/16 Potential to Achieve Goals: Good Progress towards PT goals: Progressing toward goals    Frequency    Min 5X/week      PT Plan Current plan remains appropriate    Co-evaluation              AM-PAC PT "6 Clicks" Daily Activity  Outcome Measure  Difficulty turning over in bed (including adjusting bedclothes, sheets and blankets)?: A Lot Difficulty moving from lying on back to sitting on the side of the bed? : A Lot  Difficulty sitting down on and standing up from a chair with arms (e.g., wheelchair, bedside commode, etc,.)?: A Lot Help needed moving to and from a bed to chair (including a wheelchair)?: None Help needed walking in hospital room?: None Help needed climbing 3-5 steps with a railing? : A Little 6 Click Score: 17    End of Session   Activity Tolerance: Patient tolerated treatment well Patient left: in chair;with call bell/phone within reach Nurse Communication: Mobility status PT Visit Diagnosis: Unsteadiness on feet (R26.81);Other abnormalities of gait and mobility (R26.89);Pain Pain - Right/Left: Right Pain - part of body: Hip (and back)     Time:  [id number] PT Time Calculation (min) (ACUTE ONLY): 24 min  Charges:  $Gait Training: 23-37 mins                    G Codes:       PrivateerJennifer Jeweline Gross, [place], [place] [phone]    Carol BevelsJennifer M Arish Gross 11/04/2016, 9:16 AM

## 2016-11-04 NOTE — Discharge Summary (Signed)
Physician Discharge Summary  Patient ID: Carol Gross MRN: [id number] DOB/AGE: 34-Mar-1982 34 y.o.  Admit date: 11/03/2016 Discharge date: 11/04/2016  Admission Diagnoses: pseudoarthrosis l5-s1    Discharge Diagnoses: same   Discharged Condition: good  Hospital Course: The patient was admitted on 11/03/2016 and taken to the operating room where the patient underwent fusion L5-S1. The patient tolerated the procedure well and was taken to the recovery room and then to the floor in stable condition. The hospital course was routine. There were no complications. The wound remained clean dry and intact. Pt had appropriate back soreness. No complaints of leg pain or new N/T/W. The patient remained afebrile with stable vital signs, and tolerated a regular diet. The patient continued to increase activities, and pain was well controlled with oral pain medications.   Consults: None  Significant Diagnostic Studies:  Results for orders placed or performed during the hospital encounter of 11/03/16  Surgical pcr screen  Result Value Ref Range   MRSA, PCR NEGATIVE NEGATIVE   Staphylococcus aureus POSITIVE (A) NEGATIVE  Basic metabolic panel  Result Value Ref Range   Sodium 136 135 - 145 mmol/L   Potassium 3.5 3.5 - 5.1 mmol/L   Chloride 105 101 - 111 mmol/L   CO2 24 22 - 32 mmol/L   Glucose, Bld 99 65 - 99 mg/dL   BUN 7 6 - 20 mg/dL   Creatinine, Ser 4.13 0.44 - 1.00 mg/dL   Calcium 8.7 (L) 8.9 - 10.3 mg/dL   GFR calc non Af Amer >60 >60 mL/min   GFR calc Af Amer >60 >60 mL/min   Anion gap 7 5 - 15  CBC WITH DIFFERENTIAL  Result Value Ref Range   WBC 6.4 4.0 - 10.5 K/uL   RBC 4.34 3.87 - 5.11 MIL/uL   Hemoglobin 10.7 (L) 12.0 - 15.0 g/dL   HCT 24.4 (L) 01.0 - 27.2 %   MCV 79.5 78.0 - 100.0 fL   MCH 24.7 (L) 26.0 - 34.0 pg   MCHC 31.0 30.0 - 36.0 g/dL   RDW 53.6 (H) 64.4 - 03.4 %   Platelets 271 150 - 400 K/uL   Neutrophils Relative % 60 %   Neutro Abs 3.9 1.7 - 7.7 K/uL   Lymphocytes Relative 33 %   Lymphs Abs 2.1 0.7 - 4.0 K/uL   Monocytes Relative 6 %   Monocytes Absolute 0.4 0.1 - 1.0 K/uL   Eosinophils Relative 1 %   Eosinophils Absolute 0.1 0.0 - 0.7 K/uL   Basophils Relative 0 %   Basophils Absolute 0.0 0.0 - 0.1 K/uL  Protime-INR  Result Value Ref Range   Prothrombin Time 13.0 11.4 - 15.2 seconds   INR 0.99   hCG, serum, qualitative  Result Value Ref Range   Preg, Serum NEGATIVE NEGATIVE  No blood products  Result Value Ref Range   Transfuse no blood products      TRANSFUSE NO BLOOD PRODUCTS, VERIFIED BY Lovett Sox RN 11/03/16    Chest 2 View  Result Date: 11/03/2016 CLINICAL DATA:  Pre lumbar spine surgery evaluation. EXAM: CHEST  2 VIEW COMPARISON:  06/17/2015. FINDINGS: Normal sized heart. Clear lungs. Minimal thoracic spine degenerative changes. IMPRESSION: No acute abnormality. Electronically Signed   By: Beckie Salts M.D.   On: 11/03/2016 07:48   Dg Lumbar Spine 2-3 Views  Result Date: 11/03/2016 CLINICAL DATA:  Intraoperative fluoro spot radiographs from L5-S1 pedicle screw fixation. Reported fluoro time is 1 minutes 2 seconds EXAM: LUMBAR SPINE -  2-3 VIEW; DG C-ARM 61-120 MIN COMPARISON:  Fluoro spot radiographs from a procedure of June 25, 2015 FINDINGS: A pre-existing anterior fusion device at L5-S1 is demonstrated and appears stable. The patient has undergone inter discal device placement and pedicle screw fixation across the L5-S1 disc space. There may have been replacement of the intradiscal device. IMPRESSION: No immediate complication observed on the intraoperative fluoro spot radiographs from a posterior pedicle fusion at L5-S1 and possible intradiscal device replacement. Electronically Signed   By: Yusra Ravert  [place] M.D.   On: 11/03/2016 10:50   Dg C-arm 1-60 Min  Result Date: 11/03/2016 CLINICAL DATA:  Intraoperative fluoro spot radiographs from L5-S1 pedicle screw fixation. Reported fluoro time is 1 minutes 2 seconds EXAM: LUMBAR  SPINE - 2-3 VIEW; DG C-ARM 61-120 MIN COMPARISON:  Fluoro spot radiographs from a procedure of June 25, 2015 FINDINGS: A pre-existing anterior fusion device at L5-S1 is demonstrated and appears stable. The patient has undergone inter discal device placement and pedicle screw fixation across the L5-S1 disc space. There may have been replacement of the intradiscal device. IMPRESSION: No immediate complication observed on the intraoperative fluoro spot radiographs from a posterior pedicle fusion at L5-S1 and possible intradiscal device replacement. Electronically Signed   By: Talayah Picardi  [place] M.D.   On: 11/03/2016 10:50    Antibiotics:  Anti-infectives    Start     Dose/Rate Route Frequency Ordered Stop   11/03/16 1700  ceFAZolin (ANCEF) IVPB 2g/100 mL premix     2 g 200 mL/hr over 30 Minutes Intravenous Every 8 hours 11/03/16 1249 11/04/16 0124   11/03/16 1035  vancomycin (VANCOCIN) powder  Status:  Discontinued       As needed 11/03/16 1035 11/03/16 1055   11/03/16 0943  50,000 units bacitracin in 0.9% normal saline 250 mL irrigation  Status:  Discontinued       As needed 11/03/16 0943 11/03/16 1055   11/03/16 0731  ceFAZolin (ANCEF) 2-4 GM/100ML-% IVPB  Status:  Discontinued    Comments:  Kathrene Bongo   : cabinet override      11/03/16 0731 11/03/16 0735   11/03/16 0704  ceFAZolin (ANCEF) IVPB 2g/100 mL premix     2 g 200 mL/hr over 30 Minutes Intravenous On call to O.R. 11/03/16 0704 11/03/16 0910      Discharge Exam: Blood pressure (!) 114/55, pulse (!) 55, temperature 98.1 F (36.7 C), temperature source Oral, resp. rate 18, height  (1.676 m), weight 103.4 kg (228 lb), last menstrual period 10/05/2016, SpO2 100 %. Neurologic: Grossly normal Dressing dry  Discharge Medications:   Allergies as of 11/04/2016      Reactions   Other Other (See Comments)   No blood jehovah's witness      Medication List    TAKE these medications   acetaminophen 500 MG tablet Commonly known  as:  TYLENOL Take 1,000 mg by mouth every 6 (six) hours as needed for mild pain.   HYDROcodone-acetaminophen 5-325 MG tablet Commonly known as:  NORCO/VICODIN Take 1-2 tablets by mouth every 6 (six) hours as needed for moderate pain. What changed:  when to take this   methocarbamol 500 MG tablet Commonly known as:  ROBAXIN Take 1 tablet (500 mg total) by mouth every 6 (six) hours as needed for muscle spasms.            Durable Medical Equipment        Start     Ordered   11/03/16 1250  DME Dan Humphreys  rolling  Once    Question:  Patient needs a walker to treat with the following condition  Answer:  S/P lumbar fusion   11/03/16 1249   11/03/16 1250  DME 3 n 1  Once     11/03/16 1249       Discharge Care Instructions        Start     Ordered   11/04/16 0000  HYDROcodone-acetaminophen (NORCO/VICODIN) 5-325 MG tablet  Every 6 hours PRN     11/04/16 0726   11/04/16 0000  methocarbamol (ROBAXIN) 500 MG tablet  Every 6 hours PRN     11/04/16 0726   11/04/16 0000  Increase activity slowly     11/04/16 0726   11/04/16 0000  Diet - low sodium heart healthy     11/04/16 0726   11/04/16 0000  Call MD for:  temperature >100.4     11/04/16 0726   11/04/16 0000  Call MD for:  persistant nausea and vomiting     11/04/16 0726   11/04/16 0000  Call MD for:  severe uncontrolled pain     11/04/16 0726   11/04/16 0000  Call MD for:  redness, tenderness, or signs of infection (pain, swelling, redness, odor or green/yellow discharge around incision site)     11/04/16 0726   11/04/16 0000  Call MD for:  difficulty breathing, headache or visual disturbances     11/04/16 0726   11/04/16 0000   Remove dressing in 72 hours     11/04/16 0726      Disposition: home   Final Dx: lumbar fusion  Discharge Instructions     Remove dressing in 72 hours    Complete by:  As directed    Call MD for:  difficulty breathing, headache or visual disturbances    Complete by:  As directed    Call MD  for:  persistant nausea and vomiting    Complete by:  As directed    Call MD for:  redness, tenderness, or signs of infection (pain, swelling, redness, odor or green/yellow discharge around incision site)    Complete by:  As directed    Call MD for:  severe uncontrolled pain    Complete by:  As directed    Call MD for:  temperature >100.4    Complete by:  As directed    Diet - low sodium heart healthy    Complete by:  As directed    Increase activity slowly    Complete by:  As directed          Signed: Gehrig Patras S 11/04/2016, 7:27 AM

## 2016-11-05 ENCOUNTER — Encounter (HOSPITAL_COMMUNITY): Payer: Self-pay | Admitting: Neurological Surgery

## 2016-11-15 ENCOUNTER — Encounter: Payer: Self-pay | Admitting: Internal Medicine

## 2016-11-15 ENCOUNTER — Ambulatory Visit (INDEPENDENT_AMBULATORY_CARE_PROVIDER_SITE_OTHER): Payer: Medicaid Other | Admitting: Internal Medicine

## 2016-11-15 VITALS — BP 122/90 | HR 68 | Ht 67.0 in | Wt 231.0 lb

## 2016-11-15 DIAGNOSIS — Z77120 Contact with and (suspected) exposure to mold (toxic): Secondary | ICD-10-CM | POA: Diagnosis present

## 2016-11-15 NOTE — Patient Instructions (Signed)
Ms. Carol Gross,  Thank you for coming in today. I hope your housing situation improves quickly!  When you are ready to discuss quitting smoking, please come back to see [place]. Both smoking and living in a damp environment could be contributing to scratchy throat.  Best, Dr. Sampson Goon

## 2016-11-15 NOTE — Progress Notes (Signed)
[facility] Progress Note  Subjective:  Carol Gross is a 36 y.o. female with history of chronic back pain s/p recent lumbar spinal fusion, substance abuse, and mood disorder who presents with concern for mold exposure. Patient says housing agency paid for her family to stay in a hotel for 1 week earlier this month while a leak was getting fixed. Says a leak over a light in her bedroom has been fixed about 6 times over the last 10 years. When, family returned, she noticed what looked like mold in vents and apartment smelled like mold and was damp. Patient has had a cough and scratchy throat that wakes her up at night recently. She also had a headache last week, but it resolved after taking pain medication for her back. Scratchy throat had been a problem about a year ago and returned recently. She does smoke cigarettes. Plans to move in with her dad while the apartment is renovated. ROS: No fever, no SOB  Allergies  Allergen Reactions  . Other Other (See Comments)    No blood jehovah's witness   Objective: Blood pressure 122/90, pulse 68, height  (1.702 m), weight 231 lb (104.8 kg). Body mass index is 36.18 kg/m. Constitutional: Obese female in NAD, pleasant HENT: MMM, normal posterior oropharynx Cardiovascular: RRR, S1, S2, no m/r/g.  Pulmonary/Chest: Effort normal and breath sounds normal. No respiratory distress.  Abdominal: Soft. +BS, NT, ND Neurological: AOx3, no focal deficits. Skin: Skin is warm and dry. No rash noted.  Psychiatric: Normal mood and affect.  Vitals reviewed  Assessment/Plan: Suspected exposure to mold - Patient with symptoms of scratchy throat and occasional cough. Reassured patient that she has a normal lung exam. Strongly recommended smoking cessation, though patient not ready at this time. 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 Provided letter for patient stating importance of proper ventilation.   Follow-up prn.  Dani Gobble, MD [facility], PGY-3

## 2016-11-16 DIAGNOSIS — Z77120 Contact with and (suspected) exposure to mold (toxic): Secondary | ICD-10-CM | POA: Insufficient documentation

## 2016-11-16 NOTE — Assessment & Plan Note (Addendum)
-   Patient with symptoms of scratchy throat and occasional cough. Reassured patient that she has a normal lung exam. Strongly recommended smoking cessation, though patient not ready at this time. 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 Provided letter for patient stating importance of proper ventilation.

## 2017-01-25 ENCOUNTER — Ambulatory Visit: Payer: Medicaid Other | Admitting: Family Medicine

## 2017-05-03 ENCOUNTER — Other Ambulatory Visit: Payer: Self-pay | Admitting: Family Medicine

## 2017-05-03 DIAGNOSIS — F322 Major depressive disorder, single episode, severe without psychotic features: Secondary | ICD-10-CM

## 2017-06-07 ENCOUNTER — Ambulatory Visit: Payer: Medicaid Other | Admitting: Family Medicine

## 2017-06-07 ENCOUNTER — Other Ambulatory Visit: Payer: Self-pay

## 2017-06-07 ENCOUNTER — Encounter: Payer: Self-pay | Admitting: Family Medicine

## 2017-06-07 VITALS — BP 118/82 | HR 79 | Temp 98.1°F | Ht 67.0 in | Wt 218.4 lb

## 2017-06-07 DIAGNOSIS — F321 Major depressive disorder, single episode, moderate: Secondary | ICD-10-CM | POA: Diagnosis not present

## 2017-06-07 DIAGNOSIS — G8929 Other chronic pain: Secondary | ICD-10-CM

## 2017-06-07 DIAGNOSIS — F331 Major depressive disorder, recurrent, moderate: Secondary | ICD-10-CM | POA: Diagnosis not present

## 2017-06-07 DIAGNOSIS — M545 Low back pain, unspecified: Secondary | ICD-10-CM

## 2017-06-07 MED ORDER — VENLAFAXINE HCL 37.5 MG PO TABS: 37.5000 mg | ORAL_TABLET | Freq: Two times a day (BID) | ORAL | 1 refills | Status: DC

## 2017-06-07 NOTE — Patient Instructions (Signed)
It was a pleasure to see you today! Thank you for choosing [facility] for your primary care. Carol Gross was seen for mood, back pain.   Our plans for today were:  Start the new medicine, called venlafaxine or effexor. Come back in 4 weeks to check on how this is doing.   I will send a referral for pain management to another office.   You should return to our clinic to see Dr. Chanetta Marshallimberlake in 4 weeks for mood.   Best,  Dr. Chanetta Marshallimberlake

## 2017-06-07 NOTE — Assessment & Plan Note (Signed)
Patient recently received epidural steroids at [facility] after her second revision surgery this fall, states these did not help at all.  She requests referral for alternative pain management office, which is reasonable.  Congratulated her on her weight loss as this likely contributes to decreased pain.  Will refer to [facility].

## 2017-06-07 NOTE — Assessment & Plan Note (Addendum)
Gad 7 score of 16 PHQ 9 score of 11, somewhat difficult.  Previously on Cymbalta and Wellbutrin.  Experience side effects from these and self discontinued.  This was about 2 months ago.  Will restart alternative SNRI for both depression/anxiety and hopefully to help with chronic pain.  Will recheck in 4 weeks, and consider increasing dose of venlafaxine at that time.

## 2017-06-07 NOTE — Progress Notes (Signed)
   CC: mood, back pain  HPI  Back pain - no better after recent epidural injection and surgery. No pain meds anymore. Doesn't want more surgeries.  Congratulated patient on recent approximate 15 pound weight loss.  She is pleased with this.  She was previously managed by pain management within the [facility] office, has not seen any other pain management options.  Would like to see another pain management office, is interested inCBD oil.   Mood - stopped cymbalta after recent surgery. Also woke up with sweats. Not been on wellbutrin for a while. States she was having bad dreams. Not hearing voices, but having to talk to herself. Scared of driving on the highway, which is new. Knows she needs to get her GED, took steps to work on this today. Cant recall previous meds before cymbalta.   Menses - having every scant bloody discharge every 2 weeks, mucuousy discharge. Hx of BTL.  Asked patient to schedule another appointment in the next couple of weeks to discuss this, given time for discussing above complaints.   ROS: Denies CP, SOB, abdominal pain, dysuria, changes in BMs.   CC, SH/smoking status, and VS noted  Objective: BP 118/82   Pulse 79   Temp 98.1 F (36.7 C) (Oral)   Ht 5\' 7"  (1.702 m)   Wt 218 lb 6.4 oz (99.1 kg)   LMP 06/06/2017   SpO2 94%   BMI 34.21 kg/m  Gen: NAD, alert, cooperative, and pleasant. HEENT: NCAT, EOMI, PERRL CV: RRR, no murmur Resp: CTAB, no wheezes, non-labored Abd: SNTND, BS present, no guarding or organomegaly Ext: No edema, warm Neuro: Alert and oriented, Speech clear, No gross deficits  Assessment and plan:  Chronic low back pain Patient recently received epidural steroids at [facility] after her second revision surgery this fall, states these did not help at all.  She requests referral for alternative pain management office, which is reasonable.  Congratulated her on her weight loss as this likely contributes to decreased pain.   Will refer to [facility].  Major depressive disorder, single episode, moderate (HCC) Gad 7 score of 16 PHQ 9 score of 11, somewhat difficult.  Previously on Cymbalta and Wellbutrin.  Experience side effects from these and self discontinued.  This was about 2 months ago.  Will restart alternative SNRI for both depression/anxiety and hopefully to help with chronic pain.  Will recheck in 4 weeks, and consider increasing dose of venlafaxine at that time.   Orders Placed This Encounter  Procedures  . Ambulatory referral to Pain Clinic    Referral Priority:   Routine    Referral Type:   Consultation    Referral Reason:   Specialty Services Required    Requested Specialty:   Pain Medicine    Number of Visits Requested:   1      Loni MuseKate Analleli Gierke, MD, PGY2 06/07/2017 1:39 PM

## 2017-07-05 ENCOUNTER — Ambulatory Visit: Payer: Medicaid Other | Admitting: Family Medicine

## 2017-08-17 ENCOUNTER — Ambulatory Visit: Payer: Medicaid Other | Admitting: Family Medicine

## 2017-08-24 ENCOUNTER — Encounter: Payer: Self-pay | Admitting: Family Medicine

## 2017-08-24 ENCOUNTER — Other Ambulatory Visit: Payer: Self-pay

## 2017-08-24 ENCOUNTER — Ambulatory Visit (INDEPENDENT_AMBULATORY_CARE_PROVIDER_SITE_OTHER): Payer: Medicaid Other | Admitting: Family Medicine

## 2017-08-24 VITALS — BP 112/82 | HR 81 | Temp 98.1°F | Ht 67.0 in | Wt 219.8 lb

## 2017-08-24 DIAGNOSIS — Z981 Arthrodesis status: Secondary | ICD-10-CM

## 2017-08-24 DIAGNOSIS — F322 Major depressive disorder, single episode, severe without psychotic features: Secondary | ICD-10-CM

## 2017-08-24 DIAGNOSIS — M5416 Radiculopathy, lumbar region: Secondary | ICD-10-CM | POA: Diagnosis not present

## 2017-08-24 MED ORDER — PAROXETINE HCL 20 MG PO TABS: 20.0000 mg | ORAL_TABLET | Freq: Every day | ORAL | 0 refills | Status: DC

## 2017-08-24 MED ORDER — IBUPROFEN 400 MG PO TABS: 400.0000 mg | ORAL_TABLET | Freq: Four times a day (QID) | ORAL | 0 refills | Status: DC | PRN

## 2017-08-24 NOTE — Patient Instructions (Addendum)
It was a pleasure to see you today! Thank you for choosing [facility] for your primary care. Carol Gross was seen for back pain.   Our plans for today were:  Start the paxil, come back in 1 month to see me and check on this.   I placed a referral for physical medicine and rehab.   Continue tylenol and ibuprofen.   When you're going through tough times, it's easy to feel lonely and overwhelmed. Remember that YOU ARE NOT ALONE and we at [facility] want to support you during this difficult time.  There are many ways to get help and support when you are ready.  **You can call the following help lines: - National suicide hotline (95634800351-503-047-8969) - National Hopeline Network (1-800-SUICIDE)  ** You can schedule an appointment with a team member at [facility] - your primary care doc or one of our counselors.  We are all here to support you. A doctor is on call 24/7, so call us if you need to [phone]).  ** There are many treatments that can help people during difficult times, and we can talk with you about medications, counseling, diet, and other options. We want to offer you HOPE that it won't always feel this bad.  ** If you are seriously thinking about hurting yourself or have a plan, please call 911 or go to any emergency room right away for immediate help.   You should return to our clinic to see Dr. Chanetta Marshallimberlake in 1 month for medication management.   Best,  Dr. Chanetta Marshallimberlake

## 2017-08-24 NOTE — Progress Notes (Signed)
   CC: chronic pain, mood  HPI  Chronic back pain - original mechanism was swimming injury. Last surgery was last June/July. Was seen by pain management at the neuro office, they took her off her pain medicine because she told them that it wasn't working for her and she had self increased because the pain was so bad. Tried gabapentin without help. Can't recall trying tramadol. norco was last rx 3.5. Tried cymbalta in the past with no effect, recently tried venlafaxine here but states she had nightmares and sweats on it. Dr. Yetta BarreJones is her neuro surgeon.   Cries herself to sleep. Bilateral shoulders are hurting in the front. Back hurts on R lumbar area.   Tried epidural steroids, only worked 2 weeks, not worth it to her due to this timing of relief. Scared to do the machine with medicine that goes into it.  Wants to try CBD oils and hemp.   Anxiety - had a bad dream on effexor, still having sweats. Feels things are getting worse. She self d/c'd venlafxine after one month. Mom and sister use paxil with good effect. She wants to try this.   ROS: Denies CP, SOB, abdominal pain, dysuria, changes in BMs.   CC, SH/smoking status, and VS noted  Objective: BP 112/82   Pulse 81   Temp 98.1 F (36.7 C) (Oral)   Ht 5\' 7"  (1.702 m)   Wt 219 lb 12.8 oz (99.7 kg)   SpO2 99%   BMI 34.43 kg/m  Gen: NAD, alert, cooperative, and pleasant. HEENT: NCAT, EOMI, PERRL CV: RRR, no murmur Resp: CTAB, no wheezes, non-labored Abd: SNTND, BS present, no guarding or organomegaly Ext: No edema, warm. TTP R paraspinal musculature, R buttocks. No rash, no weakness. Ambulated in room slowly with pain.  Neuro: Alert and oriented, Speech clear, No gross deficits  Assessment and plan:  Depression, major Persistent symptoms (tearful, chronic pain, wants to lie in bed all day), exacerbated by family situation, wants to try paxil as this has helped family members. We will start with 20mg , follow up in 1 month.  Counseled on black box warnings.   S/P lumbar spinal fusion We are running out of options and both of us are disheartened by her pain. No red flags or new symptoms, but persistent pain limiting her quality of life. Will refer to PMR. Will also rx ibuprofen for PRN use as she has medication cost issues and this will be cheaper. We will not do narcotics for this problem.    Orders Placed This Encounter  Procedures  . Ambulatory referral to Physical Medicine Rehab    Referral Priority:   Routine    Referral Type:   Rehabilitation    Referral Reason:   Specialty Services Required    Requested Specialty:   Physical Medicine and Rehabilitation    Number of Visits Requested:   1    Meds ordered this encounter  Medications  . PARoxetine (PAXIL) 20 MG tablet    Sig: Take 1 tablet (20 mg total) by mouth daily.    Dispense:  90 tablet    Refill:  0  . ibuprofen (ADVIL,MOTRIN) 400 MG tablet    Sig: Take 1 tablet (400 mg total) by mouth every 6 (six) hours as needed.    Dispense:  30 tablet    Refill:  0    Loni MuseKate Ariez Neilan, MD, PGY2 08/26/2017 12:29 PM

## 2017-08-26 NOTE — Assessment & Plan Note (Signed)
We are running out of options and both of us are disheartened by her pain. No red flags or new symptoms, but persistent pain limiting her quality of life. Will refer to PMR. Will also rx ibuprofen for PRN use as she has medication cost issues and this will be cheaper. We will not do narcotics for this problem.

## 2017-08-26 NOTE — Assessment & Plan Note (Signed)
Persistent symptoms (tearful, chronic pain, wants to lie in bed all day), exacerbated by family situation, wants to try paxil as this has helped family members. We will start with 20mg , follow up in 1 month. Counseled on black box warnings.

## 2017-10-26 ENCOUNTER — Other Ambulatory Visit: Payer: Self-pay | Admitting: Family Medicine

## 2017-10-26 DIAGNOSIS — M5416 Radiculopathy, lumbar region: Secondary | ICD-10-CM

## 2017-10-26 NOTE — Progress Notes (Signed)
Placing new referral for pain as [facility] refused referral. Hopeful that [facility] may be able to aid in pain with DO manipulation or other modalities.

## 2017-10-27 ENCOUNTER — Ambulatory Visit: Payer: Medicaid Other | Admitting: Family Medicine

## 2017-11-04 ENCOUNTER — Telehealth: Payer: Self-pay | Admitting: Family Medicine

## 2017-11-04 MED ORDER — PAROXETINE HCL 30 MG PO TABS: 30.0000 mg | ORAL_TABLET | Freq: Every day | ORAL | 1 refills | Status: DC

## 2017-11-04 NOTE — Telephone Encounter (Signed)
Called patient as I have tried two separate offices for referrals for her chronic pain. She is frustrated and says that she has 8-9 out of 10 back pain every day lately.  This disturbs her sleep as we previously discussed.  She does not have any new ideas about what we could do for her pain.  She is taking her Paxil every other day approximately.  She is willing to trial increasing the dose of this to see if it makes a difference.  We will increase dose to 30 mg and I scheduled her a follow-up appointment with me in 3 weeks.

## 2017-11-30 ENCOUNTER — Ambulatory Visit (INDEPENDENT_AMBULATORY_CARE_PROVIDER_SITE_OTHER): Payer: Medicaid Other | Admitting: Family Medicine

## 2017-11-30 ENCOUNTER — Encounter: Payer: Self-pay | Admitting: Family Medicine

## 2017-11-30 ENCOUNTER — Other Ambulatory Visit: Payer: Self-pay

## 2017-11-30 VITALS — BP 118/80 | HR 67 | Temp 98.5°F | Wt 221.0 lb

## 2017-11-30 DIAGNOSIS — F322 Major depressive disorder, single episode, severe without psychotic features: Secondary | ICD-10-CM

## 2017-11-30 DIAGNOSIS — Z981 Arthrodesis status: Secondary | ICD-10-CM | POA: Diagnosis not present

## 2017-11-30 NOTE — Patient Instructions (Addendum)
It was a pleasure to see you today! Thank you for choosing [facility] for your primary care. Carol Gross was seen for back pain, mood.   Our plans for today were:  Keep your paxil the same.   I placed a referral for a second opinion.   Think about getting your flu shot at some point before.   Please ask up front for the next available integrated care appointment for coping skills.    Best,  Dr. Chanetta Marshall

## 2017-11-30 NOTE — Progress Notes (Signed)
   CC: chronic back pain, depression  HPI  [facility] said they can't help her anymore with epidural injections because she missed one appointments. She can't afford the $50 for the missed appt. They offered a nerve stimulator. Last epidural injection was about 2 months ago. Wants a second opinion in back pain. Pain keeps her from being able to think to sit in class for 3 hours.   Feels paxil is helping some. PHQ9 of 9. GAD7 of 9. Pain seems to limit this the most. She wants to keep on the paxil. She is willing to try therapy.   ROS: Denies CP, SOB, abdominal pain, dysuria, changes in BMs.   CC, SH/smoking status, and VS noted  Objective: BP 118/80   Pulse 67   Temp 98.5 F (36.9 C) (Oral)   Wt 221 lb (100.2 kg)   SpO2 99%   BMI 34.61 kg/m  Gen: NAD, alert, cooperative, and pleasant. HEENT: NCAT, EOMI, PERRL CV: RRR, no murmur Resp: CTAB, no wheezes, non-labored Ext: No edema, warm Neuro: Alert and oriented, Speech clear, No gross deficits  Assessment and plan:  S/P lumbar spinal fusion Patient requests a second opinion regarding further options. I counseled her that there may be very limited options with her back pain and I am not sure that any remedy will completely resolve her pain. Placed second opinion at her request.   Depression, major Improved with paxil, continue this. Patient willing to try integrated care, will do that today.    Orders Placed This Encounter  Procedures  . Ambulatory referral to Orthopedics    Referral Priority:   Routine    Referral Type:   Consultation    Referral Reason:   Specialty Services Required    Requested Specialty:   Orthopedic Surgery    Number of Visits Requested:   1    No orders of the defined types were placed in this encounter.  Health Maintenance reviewed - patient declines flu shot, asked her to consider this and maybe get at next visit or [employer].  Loni Muse, MD, PGY3 11/30/2017 11:40 AM

## 2017-11-30 NOTE — Assessment & Plan Note (Signed)
Patient requests a second opinion regarding further options. I counseled her that there may be very limited options with her back pain and I am not sure that any remedy will completely resolve her pain. Placed second opinion at her request.

## 2017-11-30 NOTE — Assessment & Plan Note (Signed)
Improved with paxil, continue this. Patient willing to try integrated care, will do that today.

## 2017-12-09 ENCOUNTER — Ambulatory Visit: Payer: Medicaid Other

## 2017-12-23 ENCOUNTER — Ambulatory Visit: Payer: Medicaid Other | Admitting: Licensed Clinical Social Worker

## 2017-12-23 DIAGNOSIS — F321 Major depressive disorder, single episode, moderate: Secondary | ICD-10-CM

## 2017-12-23 NOTE — BH Specialist Note (Signed)
Integrated Behavioral Health Warm Handoff  MRN: [id number] Name: Carol Gross  Session Start time: 1:30  Session End time: 2:30 Total time: 1 hour  Type of Service: Integrated Behavioral Health   Warm Hand Off Completed.      Patient and/or legal guardian verbally consented to meet with [facility] Consultant about presenting concerns.  SUBJECTIVE: Carol Gross is a 37 y.o. female referred by Dr. Chanetta Marshall  for coping skills regarding her depression and anxiety symptoms.   Report of symptoms: severe back pain limiting quality of life and unable to do things for herself.   ASSESSMENT: Mood: Cheerful and Affect: Appropriate Patient is currently experiencing symptoms of moderate depression and anxiety which are exacerbated by her chronic pain in her back. Patient was seen at [facility] to get injections but was told she couldn't come back unless she paid 50$ due to her missing an appointment.Patient is willing to get the injections again but needs a new clinic that accepts her insurance since she is unable to pay 50 dollar fee. Patient also mentioned wanting to try a Chiropractor for her pain. [facility] intern will discuss this with provider. Patient is taking care of her children and stays very busy due to the sports that they are involved in. Patient states she doesn't have time to do anything for herself anymore and is always doing stuff for other people such as her children.  Patients GAD and PHQ-9 scores have increased since last visit on 10/2. Will assess again in two weeks.   Patient may benefit from and is in agreement to receive further assessment and brief therapeutic interventions to assist with managing chronic pain,and moderate symptoms of depression and anxiety.   PLAN / GOALS: Patient will: 1. Reduce symptoms of: anxiety, depression and stress 2. Increase knowledge and/or ability of: coping skills  3. Demonstrate ability to: Increase healthy  adjustment to current life circumstances, Improve medication compliance and Decrease self-medicating behaviors 4.   Follow up with behavioral health clinician: in two weeks for follow up session.  [facility] Intern Will:  1. Follow up with Dr. Chanetta Marshall to assess pain management and/or Chiropractor options. 2. Review PACING with client for chronic pain at next visit.  _______________________________________________________  Psychiatric History - Diagnoses:patient states she has no former or current MH diagnosis.  - Hospitalizations:  n/a - Pharmacotherapy: patient currently on Paxil. Previously prescribed cymbalta and Wellbutrin. Does not like these medications as she states they all give her nightmares and night sweats. Reports taking Paxil consistent for one month and has taken it here and there for the past two weeks. Patient was counseled on the Importance of medication adherance and patient agrees to take medication as prescribed and follow up in two weeks to see if Paxil is effective.  - Outpatient therapy: patient has seen a Veterinary surgeon. States she has only seen her a couple times that when she saw her she and law enforcement were unable to help her so she felt like "why bother". Will assess further at follow up.   Family history of psychiatric issues:N/A  Current and history of substance use: patient drinks about once a week socially. 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  Risk of harm to self or others: No plan to harm self or others  LIFE CONTEXT: Family and Social: patient has a children ages 22, 6 and 76. Patient is also taking care of her nephew who is 7. Patient states her eldest daughter is living with her father  who recently got out of prison. Daughter, father and step mother have been living in a hotel. Patient states her husband has 5 kids but they don't live with them.   School/Work: patient has been trying to get disabilty for her back for the past 6 years but states someone  who did her taxes, who was not a Herbalist, set her back by filing something on her taxes that was untrue. Patient has been trying to get her GED but hasn't had the time to get her hours in for her classes. Self-Care: patient reports liking to drink with her friends during "girls day" but reports its been about 6 months since this has happened.   INTERVENTION:  Motivational Interviewing, Supportive Counseling, Medication Monitoring and Psychoeducation and/or Health Education,  Consult MD   PHQ 9=12,indication of : moderate depression.  GAD-7=11,indication of : moderate anxiety.      GAD 7 : Generalized Anxiety Score 12/23/2017 11/30/2017 04/05/2016 01/09/2016  Nervous, Anxious, on Edge 2 1 0 3  Control/stop worrying 1 1 1 3   Worry too much - different things 1 1 1 3   Trouble relaxing 3 3 1 2   Restless 1 1 2  0  Easily annoyed or irritable 2 1 0 2  Afraid - awful might happen 1 1 0 2  Total GAD 7 Score 11 9 5 15   Anxiety Difficulty Somewhat difficult Not difficult at all - -   Depression screen [facility] 2/9 12/23/2017 11/30/2017 08/24/2017 06/07/2017 10/06/2016  Decreased Interest 1 1 0 2 1  Down, Depressed, Hopeless 2 1 0 2 1  PHQ - 2 Score 3 2 0 4 2  Altered sleeping 2 2 - - 2  Tired, decreased energy 2 1 - - 2  Change in appetite 1 2 - - 2  Feeling bad or failure about yourself  1 1 - - 1  Trouble concentrating 2 1 - - 0  Moving slowly or fidgety/restless 1 0 - - 0  Suicidal thoughts 0 0 - - 0  PHQ-9 Score 12 9 - - 9  Difficult doing work/chores - Not difficult at all - - -  Some recent data might be hidden     Nilsa Nutting, SW intern Behavioral Health Clinician,  [facility] [phone]

## 2017-12-27 ENCOUNTER — Other Ambulatory Visit: Payer: Self-pay | Admitting: Family Medicine

## 2017-12-27 NOTE — BH Specialist Note (Signed)
  I reviewed the Social Intern's note. I agree with the assessment and plan.  Sammuel Hines, LCSW Behavioral Health Clinician [facility]   [phone] 9:00 AM

## 2018-01-06 ENCOUNTER — Ambulatory Visit (INDEPENDENT_AMBULATORY_CARE_PROVIDER_SITE_OTHER): Payer: Medicaid Other | Admitting: Licensed Clinical Social Worker

## 2018-01-06 DIAGNOSIS — F321 Major depressive disorder, single episode, moderate: Secondary | ICD-10-CM | POA: Diagnosis not present

## 2018-01-06 NOTE — BH Specialist Note (Signed)
Integrated Behavioral Health Follow Up Visit  MRN: [id number] Name: Carol Gross  Number of Integrated Behavioral Health Clinician visits: 2/6 Session Start time: 11:30  Session End time: 12:00 Total time: 30 minutes  Reason for follow-up: Continue brief intervention to assist patient with managing symptoms of anxiety and depression, as well as managing stressors .  Report of symptoms: pain ; worry, trouble relaxing, difficult with sleeping, somewhat difficulty in daily functions. Often feels numb or not really having feelings since taking the medication.   ASSESSMENT:  Affect: Appropriate;Thought process: Coherent;  No plan to harm self or others Patient is pleasant and engaged in conversation. She reports adherence with Paxil for the past two weeks.  PHQ-9 & GAD scores are slightly higher today. Per patient this is primarily due to the death of her uncle this week. Is open to all options with managing her pain and symptoms of depression. She may benefit from, and is in agreement to continue ongoing assessment and brief therapeutic interventions to assist with managing her symptoms.  GOALS:Patient will: 1. Reduce symptoms of: anxiety, depression and insomnia 2. Increase knowledge and/or ability of: coping skills, self-management skills and stress reduction  3.   Manage pain PLAN:  1.Patient will F/U with [facility] in 2 weeks as needed 2.Behavioral recommendations: relaxed breathing 3 times daily 3. Start using her 10's unit when she gets new sticky pads 4. Start PACING  Intervention:  Reflective listening, Behavioral Therapy (Relaxed breathing); Problem-solving teaching/coping strategies, Psychoeducation and Supportive Counseling  GAD 7 : Generalized Anxiety Score 01/06/2018 12/23/2017 11/30/2017 04/05/2016  Nervous, Anxious, on Edge 2 2 1  0  Control/stop worrying 3 1 1 1   Worry too much - different things 2 1 1 1   Trouble relaxing 3 3 3 1   Restless 1 1 1 2   Easily annoyed or irritable 2 2 1   0  Afraid - awful might happen 2 1 1  0  Total GAD 7 Score 15 11 9 5   Anxiety Difficulty Somewhat difficult Somewhat difficult Not difficult at all -   Depression screen [facility] 2/9 01/06/2018 12/23/2017 11/30/2017  Decreased Interest 3 1 1   Down, Depressed, Hopeless 1 2 1   PHQ - 2 Score 4 3 2   Altered sleeping 2 2 2   Tired, decreased energy 2 2 1   Change in appetite 2 1 2   Feeling bad or failure about yourself  1 1 1   Trouble concentrating 1 2 1   Moving slowly or fidgety/restless 1 1 0  Suicidal thoughts 0 0 0  PHQ-9 Score 13 12 9   Difficult doing work/chores Somewhat difficult - Not difficult at all  Some recent data might be hidden    Sammuel Hines, LCSW Behavioral Health Clinician [facility]   [phone] 12:09 PM

## 2018-01-06 NOTE — Patient Instructions (Addendum)
It was a pleasure seeing you today.  Please review the material I have given you on relaxed breathing and PACING.  Today we discussed your goals and various options to assist you with accomplishing them.    1. Follow up with me or Darrian as needed       2. Relaxed breathing 3 times daily       3. PACING        4. Work on getting sticky pads for 10's unit

## 2018-02-01 ENCOUNTER — Ambulatory Visit (INDEPENDENT_AMBULATORY_CARE_PROVIDER_SITE_OTHER): Payer: Medicaid Other | Admitting: Family Medicine

## 2018-02-01 ENCOUNTER — Other Ambulatory Visit: Payer: Self-pay

## 2018-02-01 VITALS — BP 126/78 | HR 72 | Temp 98.0°F | Ht 67.0 in | Wt 227.8 lb

## 2018-02-01 DIAGNOSIS — M5416 Radiculopathy, lumbar region: Secondary | ICD-10-CM

## 2018-02-01 DIAGNOSIS — F321 Major depressive disorder, single episode, moderate: Secondary | ICD-10-CM | POA: Diagnosis not present

## 2018-02-01 MED ORDER — GABAPENTIN 100 MG PO CAPS: 100.0000 mg | ORAL_CAPSULE | Freq: Every day | ORAL | 0 refills | Status: DC

## 2018-02-01 MED ORDER — IBUPROFEN 200 MG PO TABS: 400.0000 mg | ORAL_TABLET | Freq: Four times a day (QID) | ORAL | 0 refills | Status: DC | PRN

## 2018-02-01 NOTE — Progress Notes (Signed)
   CC: back pain   HPI Mood - still taking paxil. Feels this is helpful.   Grief - lost her uncle 2 weeks ago. Says she is "tryign not to think about it."   Back pain - taking 500mg  acetaminophin and works some. Still working on getting her records together for [facility]. This tends to disturb her sleep. Missing class to be here today. She says she is wincing in class both walkign around and sitting still. Tends to stay in her bed most of the day. Unsure about the brand of her TENS unit, hasn't been able to get new pads for this ([facility] Feels like her legs are staying to feel weak. No new injuries. Feels like is now hurting up into her midback. Now moved and has 4 steps instead of previously 15. Reports gabapentin previously made her sleepy, took it during the day. Last PT was after her surgery, will to try again.   ROS: Denies CP, SOB, abdominal pain, dysuria, changes in BMs.   CC, SH/smoking status, and VS noted  Objective: BP 126/78   Pulse 72   Temp 98 F (36.7 C) (Oral)   Ht 5\' 7"  (1.702 m)   Wt 227 lb 12.8 oz (103.3 kg)   LMP 01/20/2018 (Approximate)   SpO2 99%   BMI 35.68 kg/m  Gen: NAD, alert, cooperative, and pleasant. HEENT: NCAT, EOMI, PERRL CV: RRR, no murmur Resp: CTAB, no wheezes, non-labored Abd: SNTND, BS present, no guarding or organomegaly MSK: TTP over bilateral trapezius muscles, no cervical spinous process tenderness, no midback tenderness, TTP over lumbar spine and paraspinal muscles, +Straight leg raise, pain with getting up on the table.  Neuro: Alert and oriented, Speech clear, No gross deficits  Assessment and plan:  Lumbar radiculopathy, chronic Doubt any new pathology based on exam. No XR at present. Will try PT again, hopefully they can also help her with TENS unit supplies. Start gabapentin 300mg  QHS and recheck in one month. Consider TCA in the future. Also encouraged patient to get her ortho paperwork together.   Depression,  major Continue paxil. Suspect closely related to her chronic back pain.    Orders Placed This Encounter  Procedures  . Ambulatory referral to Physical Therapy    Referral Priority:   Routine    Referral Type:   Physical Medicine    Referral Reason:   Specialty Services Required    Requested Specialty:   Physical Therapy    Number of Visits Requested:   1    Meds ordered this encounter  Medications  . gabapentin (NEURONTIN) 100 MG capsule    Sig: Take 1 capsule (100 mg total) by mouth at bedtime.    Dispense:  90 capsule    Refill:  0  . ibuprofen (ADVIL,MOTRIN) 200 MG tablet    Sig: Take 2 tablets (400 mg total) by mouth every 6 (six) hours as needed for moderate pain.    Dispense:  30 tablet    Refill:  0    Loni MuseKate Huber Mathers, MD, PGY3 02/01/2018 3:47 PM

## 2018-02-01 NOTE — Patient Instructions (Addendum)
It was a pleasure to see you today! Thank you for choosing [facility] for your primary care. Carol Gross was seen for back pain.   Our plans for today were:  Oswaldo DoneKeep taking your paxil.   [facility]  Start the gabapentin 300mg  at night.   Best,  Dr. Chanetta Marshallimberlake

## 2018-02-01 NOTE — Assessment & Plan Note (Signed)
Doubt any new pathology based on exam. No XR at present. Will try PT again, hopefully they can also help her with TENS unit supplies. Start gabapentin 300mg  QHS and recheck in one month. Consider TCA in the future. Also encouraged patient to get her ortho paperwork together.

## 2018-02-01 NOTE — Assessment & Plan Note (Signed)
Continue paxil. Suspect closely related to her chronic back pain.

## 2018-02-14 ENCOUNTER — Other Ambulatory Visit: Payer: Self-pay

## 2018-02-14 ENCOUNTER — Ambulatory Visit: Payer: Medicaid Other | Attending: Family Medicine

## 2018-02-14 DIAGNOSIS — R293 Abnormal posture: Secondary | ICD-10-CM | POA: Diagnosis not present

## 2018-02-14 DIAGNOSIS — M62830 Muscle spasm of back: Secondary | ICD-10-CM | POA: Diagnosis not present

## 2018-02-14 DIAGNOSIS — M6281 Muscle weakness (generalized): Secondary | ICD-10-CM | POA: Insufficient documentation

## 2018-02-14 DIAGNOSIS — M5442 Lumbago with sciatica, left side: Secondary | ICD-10-CM | POA: Diagnosis not present

## 2018-02-14 DIAGNOSIS — G8929 Other chronic pain: Secondary | ICD-10-CM | POA: Insufficient documentation

## 2018-02-14 NOTE — Therapy (Signed)
[facility] [address] [place], [place], [address] Phone: [phone]   Fax:  [phone]  Physical Therapy Evaluation  Patient Details  Name: TANAIA HAWKEY MRN: [id number] Date of Birth: [birth year] Referring Provider (PT): Launa Flight, MD   Encounter Date: 02/14/2018  PT End of Session - 02/14/18 1548    Visit Number  1    Number of Visits  12    Date for PT Re-Evaluation  03/31/18    Authorization Type  MCD    PT Start Time  0308   pt late   PT Stop Time  0345    PT Time Calculation (min)  37 min    Activity Tolerance  No increased pain;Patient limited by pain    Behavior During Therapy  Anxious       Past Medical History:  Diagnosis Date  . Arthritis   . Back pain    sees ortho  . Carpal tunnel syndrome   . Chronic back pain   . Depression   . Fibromyalgia    ?  Marland Kitchen Headache(784.0)   . Hypertension    previously on. d/c'd by dr over month ago  . Shortness of breath    exertion    Past Surgical History:  Procedure Laterality Date  . ABDOMINAL EXPOSURE N/A 06/25/2015   Procedure: ABDOMINAL EXPOSURE;  Surgeon: Larina Earthly, MD;  Location: [facility];  Service: Vascular;  Laterality: N/A;  . ANTERIOR LUMBAR FUSION N/A 06/25/2015   Procedure: ANTERIOR LUMBAR INTERBODY  FUSION LUMBAR FIVE -SACRAL ONE;  Surgeon: Tia Alert, MD;  Location: [facility];  Service: Neurosurgery;  Laterality: N/A;  . LAMINECTOMY WITH POSTERIOR LATERAL ARTHRODESIS LEVEL 1 N/A 11/03/2016   Procedure: Posterior Lateral Fusion - Lumbar five-Sacrum one  with pedicle screw fixation;  Surgeon: Tia Alert, MD;  [facility];  Service: Neurosurgery;  Laterality: N/A;  . LUMBAR LAMINECTOMY/DECOMPRESSION MICRODISCECTOMY  03/03/2012   Procedure: LUMBAR LAMINECTOMY/DECOMPRESSION MICRODISCECTOMY 1 LEVEL;  Surgeon: Karn Cassis, MD;  Location: [facility];  Service: Neurosurgery;  Laterality: Left;  Left Lumbar five sacral one  Diskectomy  . TUBAL LIGATION    . WISDOM TOOTH EXTRACTION      There were no vitals filed for this visit.   Subjective Assessment - 02/14/18 1514    Subjective  She reports onset of back pain    She has had 3 surgeries.   Lt side pain and LT foot numbness.  Has not been back to surgeon.   PAin management with injections with minimal benefit.   She is not sure why has pain.   Synotoims fluctuate .     Pertinent History  3 back surgeries.     Limitations  --   everything   How long can you sit comfortably?  60 min    How long can you stand comfortably?  short periods    How long can you walk comfortably?  200 feet     Diagnostic tests  xrays     Patient Stated Goals  She wants to derease pain. and be comfortable.     Currently in Pain?  Yes    Pain Score  8     Pain Location  Back    Pain Orientation  Lower;Left;Right   Lt most   Pain Descriptors / Indicators  Tightness;Throbbing;Numbness   pulling   Pain Type  Chronic pain    Pain Radiating Towards  LT leg  Pain Onset  More than a month ago    Pain Frequency  Constant    Aggravating Factors   Everything    Pain Relieving Factors  hot baths , heat , TENS,  meds (not helpful)         [facility] PT Assessment - 02/14/18 0001      Assessment   Medical Diagnosis  Chronic lumbar radiculopathy    Referring Provider (PT)  Launa Flight, MD    Onset Date/Surgical Date  --   6 years ago.    Next MD Visit  As needed    Prior Therapy  PT in past 1x.        Precautions   Precautions  None      Restrictions   Weight Bearing Restrictions  No      Balance Screen   Has the patient fallen in the past 6 months  Yes    How many times?  1   fell in driveway . not sure why   Has the patient had a decrease in activity level because of a fear of falling?   No    Is the patient reluctant to leave their home because of a fear of falling?   No      Prior Function   Level of Independence  Needs assistance with homemaking;Needs  assistance with ADLs    Vocation  Unemployed      Cognition   Overall Cognitive Status  Within Functional Limits for tasks assessed    Behaviors  Lability   she cryed when we discussed chronicity and frustration of no     Posture/Postural Control   Posture Comments  flat T/S and Lumbar spine, valgus bilateral knees RT shoulder higher than LT       ROM / Strength   AROM / PROM / Strength  AROM;Strength;PROM      AROM   AROM Assessment Site  Lumbar    Lumbar Flexion  25    Lumbar Extension  10    Lumbar - Right Side Bend  10    Lumbar - Left Side Bend  10      PROM   Overall PROM Comments  Hip rotation ;limited , hip flexion limited , SLR limited , LTR limited  bilaterally      Strength   Overall Strength Comments  LE WNL with verbal encouragement for Lt leg but with pain testing Lt leg.  Poor abdominal strength      Flexibility   Soft Tissue Assessment /Muscle Length  yes    Hamstrings  60 bilaterally      Palpation   Palpation comment  everything in pelvis appears level and even.   Tender LT lower back into gluteals.      Ambulation/Gait   Gait Comments  WFL , no device                Objective measurements completed on examination: See above findings.              PT Education - 02/14/18 1521    Education Details  POC, Initiated pain neuroscience education    Person(s) Educated  Patient    Methods  Explanation    Comprehension  Verbalized understanding       PT Short Term Goals - 02/14/18 1555      PT SHORT TERM GOAL #1   Title  She will be independent with initial HEP    Baseline  no program  Time  3    Period  Weeks    Status  New      PT SHORT TERM GOAL #2   Title  She will report 10 % improvement in pain overall    Baseline  8-10/10 pain leves at eval    Time  3    Period  Weeks    Status  New        PT Long Term Goals - 02/14/18 1556      PT LONG TERM GOAL #1   Title  She will be independent with all HEP issued     Baseline  She is independent with all HEP issued    Time  6    Period  Weeks    Status  New      PT LONG TERM GOAL #2   Title  She will report pain overall decr 30% or more with activity    Baseline  8-10 pain at eval   10% decr     Time  6    Period  Weeks    Status  New      PT LONG TERM GOAL #3   Title  She will be able to bend to 50 degrees with only mod pain incr    Baseline  severe pain with forward flexion    Time  6    Period  Weeks    Status  New      PT LONG TERM GOAL #4   Title  She will report able to sit with support for 45-60 min without incr pain    Baseline  has to move during sitting more than 30 min    Time  6    Period  Weeks    Status  New      PT LONG TERM GOAL #5   Title  She will report LT leg symproms decr 30 % or more    Baseline  constant LT leg pain and numb foot    Time  6    Period  Weeks    Status  New             Plan - 02/14/18 1549    Clinical Impression Statement  Ms Oswaldo DoneVincent presents with chronic LBP with left leg symptoms  of years duration with Hx 3 spinal surgeries that appear to be stable. She demo stiffness of both hips and lower spine and abdominal weakness . She appears to be a candidate for pain neuroscience education and may respond to skilled PT with some manual therapy and a HEP.    History and Personal Factors relevant to plan of care:  3 spinal surgeries.  days unable to walk, emotionally labile,  obesity    Clinical Presentation  Unstable    Clinical Presentation due to:  chronic LBP and LT leg symptoms.     Clinical Decision Making  Moderate    Rehab Potential  Fair    PT Frequency  --   3 visits   PT Duration  --   over 2-3 weeks then 2x/week for 4 weeks   PT Treatment/Interventions  Dry needling;Passive range of motion;Manual techniques;Patient/family education;Therapeutic exercise;Therapeutic activities;Moist Heat;Electrical Stimulation    PT Next Visit Plan  Initiate HEP, manual , modalities    Consulted and  Agree with Plan of Care  Patient       Patient will benefit from skilled therapeutic intervention in order to improve the following deficits and impairments:  Pain, Postural  dysfunction, Decreased strength, Decreased activity tolerance, Decreased range of motion, Increased muscle spasms, Difficulty walking, Decreased mobility  Visit Diagnosis: Abnormal posture  Muscle spasm of back  Chronic bilateral low back pain with left-sided sciatica  Muscle weakness (generalized)     Problem List Patient Active Problem List   Diagnosis Date Noted  . Suspected exposure to mold 11/16/2016  . Obesity, morbid (HCC) 10/07/2016  . S/P lumbar spinal fusion 06/25/2015  . Pain in joint, shoulder region 07/23/2014  . Menorrhagia with irregular cycle 07/03/2014  . Lumbar radiculopathy, chronic 12/05/2013  . Cocaine abuse (HCC) 09/15/2012  . Tobacco abuse 05/03/2011  . Obesity (BMI 30-39.9) 09/04/2009  . CARPAL TUNNEL SYNDROME, BILATERAL 09/04/2009  . Chronic low back pain 04/09/2009  . Depression, major 11/06/2008    Caprice Red  PT 02/14/2018, 4:02 PM  [facility] [address] [place], [place], [address] Phone: [phone]   Fax:  [phone]  Name: RICA HEATHER MRN: [id number] Date of Birth: 10/03/1980

## 2018-02-27 ENCOUNTER — Telehealth: Payer: Self-pay

## 2018-02-27 NOTE — Telephone Encounter (Signed)
Patient in nurse clinic with daughter. Still in need of pads for TENS unit. She will call me when she gets home with the make, model of TENS unit so the correct pads can be ordered. Ples SpecterAlisa Damira Kem, RN [facility] RN)

## 2018-03-06 ENCOUNTER — Ambulatory Visit: Payer: Medicaid Other | Attending: Family Medicine | Admitting: Physical Therapy

## 2018-03-06 ENCOUNTER — Encounter: Payer: Self-pay | Admitting: Physical Therapy

## 2018-03-06 DIAGNOSIS — M6281 Muscle weakness (generalized): Secondary | ICD-10-CM | POA: Diagnosis not present

## 2018-03-06 DIAGNOSIS — M62830 Muscle spasm of back: Secondary | ICD-10-CM

## 2018-03-06 DIAGNOSIS — R293 Abnormal posture: Secondary | ICD-10-CM

## 2018-03-06 DIAGNOSIS — G8929 Other chronic pain: Secondary | ICD-10-CM | POA: Diagnosis not present

## 2018-03-06 DIAGNOSIS — M5442 Lumbago with sciatica, left side: Secondary | ICD-10-CM | POA: Insufficient documentation

## 2018-03-07 NOTE — Therapy (Signed)
[facility] [address] [place], [place], [address] Phone: [phone]   Fax:  [phone]  Physical Therapy Treatment  Patient Details  Name: Carol Gross MRN: [id number] Date of Birth: 01-14-1981 Referring Provider (PT): Launa FlightKatheryn Timberlake, MD   Encounter Date: 03/06/2018  PT End of Session - 03/06/18 1239    Visit Number  2    Number of Visits  12    Date for PT Re-Evaluation  03/31/18    Authorization Type  MCD    PT Start Time  0945   15 min late for appointment    PT Stop Time  1028    PT Time Calculation (min)  43 min    Activity Tolerance  Patient tolerated treatment well    Behavior During Therapy  [facility] for tasks assessed/performed       Past Medical History:  Diagnosis Date  . Arthritis   . Back pain    sees ortho  . Carpal tunnel syndrome   . Chronic back pain   . Depression   . Fibromyalgia    ?  Marland Kitchen. Headache(784.0)   . Hypertension    previously on. d/c'd by dr over month ago  . Shortness of breath    exertion    Past Surgical History:  Procedure Laterality Date  . ABDOMINAL EXPOSURE N/A 06/25/2015   Procedure: ABDOMINAL EXPOSURE;  Surgeon: Larina Earthlyodd F Early, MD;  Location: [facility];  Service: Vascular;  Laterality: N/A;  . ANTERIOR LUMBAR FUSION N/A 06/25/2015   Procedure: ANTERIOR LUMBAR INTERBODY  FUSION LUMBAR FIVE -SACRAL ONE;  Surgeon: Tia Alertavid S Jones, MD;  Location: [facility];  Service: Neurosurgery;  Laterality: N/A;  . LAMINECTOMY WITH POSTERIOR LATERAL ARTHRODESIS LEVEL 1 N/A 11/03/2016   Procedure: Posterior Lateral Fusion - Lumbar five-Sacrum one  with pedicle screw fixation;  Surgeon: Tia AlertJones, Krist Rosenboom S, MD;  [facility];  Service: Neurosurgery;  Laterality: N/A;  . LUMBAR LAMINECTOMY/DECOMPRESSION MICRODISCECTOMY  03/03/2012   Procedure: LUMBAR LAMINECTOMY/DECOMPRESSION MICRODISCECTOMY 1 LEVEL;  Surgeon: Karn CassisErnesto M Botero, MD;  Location: [facility];  Service: Neurosurgery;  Laterality:  Left;  Left Lumbar five sacral one Diskectomy  . TUBAL LIGATION    . WISDOM TOOTH EXTRACTION      There were no vitals filed for this visit.  Subjective Assessment - 03/06/18 0949    Subjective  Patient reports continued pain in her lower back. her pain level today is about an 8/10. She is still having pain with most activity. She wasnt given any exercises the last visit.     Pertinent History  3 back surgeries.     How long can you sit comfortably?  60 min    How long can you stand comfortably?  short periods    How long can you walk comfortably?  200 feet     Diagnostic tests  xrays     Patient Stated Goals  She wants to derease pain. and be comfortable.     Currently in Pain?  Yes    Pain Score  8     Pain Location  Back    Pain Orientation  Right;Left;Lower    Pain Descriptors / Indicators  Tightness;Throbbing;Numbness    Pain Type  Chronic pain    Pain Onset  More than a month ago    Pain Frequency  Constant    Aggravating Factors   everything     Pain Relieving Factors  hot baths  [facility] Adult PT Treatment/Exercise - 03/07/18 0001      Exercises   Exercises  Lumbar      Lumbar Exercises: Stretches   Active Hamstring Stretch Limitations  3x20 sec hold     Piriformis Stretch Limitations  seated with towel 2x20 sec hold mod cuing for technique       Lumbar Exercises: Seated   Other Seated Lumbar Exercises  ball squeeze 2x5 with abdominal breathing; reviewed abdominal breathing and cuing for posture; hip abduction 2x5 yellow with moderate cuing.       Modalities   Modalities  Electrical Stimulation;Moist Heat      Moist Heat Therapy   Moist Heat Location  Lumbar Spine      Electrical Stimulation   Electrical Stimulation Location  left lumbar spine in side lying     Electrical Stimulation Action  IFC     Electrical Stimulation Parameters  to tolerance     Electrical Stimulation Goals  Pain             PT Education -  03/06/18 1239    Education Details  reviewed HEP and symptom management     Person(s) Educated  Patient    Methods  Explanation;Demonstration;Tactile cues;Verbal cues    Comprehension  Verbalized understanding;Returned demonstration;Verbal cues required;Tactile cues required       PT Short Term Goals - 02/14/18 1555      PT SHORT TERM GOAL #1   Title  She will be independent with initial HEP    Baseline  no program    Time  3    Period  Weeks    Status  New      PT SHORT TERM GOAL #2   Title  She will report 10 % improvement in pain overall    Baseline  8-10/10 pain leves at eval    Time  3    Period  Weeks    Status  New        PT Long Term Goals - 02/14/18 1556      PT LONG TERM GOAL #1   Title  She will be independent with all HEP issued    Baseline  She is independent with all HEP issued    Time  6    Period  Weeks    Status  New      PT LONG TERM GOAL #2   Title  She will report pain overall decr 30% or more with activity    Baseline  8-10 pain at eval   10% decr     Time  6    Period  Weeks    Status  New      PT LONG TERM GOAL #3   Title  She will be able to bend to 50 degrees with only mod pain incr    Baseline  severe pain with forward flexion    Time  6    Period  Weeks    Status  New      PT LONG TERM GOAL #4   Title  She will report able to sit with support for 45-60 min without incr pain    Baseline  has to move during sitting more than 30 min    Time  6    Period  Weeks    Status  New      PT LONG TERM GOAL #5   Title  She will report LT leg symproms decr 30 % or more  Baseline  constant LT leg pain and numb foot    Time  6    Period  Weeks    Status  New            Plan - 03/07/18 1704    Clinical Impression Statement  Patient was limited by pain but she was able to complete some activity. she was unable to lie supine. The patients visit was limited by being 15 minutes late.     Clinical Decision Making  Moderate    PT  Frequency  1x / week    PT Duration  3 weeks    PT Treatment/Interventions  Dry needling;Passive range of motion;Manual techniques;Patient/family education;Therapeutic exercise;Therapeutic activities;Moist Heat;Electrical Stimulation    PT Next Visit Plan  Initiate HEP, manual , modalities    Consulted and Agree with Plan of Care  Patient       Patient will benefit from skilled therapeutic intervention in order to improve the following deficits and impairments:  Pain, Postural dysfunction, Decreased strength, Decreased activity tolerance, Decreased range of motion, Increased muscle spasms, Difficulty walking, Decreased mobility  Visit Diagnosis: Abnormal posture  Muscle spasm of back  Chronic bilateral low back pain with left-sided sciatica  Muscle weakness (generalized)     Problem List Patient Active Problem List   Diagnosis Date Noted  . Suspected exposure to mold 11/16/2016  . Obesity, morbid (HCC) 10/07/2016  . S/P lumbar spinal fusion 06/25/2015  . Pain in joint, shoulder region 07/23/2014  . Menorrhagia with irregular cycle 07/03/2014  . Lumbar radiculopathy, chronic 12/05/2013  . Cocaine abuse (HCC) 09/15/2012  . Tobacco abuse 05/03/2011  . Obesity (BMI 30-39.9) 09/04/2009  . CARPAL TUNNEL SYNDROME, BILATERAL 09/04/2009  . Chronic low back pain 04/09/2009  . Depression, major 11/06/2008    Dessie Comaavid J Akaysha Cobern PT DPT  03/07/2018, 5:28 PM  [facility] [address] [place], [place], [address] Phone: [phone]   Fax:  [phone]  Name: Carol InaRaquel L Presswood MRN: [id number] Date of Birth: [birth year]

## 2018-03-13 ENCOUNTER — Encounter: Payer: Medicaid Other | Admitting: Physical Therapy

## 2018-03-14 ENCOUNTER — Ambulatory Visit: Payer: Medicaid Other | Admitting: Physical Therapy

## 2018-03-14 ENCOUNTER — Encounter: Payer: Self-pay | Admitting: Physical Therapy

## 2018-03-14 DIAGNOSIS — G8929 Other chronic pain: Secondary | ICD-10-CM

## 2018-03-14 DIAGNOSIS — M6281 Muscle weakness (generalized): Secondary | ICD-10-CM

## 2018-03-14 DIAGNOSIS — M62830 Muscle spasm of back: Secondary | ICD-10-CM | POA: Diagnosis not present

## 2018-03-14 DIAGNOSIS — M5442 Lumbago with sciatica, left side: Secondary | ICD-10-CM

## 2018-03-14 DIAGNOSIS — R293 Abnormal posture: Secondary | ICD-10-CM

## 2018-03-14 NOTE — Therapy (Signed)
[facility] [address] [place], [place], [address] Phone: [phone]   Fax:  [phone]  Physical Therapy Treatment  Patient Details  Name: Carol Gross MRN: [id number] Date of Birth: 1980-06-26 Referring Provider (PT): Launa Flight, MD   Encounter Date: 03/14/2018  PT End of Session - 03/14/18 1204    Visit Number  3    Number of Visits  12    Date for PT Re-Evaluation  03/31/18    Authorization Type  MCD    PT Start Time  1145    PT Stop Time  1235    PT Time Calculation (min)  50 min    Activity Tolerance  Patient tolerated treatment well    Behavior During Therapy  [facility] for tasks assessed/performed       Past Medical History:  Diagnosis Date  . Arthritis   . Back pain    sees ortho  . Carpal tunnel syndrome   . Chronic back pain   . Depression   . Fibromyalgia    ?  Marland Kitchen Headache(784.0)   . Hypertension    previously on. d/c'd by dr over month ago  . Shortness of breath    exertion    Past Surgical History:  Procedure Laterality Date  . ABDOMINAL EXPOSURE N/A 06/25/2015   Procedure: ABDOMINAL EXPOSURE;  Surgeon: Larina Earthly, MD;  Location: [facility];  Service: Vascular;  Laterality: N/A;  . ANTERIOR LUMBAR FUSION N/A 06/25/2015   Procedure: ANTERIOR LUMBAR INTERBODY  FUSION LUMBAR FIVE -SACRAL ONE;  Surgeon: Tia Alert, MD;  Location: [facility];  Service: Neurosurgery;  Laterality: N/A;  . LAMINECTOMY WITH POSTERIOR LATERAL ARTHRODESIS LEVEL 1 N/A 11/03/2016   Procedure: Posterior Lateral Fusion - Lumbar five-Sacrum one  with pedicle screw fixation;  Surgeon: Tia Alert, MD;  Location[facility];  Service: Neurosurgery;  Laterality: N/A;  . LUMBAR LAMINECTOMY/DECOMPRESSION MICRODISCECTOMY  03/03/2012   Procedure: LUMBAR LAMINECTOMY/DECOMPRESSION MICRODISCECTOMY 1 LEVEL;  Surgeon: Karn Cassis, MD;  Location: [facility];  Service: Neurosurgery;  Laterality: Left;  Left Lumbar five sacral  one Diskectomy  . TUBAL LIGATION    . WISDOM TOOTH EXTRACTION      There were no vitals filed for this visit.  Subjective Assessment - 03/14/18 1150    Subjective  Patient reports that overall her painhas improved but she woke up this morning sore. Most of her pain is on the left side.     Pertinent History  3 back surgeries.     How long can you sit comfortably?  60 min    How long can you stand comfortably?  short periods    How long can you walk comfortably?  200 feet     Diagnostic tests  xrays     Patient Stated Goals  She wants to derease pain. and be comfortable.     Currently in Pain?  Yes    Pain Score  7     Pain Location  Back    Pain Orientation  Right;Left;Lower    Pain Descriptors / Indicators  Aching    Pain Type  Chronic pain    Pain Onset  More than a month ago    Pain Frequency  Constant    Aggravating Factors   standing and walking     Pain Relieving Factors  rest  [facility] Adult PT Treatment/Exercise - 03/14/18 0001      Self-Care   Self-Care  Other Self-Care Comments    Other Self-Care Comments   reviewed thera-cane and tennis ball trigger point release.       Lumbar Exercises: Stretches   Active Hamstring Stretch Limitations  3x20 sec hold     Piriformis Stretch Limitations  seated with towel 2x20 sec hold mod cuing for technique       Lumbar Exercises: Seated   Other Seated Lumbar Exercises  hip abduction 2x10; ball squeeze 2x10; physico ball roll x10; lateral roll 2x5; ball press 2x10;     Other Seated Lumbar Exercises  bilater ER 2x10 yellow; bilateral shoulder abduction yellow x10      Electrical Stimulation   Electrical Stimulation Location  left lumbar spine in side lying     Electrical Stimulation Action  IFC     Electrical Stimulation Parameters  to tolerance     Electrical Stimulation Goals  Pain             PT Education - 03/14/18 1203    Education Details  reviewed HEP, symptom management      Person(s) Educated  Patient    Methods  Demonstration;Explanation;Tactile cues;Verbal cues    Comprehension  Verbalized understanding;Returned demonstration;Verbal cues required;Tactile cues required;Need further instruction       PT Short Term Goals - 03/14/18 1642      PT SHORT TERM GOAL #1   Title  She will be independent with initial HEP    Baseline  perfroming her current program     Period  Weeks    Status  On-going      PT SHORT TERM GOAL #2   Title  She will report 10 % improvement in pain overall    Baseline  8-10/10 pain leves at eval    Time  3    Period  Weeks    Status  On-going        PT Long Term Goals - 02/14/18 1556      PT LONG TERM GOAL #1   Title  She will be independent with all HEP issued    Baseline  She is independent with all HEP issued    Time  6    Period  Weeks    Status  New      PT LONG TERM GOAL #2   Title  She will report pain overall decr 30% or more with activity    Baseline  8-10 pain at eval   10% decr     Time  6    Period  Weeks    Status  New      PT LONG TERM GOAL #3   Title  She will be able to bend to 50 degrees with only mod pain incr    Baseline  severe pain with forward flexion    Time  6    Period  Weeks    Status  New      PT LONG TERM GOAL #4   Title  She will report able to sit with support for 45-60 min without incr pain    Baseline  has to move during sitting more than 30 min    Time  6    Period  Weeks    Status  New      PT LONG TERM GOAL #5   Title  She will report LT leg symproms decr 30 % or more  Baseline  constant LT leg pain and numb foot    Time  6    Period  Weeks    Status  New            Plan - 03/14/18 1545    Clinical Impression Statement  Patient was able to tolerate her exercises. She reports her shoulder has been hurting her. She has a large trigger point in her upper trap, superior andgle of her scapula. She was given postural exercises in sitting and given a tennis ball for  trigger point release.     Clinical Presentation  Unstable    Clinical Decision Making  Moderate    Rehab Potential  Fair    PT Frequency  1x / week    PT Duration  3 weeks    PT Treatment/Interventions  Dry needling;Passive range of motion;Manual techniques;Patient/family education;Therapeutic exercise;Therapeutic activities;Moist Heat;Electrical Stimulation    PT Next Visit Plan  Initiate HEP, manual , modalities    Consulted and Agree with Plan of Care  Patient       Patient will benefit from skilled therapeutic intervention in order to improve the following deficits and impairments:  Pain, Postural dysfunction, Decreased strength, Decreased activity tolerance, Decreased range of motion, Increased muscle spasms, Difficulty walking, Decreased mobility  Visit Diagnosis: Abnormal posture  Muscle spasm of back  Chronic bilateral low back pain with left-sided sciatica  Muscle weakness (generalized)     Problem List Patient Active Problem List   Diagnosis Date Noted  . Suspected exposure to mold 11/16/2016  . Obesity, morbid (HCC) 10/07/2016  . S/P lumbar spinal fusion 06/25/2015  . Pain in joint, shoulder region 07/23/2014  . Menorrhagia with irregular cycle 07/03/2014  . Lumbar radiculopathy, chronic 12/05/2013  . Cocaine abuse (HCC) 09/15/2012  . Tobacco abuse 05/03/2011  . Obesity (BMI 30-39.9) 09/04/2009  . CARPAL TUNNEL SYNDROME, BILATERAL 09/04/2009  . Chronic low back pain 04/09/2009  . Depression, major 11/06/2008    Dessie Comaavid J Dezyre Hoefer PT DPT  03/14/2018, 4:50 PM  [facility] [address] [place], [place], [address] Phone: [phone]   Fax:  [phone]  Name: Carol Gross MRN: [id number] Date of Birth: 12/04/1980

## 2018-03-20 ENCOUNTER — Ambulatory Visit: Payer: Medicaid Other | Admitting: Physical Therapy

## 2018-03-20 DIAGNOSIS — G8929 Other chronic pain: Secondary | ICD-10-CM

## 2018-03-20 DIAGNOSIS — M5442 Lumbago with sciatica, left side: Secondary | ICD-10-CM

## 2018-03-20 DIAGNOSIS — R293 Abnormal posture: Secondary | ICD-10-CM | POA: Diagnosis not present

## 2018-03-20 DIAGNOSIS — M62830 Muscle spasm of back: Secondary | ICD-10-CM

## 2018-03-20 DIAGNOSIS — M6281 Muscle weakness (generalized): Secondary | ICD-10-CM | POA: Diagnosis not present

## 2018-03-20 NOTE — Therapy (Signed)
[place], [place], [address] Phone: [phone]   Fax:  [phone]  Physical Therapy Treatment/Discahrge   Patient Details  Name: Carol Gross MRN: [id number] Date of Birth: 04-15-1980 Referring Provider (PT): Elijio Miles, MD   Encounter Date: 03/20/2018  PT End of Session - 03/20/18 1156    Visit Number  4    Number of Visits  12    Date for PT Re-Evaluation  03/31/18    PT Start Time  1112    PT Stop Time  1153    PT Time Calculation (min)  41 min    Activity Tolerance  Patient tolerated treatment well    Behavior During Therapy  [facility] for tasks assessed/performed       Past Medical History:  Diagnosis Date  . Arthritis   . Back pain    sees ortho  . Carpal tunnel syndrome   . Chronic back pain   . Depression   . Fibromyalgia    ?  Marland Kitchen Headache(784.0)   . Hypertension    previously on. d/c'd by dr over month ago  . Shortness of breath    exertion    Past Surgical History:  Procedure Laterality Date  . ABDOMINAL EXPOSURE N/A 06/25/2015   Procedure: ABDOMINAL EXPOSURE;  Surgeon: Rosetta Posner, MD;  Location: [facility];  Service: Vascular;  Laterality: N/A;  . ANTERIOR LUMBAR FUSION N/A 06/25/2015   Procedure: ANTERIOR LUMBAR INTERBODY  FUSION LUMBAR FIVE -SACRAL ONE;  Surgeon: Eustace Moore, MD;  Location: [place] [facility];  Service: Neurosurgery;  Laterality: N/A;  . LAMINECTOMY WITH POSTERIOR LATERAL ARTHRODESIS LEVEL 1 N/A 11/03/2016   Procedure: Posterior Lateral Fusion - Lumbar five-Sacrum one  with pedicle screw fixation;  Surgeon: Eustace Moore, MD;  Location: [facility];  Service: Neurosurgery;  Laterality: N/A;  . LUMBAR LAMINECTOMY/DECOMPRESSION MICRODISCECTOMY  03/03/2012   Procedure: LUMBAR LAMINECTOMY/DECOMPRESSION MICRODISCECTOMY 1 LEVEL;  Surgeon: Floyce Stakes, MD;  Location: [facility];  Service: Neurosurgery;  Laterality: Left;  Left Lumbar five sacral one Diskectomy  .  TUBAL LIGATION    . WISDOM TOOTH EXTRACTION      There were no vitals filed for this visit.  Subjective Assessment - 03/20/18 1118    Subjective  Patient reports significant spasming last night. She has pain running down her left leg. She feels like overall the back is about the same.     Pertinent History  3 back surgeries.     How long can you sit comfortably?  60 min    How long can you stand comfortably?  short periods    How long can you walk comfortably?  200 feet     Diagnostic tests  xrays     Patient Stated Goals  She wants to derease pain. and be comfortable.     Currently in Pain?  Yes    Pain Score  7     Pain Location  Back    Pain Orientation  Right    Pain Descriptors / Indicators  Aching    Pain Type  Chronic pain    Pain Onset  More than a month ago    Pain Frequency  Constant    Aggravating Factors   standing and walking     Pain Relieving Factors  rest          [facility] PT Assessment - 03/20/18 0001      AROM   Lumbar Flexion  40    Lumbar Extension  10    Lumbar - Right Rotation  limited 25% with pain     Lumbar - Left Rotation  limited 50% with increased pain       Strength   Overall Strength Comments  right LE 5/5 left hip flexion 4/5 knee extension 4/5       Palpation   Palpation comment  continues to have significant tenderness to palpation byut improved sinceh she began perfroming self soft tisssue mobilization.                    [facility] Adult PT Treatment/Exercise - 03/20/18 0001      Self-Care   Other Self-Care Comments   reviewed progression of all activity. Reviewed plan going forward as it pertains to increasing reps and sets. reviewed gym activity to avoid.       Lumbar Exercises: Stretches   Active Hamstring Stretch Limitations  3x20 sec hold     Piriformis Stretch Limitations  seated with towel 2x20 sec hold mod cuing for technique       Lumbar Exercises: Machines for Strengthening   Other Lumbar Machine Exercise  row2x10  10 lb with cuing for abdominal brace    Other Lumbar Machine Exercise  lat pull down x10 10lb with cuing for posture and ab brace       Lumbar Exercises: Standing   Other Standing Lumbar Exercises  shoulder extneison x10 red with abdominal brace; shoulder retraction x10 with abdominal brace;       Lumbar Exercises: Seated   Other Seated Lumbar Exercises  given a harder bacd but advised to only use when she is ready             PT Education - 03/20/18 1119    Education Details  reviewed HEP, symptom management     Person(s) Educated  Patient    Methods  Explanation;Demonstration;Tactile cues;Verbal cues    Comprehension  Verbalized understanding;Returned demonstration;Verbal cues required;Tactile cues required       PT Short Term Goals - 03/20/18 1204      PT SHORT TERM GOAL #1   Title  She will be independent with initial HEP    Baseline  perfroming her current program     Time  3    Period  Weeks    Status  Not Met      PT SHORT TERM GOAL #2   Title  She will report 10 % improvement in pain overall    Baseline  7/10 pain still     Time  3    Period  Weeks    Status  Not Met        PT Long Term Goals - 03/20/18 1204      PT LONG TERM GOAL #1   Title  She will be independent with all HEP issued    Baseline  She is independent with all HEP issued reviewed gym exercises this visit     Time  6    Period  Weeks    Status  Achieved      PT LONG TERM GOAL #2   Title  She will report pain overall decr 30% or more with activity    Baseline  continues to have 7-8/10 pain     Time  6    Period  Weeks    Status  Achieved      PT LONG TERM GOAL #3   Title  She will  be able to bend to 50 degrees with only mod pain incr    Baseline  40 degrees     Period  Weeks    Status  Not Met      PT LONG TERM GOAL #4   Title  She will report able to sit with support for 45-60 min without incr pain    Baseline  has to move during sitting more than 30 min    Time  6    Period   Weeks    Status  Not Met      PT LONG TERM GOAL #5   Title  She will report LT leg symproms decr 30 % or more    Baseline  constant LT leg pain and numb foot    Time  6    Period  Weeks    Status  Not Met            Plan - 03/20/18 1157    Clinical Impression Statement  Patient feels like her back has not improved. Much. She feels like she can continue her exercises on her own. She is going to go back to the gym. She has been using her tennis ball and doing her stretches. The patient continues to have significant left shoulder pain. She will return to the MD. She may bnenfit from therapy for her shoulder but we will D/C her shoulder at this time.     Clinical Presentation  Unstable    Clinical Decision Making  Moderate    Rehab Potential  Fair    PT Frequency  1x / week    PT Duration  3 weeks    PT Treatment/Interventions  Dry needling;Passive range of motion;Manual techniques;Patient/family education;Therapeutic exercise;Therapeutic activities;Moist Heat;Electrical Stimulation    PT Next Visit Plan  D/C back.     Consulted and Agree with Plan of Care  Patient       Patient will benefit from skilled therapeutic intervention in order to improve the following deficits and impairments:  Pain, Postural dysfunction, Decreased strength, Decreased activity tolerance, Decreased range of motion, Increased muscle spasms, Difficulty walking, Decreased mobility  Visit Diagnosis: Abnormal posture  Muscle spasm of back  Chronic bilateral low back pain with left-sided sciatica  Muscle weakness (generalized)    PHYSICAL THERAPY DISCHARGE SUMMARY  Visits from Start of Care: 4  Current functional level related to goals / functional outcomes: Continued pain but doing more    Remaining deficits: Pain with ADL's and IADL's    Education / Equipment: HEP   Plan: Patient agrees to discharge.  Patient goals were not met. Patient is being discharged due to lack of progress.  ?????       Problem List Patient Active Problem List   Diagnosis Date Noted  . Suspected exposure to mold 11/16/2016  . Obesity, morbid ([place]) 10/07/2016  . S/P lumbar spinal fusion 06/25/2015  . Pain in joint, shoulder region 07/23/2014  . Menorrhagia with irregular cycle 07/03/2014  . Lumbar radiculopathy, chronic 12/05/2013  . Cocaine abuse ([place]) 09/15/2012  . Tobacco abuse 05/03/2011  . Obesity (BMI 30-39.9) 09/04/2009  . CARPAL TUNNEL SYNDROME, BILATERAL 09/04/2009  . Chronic low back pain 04/09/2009  . Depression, major 11/06/2008    Carney Living PT DPT 03/20/2018, 12:16 PM  [facility] [address] [place], [place], [address] Phone: [phone]   Fax:  [phone]  Name: Carol Gross MRN: [id number] Date of Birth: 11/01/1980

## 2018-04-26 ENCOUNTER — Other Ambulatory Visit: Payer: Self-pay | Admitting: Family Medicine

## 2018-05-08 ENCOUNTER — Ambulatory Visit (INDEPENDENT_AMBULATORY_CARE_PROVIDER_SITE_OTHER): Payer: Medicaid Other | Admitting: Family Medicine

## 2018-05-08 ENCOUNTER — Encounter: Payer: Self-pay | Admitting: Family Medicine

## 2018-05-08 ENCOUNTER — Other Ambulatory Visit: Payer: Self-pay

## 2018-05-08 VITALS — BP 122/88 | HR 83 | Temp 98.5°F | Ht 67.0 in | Wt 229.4 lb

## 2018-05-08 DIAGNOSIS — M545 Low back pain, unspecified: Secondary | ICD-10-CM

## 2018-05-08 DIAGNOSIS — M62838 Other muscle spasm: Secondary | ICD-10-CM

## 2018-05-08 DIAGNOSIS — G8929 Other chronic pain: Secondary | ICD-10-CM

## 2018-05-08 DIAGNOSIS — F321 Major depressive disorder, single episode, moderate: Secondary | ICD-10-CM

## 2018-05-08 DIAGNOSIS — M5416 Radiculopathy, lumbar region: Secondary | ICD-10-CM

## 2018-05-08 MED ORDER — PAROXETINE HCL 30 MG PO TABS: 30.0000 mg | ORAL_TABLET | Freq: Every day | ORAL | 1 refills | Status: DC

## 2018-05-08 MED ORDER — LIDOCAINE 5 % EX PTCH: 1.0000 | MEDICATED_PATCH | CUTANEOUS | 0 refills | Status: DC

## 2018-05-08 NOTE — Patient Instructions (Signed)
It was a pleasure to see you today! Thank you for choosing [facility] for your primary care. Carol Gross was seen for mood, back pain.   Our plans for today were:  I placed a PT referral for your back and your R shoulder area.   Please try to get the patch rx. If they say it needs authorization, get the over the counter version and try that. Call me and tell me how that worked.   I need to check on you in 1 month.   Best,  Dr. Chanetta Marshall

## 2018-05-08 NOTE — Progress Notes (Signed)
   CC: back pain, mood   HPI  Mood - paxil seemed to help her with pain as well as feeling like coping was coming more easily. Last took this in maybe January. She was too busy to call me to ask for a refill. She missed therapy here due to her uncle's passing. She would like to restart therapy. She would like to look for a community medicaid therapist to have continuity.   Back - got better with PT. Has gabapentin that has made her sleepy but didn't so much help her back. Stopped paxil as above. Hasn't tried any topical patches.   Previously was having menorrhagia, but this month has been normal. She will contact me if she has further concerns.   ROS: Denies CP, SOB, abdominal pain, dysuria, changes in BMs.   CC, SH/smoking status, and VS noted  Objective: BP 122/88   Pulse 83   Temp 98.5 F (36.9 C) (Oral)   Ht 5\' 7"  (1.702 m)   Wt 229 lb 6.4 oz (104.1 kg)   LMP 04/13/2018 (Approximate)   SpO2 96%   BMI 35.93 kg/m  Gen: NAD, alert, cooperative, and pleasant. HEENT: NCAT, EOMI, PERRL CV: RRR, no murmur Resp: CTAB, no wheezes, non-labored MSK: midline lumbar back TTP without stepoffs. TTP of R trapezius body.  Ext: No edema, warm Neuro: Alert and oriented, Speech clear, No gross deficits  Assessment and plan:  Depression, major Restart paxil, given therapist list, recheck in 1 month.   Lumbar radiculopathy, chronic benefited from PT. New rx for PT given, trial topical lido patches. No new red flags.   Trapezius muscle spasm - PT noticed this on their work together. Referred back to PT for more exercises.   Orders Placed This Encounter  Procedures  . Ambulatory referral to Physical Therapy    Referral Priority:   Routine    Referral Type:   Physical Medicine    Referral Reason:   Specialty Services Required    Requested Specialty:   Physical Therapy    Number of Visits Requested:   1    Meds ordered this encounter  Medications  . lidocaine (LIDODERM) 5 %    Sig:  Place 1 patch onto the skin daily. Remove & Discard patch within 12 hours or as directed by MD    Dispense:  30 patch    Refill:  0  . PARoxetine (PAXIL) 30 MG tablet    Sig: Take 1 tablet (30 mg total) by mouth daily.    Dispense:  30 tablet    Refill:  1     Loni Muse, MD, PGY3 05/09/2018 1:37 PM

## 2018-05-09 NOTE — Assessment & Plan Note (Signed)
benefited from PT. New rx for PT given, trial topical lido patches. No new red flags.

## 2018-05-09 NOTE — Assessment & Plan Note (Signed)
Restart paxil, given therapist list, recheck in 1 month.

## 2018-06-25 NOTE — Progress Notes (Signed)
Subjective:   Patient ID: Carol Gross    DOB: [birth year], 38 y.o. female   MRN: [id number]  Carol Gross is a 38 y.o. female with a history of bilateral carpal tunnel, chronic lumbar radiculopathy s/p lumbar spinal fusion, obesity, depression, tobacco use, cocaine use, menorrhagia here for   Difficulty sleeping - chronic lumbar radiculopathy with multiple prior surgeries (laminectomy and discectomy L5-S1 in 2014, ALIF L5-S1 2017, L5-S1 fusion 10/2016) with ongoing back pain and difficulties balancing. - last seen 05/08/2018 referred to PT and provided lidocaine patches, did not work. Previously tried TENS units.  - previously seen by Pain Management, last seen 2018. Hasn't been able to go due to outstanding balance.  - About 1.5 months ago was making her bed and lost her balance and fell. Foor the past 2-3 weeks reports difficulties sleeping and unable to get comfortable. Numbness in legs L>R has gotten worse. Describes numbness starting at the small of her back along L side, coming in front of her leg and going down to the top of her foot. Reports some leg cramping L>R, off and on since her last surgery. Also reports some numbness and tingling in fingertips. - Denies fevers, vision changes. - some loose bowels lately but denies incontinence of bowel or bladder. - taking tylenol and ibuprofen prn - takes gabapentin prn but makes her groggy so she doesn't like to take this a lot. - stopped paxil 3 weeks ago.  Review of Systems:  Per HPI.  PMFSH, medications and smoking status reviewed.  Objective:   BP 116/70   Temp 98.2 F (36.8 C) (Oral)   Wt 227 lb (103 kg)   LMP 06/12/2018   BMI 35.55 kg/m  Vitals and nursing note reviewed.  General: obese female, in no acute distress with non-toxic appearance CV: regular rate and rhythm without murmurs, rubs, or gallops Lungs: clear to auscultation bilaterally with normal work of breathing Skin: warm, dry, no rashes or lesions  Extremities: warm and well perfused, normal tone MSK: ROM of back limited 2/2 pain (most pain with L sidebending at waist). TTP along lumbar midline, no appreciable paravertebral hypertonicity or tissue texture changes. Difficulties getting up from chair. Gait normal. Neuro: Alert and oriented, speech normal  Assessment & Plan:   Lumbar radiculopathy, chronic Chronic with recent worsening s/p fall at home. Progressive numbness along L5 distribution concerning for possible disruption or displacement of hardware from previous surgery, will obtain imaging. No other systemic symptoms to suggest alternate process (infectious, malignancy, CNS). Advised to call neurosurgeon for evaluation. No red flags concerning for more emergent evaluation. Provided meloxicam and flexeril for symptomatic relief. Return precautions provided.  Orders Placed This Encounter  Procedures  . CT Lumbar Spine Wo Contrast    Epic order Ins-mcd Wt  227/ no needs/ fwp and pt    Standing Status:   Future    Standing Expiration Date:   09/25/2019    Order Specific Question:   Is patient pregnant?    Answer:   No    Order Specific Question:   Preferred imaging location?    Answer:   [facility]    Order Specific Question:   Radiology Contrast Protocol - do NOT remove file path    Answer:   [email or web address]   Meds ordered this encounter  Medications  . acetaminophen (TYLENOL) 500 MG tablet    Sig: Take 2 tablets (1,000 mg total) by mouth every 6 (six) hours as needed for mild pain.  Dispense:  30 tablet    Refill:  0  . DISCONTD: ibuprofen (ADVIL) 200 MG tablet    Sig: Take 2 tablets (400 mg total) by mouth every 6 (six) hours as needed for moderate pain.    Dispense:  30 tablet    Refill:  0  . meloxicam (MOBIC) 7.5 MG tablet    Sig: Take 1 tablet (7.5 mg total) by mouth daily.    Dispense:  30 tablet    Refill:  0  . cyclobenzaprine (FLEXERIL) 5 MG tablet    Sig: Take 1  tablet (5 mg total) by mouth 3 (three) times daily as needed for muscle spasms.    Dispense:  30 tablet    Refill:  1   Precepted with Dr. Durwin Nora.  Ellwood Dense, DO PGY-2, [facility] 06/26/2018 9:20 PM

## 2018-06-26 ENCOUNTER — Other Ambulatory Visit: Payer: Self-pay

## 2018-06-26 ENCOUNTER — Ambulatory Visit (INDEPENDENT_AMBULATORY_CARE_PROVIDER_SITE_OTHER): Payer: Medicaid Other | Admitting: Family Medicine

## 2018-06-26 VITALS — BP 116/70 | Temp 98.2°F | Wt 227.0 lb

## 2018-06-26 DIAGNOSIS — M5416 Radiculopathy, lumbar region: Secondary | ICD-10-CM | POA: Diagnosis not present

## 2018-06-26 MED ORDER — MELOXICAM 7.5 MG PO TABS: 7.5000 mg | ORAL_TABLET | Freq: Every day | ORAL | 0 refills | Status: DC

## 2018-06-26 MED ORDER — ACETAMINOPHEN 500 MG PO TABS: 1000.0000 mg | ORAL_TABLET | Freq: Four times a day (QID) | ORAL | 0 refills | Status: DC | PRN

## 2018-06-26 MED ORDER — CYCLOBENZAPRINE HCL 5 MG PO TABS: 5.0000 mg | ORAL_TABLET | Freq: Three times a day (TID) | ORAL | 1 refills | Status: AC | PRN

## 2018-06-26 MED ORDER — IBUPROFEN 200 MG PO TABS: 400.0000 mg | ORAL_TABLET | Freq: Four times a day (QID) | ORAL | 0 refills | Status: DC | PRN

## 2018-06-26 NOTE — Assessment & Plan Note (Signed)
Chronic with recent worsening s/p fall at home. Progressive numbness along L5 distribution concerning for possible disruption or displacement of hardware from previous surgery, will obtain imaging. No other systemic symptoms to suggest alternate process (infectious, malignancy, CNS). Advised to call neurosurgeon for evaluation. No red flags concerning for more emergent evaluation. Provided meloxicam and flexeril for symptomatic relief. Return precautions provided.

## 2018-06-26 NOTE — Patient Instructions (Addendum)
It was great to see you!  Our plans for today:  - Call your neurosurgeon to make sure everything is in place where it should be.  - Take tylenol and flexeril as needed for pain and cramping. - Take mobic daily for the next few weeks for pain. - Call Physical therapy to schedule an appointment (see below).   Take care and seek immediate care sooner if you develop any concerns.   Dr. [facility]  (Physical Therapy) [facility] [address] [place], [place], [address] Phone: [phone]   Fax:  [phone]

## 2018-06-27 ENCOUNTER — Other Ambulatory Visit: Payer: Self-pay | Admitting: Family Medicine

## 2018-07-29 ENCOUNTER — Other Ambulatory Visit: Payer: Self-pay | Admitting: Family Medicine

## 2018-08-04 ENCOUNTER — Other Ambulatory Visit: Payer: Self-pay

## 2018-08-04 ENCOUNTER — Ambulatory Visit
Admission: RE | Admit: 2018-08-04 | Discharge: 2018-08-04 | Disposition: A | Discharge status: Home / Self Care | Payer: Medicaid Other | Source: Ambulatory Visit | Attending: Family Medicine | Admitting: Family Medicine

## 2018-08-04 DIAGNOSIS — M5416 Radiculopathy, lumbar region: Secondary | ICD-10-CM

## 2018-08-04 DIAGNOSIS — M545 Low back pain: Secondary | ICD-10-CM | POA: Diagnosis not present

## 2018-08-11 ENCOUNTER — Telehealth (INDEPENDENT_AMBULATORY_CARE_PROVIDER_SITE_OTHER): Payer: Medicaid Other | Admitting: Family Medicine

## 2018-08-11 ENCOUNTER — Other Ambulatory Visit: Payer: Self-pay

## 2018-08-11 DIAGNOSIS — R1032 Left lower quadrant pain: Secondary | ICD-10-CM

## 2018-08-11 NOTE — Progress Notes (Signed)
[facility] Telemedicine Visit  Patient consented to have virtual visit. Method of visit: Telephone  Encounter participants: Patient: Carol Gross - located at home Provider: Ralene Ok - located at [facility] Others (if applicable): none  Chief Complaint: continued back pain   HPI:  Continued back pain - she had her CT scan done and they told her everything was normal. She is still hurting, and she is having stomach issues. Her mom recently had stomach pain and then ended up with diverticulitis. She wonders whether this is hereditary. She has urgency to go BM, BMs are looser. No incontinence. Stomach pain started about a week ago. No sick contacts at home. No fever. Sounds like the pain is under her belly, sometimes she "lumps over" due to pain. Comes on every time she eats. Immediately after. One day had a bout of BMs off and of x 12 hours. No urinary sxs. Going to the bathroom her stomach feels better after. Sprite calms it down. Pain feels like nauseous, has been vomiting x 1. Not like menstrual cramps. Only taking meloxicam some days.   ROS: per HPI  Pertinent PMHx: chronic back pain   Exam:  Respiratory: speaks in long sentences easily.   Assessment/Plan:  Abdominal pain : This is difficult to assess via telephone.  I asked that she come in on Monday to have an exam and discuss this further, go to the emergency room if worsening over the weekend.  She was appreciative.  Time spent during visit with patient: 8 minutes

## 2018-08-14 ENCOUNTER — Encounter: Payer: Self-pay | Admitting: Family Medicine

## 2018-08-14 ENCOUNTER — Other Ambulatory Visit: Payer: Self-pay

## 2018-08-14 ENCOUNTER — Ambulatory Visit (INDEPENDENT_AMBULATORY_CARE_PROVIDER_SITE_OTHER): Payer: Medicaid Other | Admitting: Family Medicine

## 2018-08-14 DIAGNOSIS — M545 Low back pain, unspecified: Secondary | ICD-10-CM

## 2018-08-14 DIAGNOSIS — G8929 Other chronic pain: Secondary | ICD-10-CM

## 2018-08-14 MED ORDER — OMEPRAZOLE 40 MG PO CPDR: 40.0000 mg | DELAYED_RELEASE_CAPSULE | Freq: Every day | ORAL | 0 refills | Status: AC

## 2018-08-14 MED ORDER — AMITRIPTYLINE HCL 25 MG PO TABS: 25.0000 mg | ORAL_TABLET | Freq: Every day | ORAL | 0 refills | Status: DC

## 2018-08-14 NOTE — Patient Instructions (Addendum)
Take the acid medicine every day for 2 weeks,then every other day or on days where the acid hurts more.   For your back, we need a schedule of medicines:  When you wake up, eat a small snack to fill your stomach.  Take 2 pills acetaminophen.  Wait 6 hours then take acetaminophen again.  If you are having a bad day, you can add the flexeril during the day.  At bedtime, take 1 acetaminophen if needed. Also take the new medicine, called elavil.

## 2018-08-14 NOTE — Progress Notes (Signed)
   CC: abd pain, back pain   HPI  Abdominal pain: her back has always hurt, no change there since previous visits. Her calves cramp as well. Her foot turns in the with cramps. Her pain has been moving up her back for a month or so. 2 weeks ago it seemed to move to her stomach. Had decreased water intake around then but now increased again. Epigastric and both upper quadrants. Has had BMs x 4 this morning. Better after BM, but not completely. When she eats, she hurts and has to go to the bathroom. She feels indigestion this morning, but hasn't eaten today. Is taking meloxicam and flexeril every now and then. Hasn't been taking gabapentin at all. Stopped that after 3 days from last fill. Tried taking it at night, slept 8 hours, and then still sleepy the next day. No OTCs for the belly pain. No urinary changes. BMs are looser. The pain feels like bubbles trying to come up. Had blood in her stool the first day this happened 2 weeks ago. No more after that. Worse when she first wakes up before she has eaten.   Her dad had polio via blood transfusion as a child.   ROS: Denies CP, SOB, abdominal pain, dysuria, changes in BMs.   CC, SH/smoking status, and VS noted  Objective: BP 110/62   Pulse (!) 59   Wt 225 lb (102.1 kg)   LMP 07/19/2018 (Approximate)   SpO2 99%   BMI 35.24 kg/m  Gen: NAD, alert, cooperative, and pleasant. HEENT: NCAT, EOMI, PERRL CV: RRR, no murmur Resp: CTAB, no wheezes, non-labored Abd: soft, protuberant, TTP epigastric area, BS present, no guarding or organomegaly Ext: No edema, warm Neuro: Alert and oriented, Speech clear, No gross deficits  Assessment and plan:  Chronic low back pain Continued pain that is limiting her daily life. She does have a remote hx of cocaine use, but not in the years I've known her. I would not use opioids, but if I were continuing to care for her I would consider use of tramadol only for bad days if this would allow her increased mobility to  care for her kids and do ADLs. Recent imaging reassuring. She could not tolerate gabapentin 100mg  2/2 feeling too sleepy the next day, even taken QHS. I suspect lyrica would have the same effect. We will trial low dose amitriptyline, she should continue to schedule tylenol throughout the day. Avoid mobic for now given below.    Epigastric pain - likely due to gastritis from recent mobic use. Use daily PPI x 2 weeks and reassess. If no improvement may need EGD. No bleeding sxs to suggest true ulcer.   No orders of the defined types were placed in this encounter.   Meds ordered this encounter  Medications  . omeprazole (PRILOSEC) 40 MG capsule    Sig: Take 1 capsule (40 mg total) by mouth daily.    Dispense:  30 capsule    Refill:  0  . amitriptyline (ELAVIL) 25 MG tablet    Sig: Take 1 tablet (25 mg total) by mouth at bedtime.    Dispense:  90 tablet    Refill:  0    Ralene Ok, MD, PGY3 08/16/2018 10:55 AM

## 2018-08-16 NOTE — Assessment & Plan Note (Signed)
Continued pain that is limiting her daily life. She does have a remote hx of cocaine use, but not in the years I've known her. I would not use opioids, but if I were continuing to care for her I would consider use of tramadol only for bad days if this would allow her increased mobility to care for her kids and do ADLs. Recent imaging reassuring. She could not tolerate gabapentin 100mg  2/2 feeling too sleepy the next day, even taken QHS. I suspect lyrica would have the same effect. We will trial low dose amitriptyline, she should continue to schedule tylenol throughout the day. Avoid mobic for now given below.

## 2018-08-21 NOTE — Addendum Note (Signed)
Addended by: Glenis Smoker on: 08/21/2018 09:02 AM   Modules accepted: Level of Service

## 2018-08-29 ENCOUNTER — Other Ambulatory Visit: Payer: Self-pay | Admitting: Family Medicine

## 2018-10-02 ENCOUNTER — Other Ambulatory Visit: Payer: Self-pay

## 2018-11-29 ENCOUNTER — Other Ambulatory Visit: Payer: Self-pay | Admitting: Family Medicine

## 2018-12-11 ENCOUNTER — Other Ambulatory Visit: Payer: Self-pay

## 2018-12-11 ENCOUNTER — Ambulatory Visit (INDEPENDENT_AMBULATORY_CARE_PROVIDER_SITE_OTHER): Payer: Medicaid Other | Admitting: Family Medicine

## 2018-12-11 ENCOUNTER — Encounter: Payer: Self-pay | Admitting: Family Medicine

## 2018-12-11 VITALS — BP 127/87 | HR 74 | Ht 67.0 in

## 2018-12-11 DIAGNOSIS — M549 Dorsalgia, unspecified: Secondary | ICD-10-CM | POA: Diagnosis not present

## 2018-12-11 DIAGNOSIS — M5416 Radiculopathy, lumbar region: Secondary | ICD-10-CM | POA: Diagnosis not present

## 2018-12-11 DIAGNOSIS — G8929 Other chronic pain: Secondary | ICD-10-CM

## 2018-12-11 MED ORDER — AMITRIPTYLINE HCL 25 MG PO TABS: 25.0000 mg | ORAL_TABLET | Freq: Every day | ORAL | 0 refills | Status: DC

## 2018-12-11 NOTE — Patient Instructions (Addendum)
Dear Carol Gross,   It was good to see you! Thank you for taking your time to come in to be seen. Today, we discussed the following:   Back Pain    Neck pain: Avoid irritiation to the area. It sounds like the nerve is very irritated.   Financial Aid: I will call you about sports medicine for injection in your back  Sent referral for back  Be well,   Zettie Cooley, M.D   [place] [phone]  *Sign up for MyChart for instant access to your health profile, labs, orders, upcoming appointments or to contact your provider with questions*  ===================================================================================

## 2018-12-12 NOTE — Progress Notes (Signed)
  Subjective  CC: Back Pain (knots on right side and leg numbness ) and Neck Pain  Carol Gross is a 38 y.o. female who presents today with the following problems: Neck Pain  Patient presents today with right-sided neck pain.  The pain is located in the posteriorly and this paraspinal area.  Patient reports that pain will radiate to nuchal ridge and inferiorly to where the neck meets back.  Patient reports that this was acutely painful after her husband massage her shoulders.  Patient sleeps in multiple positions to get comfortable.  Patient has a history of lower back surgery and sometimes has difficulty getting comfortable at night.  She does not report any worsening when waking up from sleep.  Patient denies right-sided sensory or motor dysfunction.  She denies fevers, headaches, changes in vision, interference with daily responsibilities.  Pertinent P/F/SHx: History of lumbar Lachman ectomy/decompression, laminectomy, anterior lumbar fusion most recently in 2017.  Patient has chronic radiculopathy on her problem list.  Obesity. ROS: Pertinent ROS included in HPI. Objective  Physical Exam:  BP 127/87   Pulse 74   Ht 5\' 7"  (1.702 m)   LMP 12/04/2018 (Approximate)   BMI 35.24 kg/m  General: Well appearing female sitting comfortably in exam room. Non-toxic, appears well.  Neck: Tenderness to palpation of paraspinal muscles in cervical area that extends to nuchal ridge and to superior medial border of scapula.  She has limited range of motion of neck rotation bilaterally and can only move about 45 degrees to the left and to the right.  She does not have any tenderness to palpation of her cervical spine.  There is no other obvious erythema, swelling.  Imaging:  Most recent CT in 08/04/2018.  Showing solid fusion at L5-S1 with no impingement. Assessment & Plan    Problem List Items Addressed This Visit      Nervous and Auditory   Lumbar radiculopathy, chronic    Patient continues to  have pain in her lumbar spine.  Most recent CT does not show any abnormalities.  Instructed patient to follow-up with neurosurgeon for further evaluation.  No changes in medication today. Also discussed epidural steroid injections for pain- patient would have to be referred to [facility]. - Referral to physical therapy.        Relevant Medications   amitriptyline (ELAVIL) 25 MG tablet    Other Visit Diagnoses    Other chronic back pain    -  Primary   Relevant Medications   amitriptyline (ELAVIL) 25 MG tablet   Other Relevant Orders   Ambulatory referral to Physical Therapy     Health Maintenance Due  Topic Date Due  . TETANUS/TDAP  05/30/2017   Health Maintenance discussed with patient and patient agrees to address when able.    Wilber Oliphant, M.D.  PGY-2  Family Medicine  [phone] 12/13/2018 3:58 PM

## 2018-12-13 ENCOUNTER — Encounter: Payer: Self-pay | Admitting: Family Medicine

## 2018-12-13 NOTE — Assessment & Plan Note (Addendum)
Patient continues to have pain in her lumbar spine.  Most recent CT does not show any abnormalities.  Instructed patient to follow-up with neurosurgeon for further evaluation.  No changes in medication today. Also discussed epidural steroid injections for pain- patient would have to be referred to [facility]. - Referral to physical therapy.

## 2018-12-27 ENCOUNTER — Other Ambulatory Visit: Payer: Self-pay | Admitting: Family Medicine

## 2018-12-27 DIAGNOSIS — M5416 Radiculopathy, lumbar region: Secondary | ICD-10-CM

## 2018-12-27 NOTE — Progress Notes (Signed)
Received fax from [employer], Transport planner, at [facility] that patient is requesting a shower chair. Given her chronic back pain and debilitating symptoms, I have sent Rx to [facility] which is listed as patient's pharmacy.   Wilber Oliphant, M.D.  3:15 PM 12/27/2018

## 2019-01-04 ENCOUNTER — Ambulatory Visit: Payer: Medicaid Other | Attending: Family Medicine | Admitting: Physical Therapy

## 2019-01-04 ENCOUNTER — Other Ambulatory Visit: Payer: Self-pay

## 2019-01-04 ENCOUNTER — Encounter: Payer: Self-pay | Admitting: Physical Therapy

## 2019-01-04 DIAGNOSIS — M25562 Pain in left knee: Secondary | ICD-10-CM | POA: Insufficient documentation

## 2019-01-04 DIAGNOSIS — M5442 Lumbago with sciatica, left side: Secondary | ICD-10-CM | POA: Diagnosis not present

## 2019-01-04 DIAGNOSIS — M62830 Muscle spasm of back: Secondary | ICD-10-CM | POA: Diagnosis not present

## 2019-01-04 DIAGNOSIS — R293 Abnormal posture: Secondary | ICD-10-CM | POA: Insufficient documentation

## 2019-01-04 DIAGNOSIS — G8929 Other chronic pain: Secondary | ICD-10-CM | POA: Insufficient documentation

## 2019-01-04 DIAGNOSIS — M6281 Muscle weakness (generalized): Secondary | ICD-10-CM | POA: Insufficient documentation

## 2019-01-05 NOTE — Therapy (Signed)
[facility] [address] [place], [place], [address] Phone: [phone]   Fax:  [phone]  Physical Therapy Evaluation  Patient Details  Name: Carol Gross MRN: [id number] Date of Birth: 01-30-1981 Referring Provider (PT): Dr Candee Furbish    Encounter Date: 01/04/2019  PT End of Session - 01/04/19 1646    Visit Number  1    Number of Visits  12    Date for PT Re-Evaluation  02/15/19    PT Start Time  1630    PT Stop Time  1715    PT Time Calculation (min)  45 min    Activity Tolerance  Patient tolerated treatment well    Behavior During Therapy  [facility] for tasks assessed/performed       Past Medical History:  Diagnosis Date  . Arthritis   . Back pain    sees ortho  . Carpal tunnel syndrome   . Chronic back pain   . Cocaine abuse (HCC) 09/15/2012   Started in early 20's and persistent since that time.    . Depression   . Fibromyalgia    ?  Marland Kitchen Headache(784.0)   . Hypertension    previously on. d/c'd by dr over month ago  . Menorrhagia with irregular cycle 07/03/2014  . Shortness of breath    exertion    Past Surgical History:  Procedure Laterality Date  . ABDOMINAL EXPOSURE N/A 06/25/2015   Procedure: ABDOMINAL EXPOSURE;  Surgeon: Larina Earthly, MD;  Location: [facility];  Service: Vascular;  Laterality: N/A;  . ANTERIOR LUMBAR FUSION N/A 06/25/2015   Procedure: ANTERIOR LUMBAR INTERBODY  FUSION LUMBAR FIVE -SACRAL ONE;  Surgeon: Tia Alert, MD;  Location: [facility];  Service: Neurosurgery;  Laterality: N/A;  . LAMINECTOMY WITH POSTERIOR LATERAL ARTHRODESIS LEVEL 1 N/A 11/03/2016   Procedure: Posterior Lateral Fusion - Lumbar five-Sacrum one  with pedicle screw fixation;  Surgeon: Tia Alert, MD;  Location: [facility] OR;  Service: Neurosurgery;  Laterality: N/A;  . LUMBAR LAMINECTOMY/DECOMPRESSION MICRODISCECTOMY  03/03/2012   Procedure: LUMBAR LAMINECTOMY/DECOMPRESSION MICRODISCECTOMY 1 LEVEL;  Surgeon:  Karn Cassis, MD;  Location: [facility];  Service: Neurosurgery;  Laterality: Left;  Left Lumbar five sacral one Diskectomy  . TUBAL LIGATION    . WISDOM TOOTH EXTRACTION      There were no vitals filed for this visit.   Subjective Assessment - 01/04/19 1638    Subjective  Patient has had an increase in pain for the past 2 or 3 months. She has began to have clinking and pain in her knee. She is having increasing pain inher lower back and into her thoracic spine. The pain is makingit difficult to walk.    How long can you sit comfortably?  knee stiffens when she sits for too long    How long can you stand comfortably?  enough time to cook    How long can you walk comfortably?  can walk around the grocery store with pain    Diagnostic tests  CT: 08/04/2018 of lumbar spine: solid fusion    Patient Stated Goals  to have less pain    Currently in Pain?  Yes    Pain Score  9     Pain Location  Back    Pain Orientation  Left    Pain Descriptors / Indicators  Aching    Pain Type  Chronic pain    Pain Onset  More than a month ago  Pain Frequency  Constant    Aggravating Factors   Standing and walking    Pain Relieving Factors  rest/ tennis ball    Multiple Pain Sites  No    Pain Score  7    Pain Location  Knee    Pain Orientation  Left    Pain Descriptors / Indicators  Aching    Pain Type  Chronic pain    Pain Onset  More than a month ago    Pain Frequency  Constant    Aggravating Factors   Standing and walking    Pain Relieving Factors  rest/ massger         [facility] PT Assessment - 01/05/19 0001      Assessment   Medical Diagnosis  Low back Pain and Left Knee Pain     Referring Provider (PT)  Dr Trenda Moots     Onset Date/Surgical Date  --   has become wrose ofver the past month    Hand Dominance  Right    Next MD Visit  Nothing scheudled at this point     Prior Therapy  In January       Precautions   Precautions  None      Restrictions   Weight Bearing  Restrictions  No      Balance Screen   Has the patient fallen in the past 6 months  No    Has the patient had a decrease in activity level because of a fear of falling?   No    Is the patient reluctant to leave their home because of a fear of falling?   No      Home Environment   Additional Comments  3 steps into the house hurts her back       Prior Function   Level of Independence  Independent    Vocation  Unemployed    Leisure  walking around the mall       Cognition   Overall Cognitive Status  Within Functional Limits for tasks assessed    Attention  Focused    Focused Attention  Appears intact    Memory  Appears intact    Awareness  Appears intact    Problem Solving  Appears intact      Observation/Other Assessments   Observations  sits in a slumped posture     Focus on Therapeutic Outcomes (FOTO)   Mediciad       Sensation   Light Touch  Appears Intact    Additional Comments  numbness in the left toes       Coordination   Gross Motor Movements are Fluid and Coordinated  Yes    Fine Motor Movements are Fluid and Coordinated  Yes      Posture/Postural Control   Posture Comments  slumped posture in sitting       AROM   Lumbar Flexion  limited 50 %     Lumbar Extension  limited 75%     Lumbar - Left Side Bend  pain with rotation to the left     Lumbar - Right Rotation  feels tightness     Lumbar - Left Rotation  feels tightness       Strength   Overall Strength Comments  5/5 gross right LE strength     Left Hip Flexion  3+/5    Left Hip ABduction  4/5    Left Hip ADduction  4/5    Left Knee Flexion  3+/5    Left Knee Extension  3+/5      Palpation   Patella mobility  decreased patella mobility lateral shift and tilt     Spinal mobility  unable to assess     Palpation comment  significant tenderness to palpation in the lumbar spine       Bed Mobility   Bed Mobility  --   unable to lie supine for more then a few minutes      Ambulation/Gait   Gait  Comments  very slow gait but equal single leg stance bilateral                 Objective measurements completed on examination: See above findings.      [facility] Adult PT Treatment/Exercise - 01/05/19 0001      Lumbar Exercises: Stretches   Active Hamstring Stretch  3 reps;20 seconds      Lumbar Exercises: Seated   Other Seated Lumbar Exercises  seated clamshell yellow 2x10       Manual Therapy   Manual Therapy  Taping    McConnell  medical connel taping of the knee              PT Education - 01/05/19 0728    Education Details  benefits and risk of taping, removal of tape, HEP, symptom mangement; postural correction    Person(s) Educated  Patient    Methods  Explanation;Demonstration;Tactile cues;Verbal cues;Handout    Comprehension  Verbalized understanding;Returned demonstration;Verbal cues required;Tactile cues required       PT Short Term Goals - 01/05/19 0737      PT SHORT TERM GOAL #1   Title  She will be independent with initial HEP    Baseline  no HEP    Time  3    Period  Weeks    Status  On-going      PT SHORT TERM GOAL #2   Title  Patient will increase lumbar flexion by 25%    Baseline  50% limited    Time  3    Period  Weeks    Status  On-going      PT SHORT TERM GOAL #3   Title  Patient will increase left hip flexion by 30 degrees    Time  3    Period  Weeks    Status  New    Target Date  01/26/19        PT Long Term Goals - 01/05/19 0738      PT LONG TERM GOAL #1   Title  Patient will stand for 1 hour without increased lumbar pain in order to perfrom ADL    Baseline  can noit stand for more then 20 min without pain    Time  6    Period  Weeks    Status  New    Target Date  02/16/19      PT LONG TERM GOAL #2   Title  Patint wiill lie supine without increased pain    Time  6    Period  Weeks    Status  New    Target Date  02/16/19             Plan - 01/05/19 0719    Clinical Impression Statement  Patient is a  38 year old female with significant low back pain and a recent developement of knee pain and crepitus. As her knee pain has increased her low back pain has increased as  well. She has significant limitations in spinal and left hip mobility. She has tenderness to palpation in her lumbar spine and into her lateral hip. She had tenderness around her trochanter as well. Signs and symptoms in her knee are consitent with patella femoral dysfuction. Therapy taped her knee today to see if it will reduce her pain and crepitus. Her knee is effecting her gsait which is likley exacerbating her lumbar spine. Therapy will continue to work on manual therapy to reduce pain and core stability.    Personal Factors and Comorbidities  Comorbidity 1;Comorbidity 2;Fitness    Comorbidities  obesity. lumbar fusion,    Examination-Activity Limitations  Bend;Lift;Locomotion Level;Sit;Squat;Stairs;Stand    Examination-Participation Restrictions  Shop;Meal Prep;Driving;Community Activity    Stability/Clinical Decision Making  Evolving/Moderate complexity    Clinical Decision Making  Moderate    Rehab Potential  Good    PT Frequency  2x / week    PT Duration  6 weeks    PT Treatment/Interventions  ADLs/Self Care Home Management;Cryotherapy;Electrical Stimulation;Iontophoresis 4mg /ml Dexamethasone;Gait training;Stair training;Functional mobility training;Therapeutic activities;Therapeutic exercise;Neuromuscular re-education;Patient/family education;Manual techniques;Passive range of motion;Taping;Dry needling;Spinal Manipulations;Joint Manipulations    PT Next Visit Plan  Patient can not lie supine for very long. She does best in side lying and sitting. egin with postural correction; consdier supine activity on a wedge. Has limited hip mobility; soft tissue work wot ht elumbar spine and hip; continue taping of the knee if helpful; quad sets ; glut sets; PPT; modalities PRN.    PT Home Exercise Plan  limited by time and pain level:  hamstring stretch  seated; seated hip abduction yellow    Consulted and Agree with Plan of Care  Patient       Patient will benefit from skilled therapeutic intervention in order to improve the following deficits and impairments:  Abnormal gait, Decreased endurance, Increased muscle spasms, Decreased activity tolerance, Decreased safety awareness, Pain, Postural dysfunction, Increased fascial restricitons, Decreased range of motion, Improper body mechanics, Difficulty walking  Visit Diagnosis: Abnormal posture  Chronic bilateral low back pain with left-sided sciatica  Chronic pain of left knee  Muscle spasm of back  Muscle weakness (generalized)     Problem List Patient Active Problem List   Diagnosis Date Noted  . S/P lumbar spinal fusion 06/25/2015  . Lumbar radiculopathy, chronic 12/05/2013  . Tobacco abuse 05/03/2011  . Obesity (BMI 30-39.9) 09/04/2009  . Chronic low back pain 04/09/2009  . Depression, major 11/06/2008    Dessie Comaavid J Eli Adami PT DPT 01/05/2019, 7:52 AM  [facility] [address] [place], [place], [address] Phone: [phone]   Fax:  [phone]  Name: Deatra InaRaquel L Dils MRN: [id number] Date of Birth: 1980/11/15

## 2019-01-18 ENCOUNTER — Encounter: Payer: Self-pay | Admitting: Physical Therapy

## 2019-01-18 ENCOUNTER — Ambulatory Visit: Payer: Medicaid Other | Admitting: Physical Therapy

## 2019-01-18 ENCOUNTER — Other Ambulatory Visit: Payer: Self-pay

## 2019-01-18 DIAGNOSIS — M6281 Muscle weakness (generalized): Secondary | ICD-10-CM | POA: Diagnosis not present

## 2019-01-18 DIAGNOSIS — M5442 Lumbago with sciatica, left side: Secondary | ICD-10-CM | POA: Diagnosis not present

## 2019-01-18 DIAGNOSIS — M25562 Pain in left knee: Secondary | ICD-10-CM | POA: Diagnosis not present

## 2019-01-18 DIAGNOSIS — G8929 Other chronic pain: Secondary | ICD-10-CM

## 2019-01-18 DIAGNOSIS — M62830 Muscle spasm of back: Secondary | ICD-10-CM

## 2019-01-18 DIAGNOSIS — R293 Abnormal posture: Secondary | ICD-10-CM | POA: Diagnosis not present

## 2019-01-19 ENCOUNTER — Encounter: Payer: Self-pay | Admitting: Physical Therapy

## 2019-01-19 NOTE — Therapy (Signed)
Carol Gross, [place], [address] Phone: [phone]   Fax:  [phone]  Physical Therapy Treatment  Patient Details  Name: Carol Gross MRN: [id number] Date of Birth: January 25, 1981 Referring Provider (PT): Dr Trenda Moots    Encounter Date: 01/18/2019  PT End of Session - 01/19/19 1245    Visit Number  2    Number of Visits  12    Date for PT Re-Evaluation  02/15/19    Authorization Type  [employer]    PT Start Time  6962   Patient 10 minutes late   PT Stop Time  1630    PT Time Calculation (min)  35 min    Activity Tolerance  Patient tolerated treatment well    Behavior During Therapy  [facility] for tasks assessed/performed       Past Medical History:  Diagnosis Date  . Arthritis   . Back pain    sees ortho  . Carpal tunnel syndrome   . Chronic back pain   . Cocaine abuse ([place]) 09/15/2012   Started in early 20's and persistent since that time.    . Depression   . Fibromyalgia    ?  Marland Kitchen Headache(784.0)   . Hypertension    previously on. d/c'd by dr over month ago  . Menorrhagia with irregular cycle 07/03/2014  . Shortness of breath    exertion    Past Surgical History:  Procedure Laterality Date  . ABDOMINAL EXPOSURE N/A 06/25/2015   Procedure: ABDOMINAL EXPOSURE;  Surgeon: Rosetta Posner, MD;  Location: [facility];  Service: Vascular;  Laterality: N/A;  . ANTERIOR LUMBAR FUSION N/A 06/25/2015   Procedure: ANTERIOR LUMBAR INTERBODY  FUSION LUMBAR FIVE -SACRAL ONE;  Surgeon: Eustace Moore, MD;  Location: [place] [facility];  Service: Neurosurgery;  Laterality: N/A;  . LAMINECTOMY WITH POSTERIOR LATERAL ARTHRODESIS LEVEL 1 N/A 11/03/2016   Procedure: Posterior Lateral Fusion - Lumbar five-Sacrum one  with pedicle screw fixation;  Surgeon: Eustace Moore, MD;  Location: [place];  Service: Neurosurgery;  Laterality: N/A;  . LUMBAR LAMINECTOMY/DECOMPRESSION MICRODISCECTOMY  03/03/2012   Procedure: LUMBAR  LAMINECTOMY/DECOMPRESSION MICRODISCECTOMY 1 LEVEL;  Surgeon: Floyce Stakes, MD;  Location: [facility];  Service: Neurosurgery;  Laterality: Left;  Left Lumbar five sacral one Diskectomy  . TUBAL LIGATION    . WISDOM TOOTH EXTRACTION      There were no vitals filed for this visit.  Subjective Assessment - 01/18/19 1606    Subjective  Patient reports her lower back and knee are hurting. Her back pain today is about a 7-8/10. She reports over the weekend she had trouble getting out of bed.    How long can you sit comfortably?  knee stiffens when she sits for too long    How long can you stand comfortably?  enough time to cook    How long can you walk comfortably?  can walk around the grocery store with pain    Diagnostic tests  CT: 08/04/2018 of lumbar spine: solid fusion    Patient Stated Goals  to have less pain    Currently in Pain?  Yes    Pain Score  7     Pain Location  Back    Pain Orientation  Left    Pain Descriptors / Indicators  Aching    Pain Onset  More than a month ago    Pain Frequency  Constant    Aggravating Factors  standing and walking    Pain Relieving Factors  rest    Multiple Pain Sites  Yes    Pain Score  6    Pain Location  Knee    Pain Orientation  Left    Pain Descriptors / Indicators  Aching    Pain Type  Chronic pain    Pain Onset  More than a month ago    Pain Frequency  Constant    Aggravating Factors   standing and walking    Pain Relieving Factors  rest    Effect of Pain on Daily Activities  difficulty walking                       [facility] Adult PT Treatment/Exercise - 01/19/19 0001      Lumbar Exercises: Stretches   Active Hamstring Stretch  3 reps;20 seconds      Lumbar Exercises: Seated   Other Seated Lumbar Exercises  quad set 2x10 with cuing for technique     Other Seated Lumbar Exercises  seated clamshell yellow 2x10       Manual Therapy   Manual Therapy  Taping;Manual Traction    Manual therapy comments  reviewed  tennis ball for self sft tissue mobilization     Manual Traction  LAD 2x10     McConnell  medical connel taping of the knee; reviewed slef taping. Let patient tape hersellf              PT Education - 01/19/19 1244    Education Details  HEP and symptom mnanagent, improtance of moving    Person(s) Educated  Patient    Methods  Explanation;Demonstration;Tactile cues;Verbal cues       PT Short Term Goals - 01/05/19 0737      PT SHORT TERM GOAL #1   Title  She will be independent with initial HEP    Baseline  no HEP    Time  3    Period  Weeks    Status  On-going      PT SHORT TERM GOAL #2   Title  Patient will increase lumbar flexion by 25%    Baseline  50% limited    Time  3    Period  Weeks    Status  On-going      PT SHORT TERM GOAL #3   Title  Patient will increase left hip flexion by 30 degrees    Time  3    Period  Weeks    Status  New    Target Date  01/26/19        PT Long Term Goals - 01/05/19 0738      PT LONG TERM GOAL #1   Title  Patient will stand for 1 hour without increased lumbar pain in order to perfrom ADL    Baseline  can noit stand for more then 20 min without pain    Time  6    Period  Weeks    Status  New    Target Date  02/16/19      PT LONG TERM GOAL #2   Title  Patint wiill lie supine without increased pain    Time  6    Period  Weeks    Status  New    Target Date  02/16/19            Plan - 01/19/19 1246    Clinical Impression Statement  Patient reported it took her  husband 10 minutes to help her out of bed the other morning. Patient advised that at her age that no level of knee degeneration or back issues should cause her to not be able to move out of bed without 10 minutes of assist.. She was advised to seek medical attention if that happens. At this point the patients biggest hurdle is a general fear of moving. She performs all exercises asked of her but she is guarded and reports pain. She was advised she is going to  have to work through some pain. She reports taping helps. She had a limited session today because she was 10 minutes late. Therapy updated HER HE for more sitting exercises and strongly advised her to continue perfroming them at home.    Personal Factors and Comorbidities  Comorbidity 1;Comorbidity 2;Fitness    Comorbidities  obesity. lumbar fusion,    Examination-Activity Limitations  Bend;Lift;Locomotion Level;Sit;Squat;Stairs;Stand    Stability/Clinical Decision Making  Evolving/Moderate complexity    Clinical Decision Making  Moderate    Rehab Potential  Good    PT Duration  6 weeks    PT Treatment/Interventions  ADLs/Self Care Home Management;Cryotherapy;Electrical Stimulation;Iontophoresis 4mg /ml Dexamethasone;Gait training;Stair training;Functional mobility training;Therapeutic activities;Therapeutic exercise;Neuromuscular re-education;Patient/family education;Manual techniques;Passive range of motion;Taping;Dry needling;Spinal Manipulations;Joint Manipulations    PT Next Visit Plan  Patient can not lie supine for very long. She does best in side lying and sitting. egin with postural correction; consdier supine activity on a wedge. Has limited hip mobility; soft tissue work wot ht elumbar spine and hip; continue taping of the knee if helpful; quad sets ; glut sets; PPT; modalities PRN. continue to get her to exercise.    PT Home Exercise Plan  limited by time and pain level: hamstring stretch  seated; seated hip abduction yellow    Consulted and Agree with Plan of Care  Patient       Patient will benefit from skilled therapeutic intervention in order to improve the following deficits and impairments:  Abnormal gait, Decreased endurance, Increased muscle spasms, Decreased activity tolerance, Decreased safety awareness, Pain, Postural dysfunction, Increased fascial restricitons, Decreased range of motion, Improper body mechanics, Difficulty walking  Visit Diagnosis: Abnormal posture  Chronic  bilateral low back pain with left-sided sciatica  Chronic pain of left knee  Muscle spasm of back  Muscle weakness (generalized)     Problem List Patient Active Problem List   Diagnosis Date Noted  . S/P lumbar spinal fusion 06/25/2015  . Lumbar radiculopathy, chronic 12/05/2013  . Tobacco abuse 05/03/2011  . Obesity (BMI 30-39.9) 09/04/2009  . Chronic low back pain 04/09/2009  . Depression, major 11/06/2008    Dessie Comaavid J Shriley Joffe PT DPT  01/19/2019, 12:52 PM  [facility] [address] [place], [place], [address] Phone: [phone]   Fax:  [phone]  Name: Deatra InaRaquel L Pfeifer MRN: [id number] Date of Birth: September 20, 1980

## 2019-01-23 ENCOUNTER — Ambulatory Visit: Payer: Medicaid Other | Admitting: Physical Therapy

## 2019-01-30 ENCOUNTER — Encounter: Payer: Self-pay | Admitting: Physical Therapy

## 2019-01-30 ENCOUNTER — Ambulatory Visit: Payer: Medicaid Other | Attending: Family Medicine | Admitting: Physical Therapy

## 2019-01-30 ENCOUNTER — Other Ambulatory Visit: Payer: Self-pay

## 2019-01-30 DIAGNOSIS — M25562 Pain in left knee: Secondary | ICD-10-CM | POA: Diagnosis not present

## 2019-01-30 DIAGNOSIS — R293 Abnormal posture: Secondary | ICD-10-CM | POA: Diagnosis not present

## 2019-01-30 DIAGNOSIS — M5442 Lumbago with sciatica, left side: Secondary | ICD-10-CM | POA: Diagnosis not present

## 2019-01-30 DIAGNOSIS — G8929 Other chronic pain: Secondary | ICD-10-CM | POA: Insufficient documentation

## 2019-01-30 DIAGNOSIS — M62830 Muscle spasm of back: Secondary | ICD-10-CM | POA: Diagnosis not present

## 2019-01-30 DIAGNOSIS — M6281 Muscle weakness (generalized): Secondary | ICD-10-CM | POA: Insufficient documentation

## 2019-01-31 NOTE — Therapy (Signed)
[facility] [address] [place], [place], [address] Phone: [phone]   Fax:  [phone]  Physical Therapy Treatment  Patient Details  Name: Carol Gross MRN: [id number] Date of Birth: 11-21-1980 Referring Provider (PT): Dr Candee Furbish    Encounter Date: 01/30/2019  PT End of Session - 01/30/19 1643    Visit Number  3    Number of Visits  12    Date for PT Re-Evaluation  02/15/19    Authorization Type  [employer]ad    PT Start Time  1635   Patient 4 minutes late   PT Stop Time  1715    PT Time Calculation (min)  40 min    Activity Tolerance  Patient tolerated treatment well    Behavior During Therapy  [facility] for tasks assessed/performed       Past Medical History:  Diagnosis Date  . Arthritis   . Back pain    sees ortho  . Carpal tunnel syndrome   . Chronic back pain   . Cocaine abuse ([facility]) 09/15/2012   Started in early 20's and persistent since that time.    . Depression   . Fibromyalgia    ?  Marland Kitchen Headache(784.0)   . Hypertension    previously on. d/c'd by dr over month ago  . Menorrhagia with irregular cycle 07/03/2014  . Shortness of breath    exertion    Past Surgical History:  Procedure Laterality Date  . ABDOMINAL EXPOSURE N/A 06/25/2015   Procedure: ABDOMINAL EXPOSURE;  Surgeon: Larina Earthly, MD;  Location: [facility];  Service: Vascular;  Laterality: N/A;  . ANTERIOR LUMBAR FUSION N/A 06/25/2015   Procedure: ANTERIOR LUMBAR INTERBODY  FUSION LUMBAR FIVE -SACRAL ONE;  Surgeon: Tia Alert, MD;  Location: [facility];  Service: Neurosurgery;  Laterality: N/A;  . LAMINECTOMY WITH POSTERIOR LATERAL ARTHRODESIS LEVEL 1 N/A 11/03/2016   Procedure: Posterior Lateral Fusion - Lumbar five-Sacrum one  with pedicle screw fixation;  Surgeon: Tia Alert, MD;  Location: [facility];  Service: Neurosurgery;  Laterality: N/A;  . LUMBAR LAMINECTOMY/DECOMPRESSION MICRODISCECTOMY  03/03/2012   Procedure: LUMBAR  LAMINECTOMY/DECOMPRESSION MICRODISCECTOMY 1 LEVEL;  Surgeon: Karn Cassis, MD;  Location: [facility];  Service: Neurosurgery;  Laterality: Left;  Left Lumbar five sacral one Diskectomy  . TUBAL LIGATION    . WISDOM TOOTH EXTRACTION      There were no vitals filed for this visit.  Subjective Assessment - 01/30/19 1640    Subjective  Patient reports that her knee continues to be an 8/10 pain and her hip is a 7/10. She dosent feel like the tape is helping. She has tried her stretches. Again the other day she felt like she could not get out of bed,    How long can you sit comfortably?  knee stiffens when she sits for too long    How long can you stand comfortably?  enough time to cook    How long can you walk comfortably?  can walk around the grocery store with pain    Diagnostic tests  CT: 08/04/2018 of lumbar spine: solid fusion    Patient Stated Goals  to have less pain    Currently in Pain?  Yes    Pain Score  7     Pain Location  Hip    Pain Orientation  Left    Pain Descriptors / Indicators  Aching    Pain Type  Chronic pain  Pain Onset  More than a month ago    Pain Frequency  Constant    Aggravating Factors   standing and walking    Pain Relieving Factors  rest    Multiple Pain Sites  Yes    Pain Score  8    Pain Location  Knee    Pain Orientation  Left    Pain Descriptors / Indicators  Aching    Pain Type  Chronic pain    Pain Onset  More than a month ago    Pain Frequency  Constant    Aggravating Factors   standing and waling    Pain Relieving Factors  rest    Effect of Pain on Daily Activities  difficulty walking                       [facility] Adult PT Treatment/Exercise - 01/31/19 0001      Lumbar Exercises: Stretches   Active Hamstring Stretch  3 reps;20 seconds    Active Hamstring Stretch Limitations  seated     Lower Trunk Rotation Limitations  x10     Piriformis Stretch Limitations  had patient put her foot on her leg with diffciutly       Lumbar Exercises: Seated   Other Seated Lumbar Exercises  seated clamshell yellow 2x10, ball squeeze 2x10       Lumbar Exercises: Supine   Other Supine Lumbar Exercises  quad set 3x10 bilateral SAQ 2x10      Knee/Hip Exercises: Standing   Heel Raises Limitations  foward shifting 2x10     Hip Flexion Limitations  standing moving foot forward 2x10. Patient reported paiin       Manual Therapy   Manual Traction  attempted ALD but patient had difficulty              PT Education - 01/30/19 1643    Education Details  HEP and symptom management    Person(s) Educated  Patient    Methods  Explanation;Demonstration;Tactile cues;Verbal cues    Comprehension  Verbalized understanding;Returned demonstration;Verbal cues required;Tactile cues required       PT Short Term Goals - 01/31/19 1449      PT SHORT TERM GOAL #1   Title  She will be independent with initial HEP    Baseline  no HEP    Time  3    Period  Weeks    Status  On-going      PT SHORT TERM GOAL #2   Title  Patient will increase lumbar flexion by 25%    Baseline  50% limited    Time  3    Period  Weeks    Status  On-going      PT SHORT TERM GOAL #3   Title  Patient will increase left hip flexion by 30 degrees    Time  3    Period  Weeks    Status  On-going        PT Long Term Goals - 01/05/19 9470      PT LONG TERM GOAL #1   Title  Patient will stand for 1 hour without increased lumbar pain in order to perfrom ADL    Baseline  can noit stand for more then 20 min without pain    Time  6    Period  Weeks    Status  New    Target Date  02/16/19      PT LONG TERM GOAL #  2   Title  Patint wiill lie supine without increased pain    Time  6    Period  Weeks    Status  New    Target Date  02/16/19            Plan - 01/30/19 1644    Clinical Impression Statement  Patient will return to her MD. She continues to have high levels of pain in her hip and back and is not responding to exercises, stretching  or manual therapy. She is hesitant to do basic exercises like lower trunk rotation. She was able to complete quad sets and SAQ's. She was also given standing exercises. She reported pain with low range forward flexion standing. She had a significant increase in pain with LAD.    Personal Factors and Comorbidities  Comorbidity 1;Comorbidity 2;Fitness    Comorbidities  obesity. lumbar fusion,    Examination-Activity Limitations  Bend;Lift;Locomotion Level;Sit;Squat;Stairs;Stand    Stability/Clinical Decision Making  Evolving/Moderate complexity    Clinical Decision Making  Moderate    Rehab Potential  Good    PT Frequency  2x / week    PT Duration  6 weeks    PT Treatment/Interventions  ADLs/Self Care Home Management;Cryotherapy;Electrical Stimulation;Iontophoresis 4mg /ml Dexamethasone;Gait training;Stair training;Functional mobility training;Therapeutic activities;Therapeutic exercise;Neuromuscular re-education;Patient/family education;Manual techniques;Passive range of motion;Taping;Dry needling;Spinal Manipulations;Joint Manipulations    PT Next Visit Plan  continue to rpogress exercises as tolerated.    PT Home Exercise Plan  limited by time and pain level: hamstring stretch  seated; seated hip abduction yellow       Patient will benefit from skilled therapeutic intervention in order to improve the following deficits and impairments:  Abnormal gait, Decreased endurance, Increased muscle spasms, Decreased activity tolerance, Decreased safety awareness, Pain, Postural dysfunction, Increased fascial restricitons, Decreased range of motion, Improper body mechanics, Difficulty walking  Visit Diagnosis: Abnormal posture  Chronic bilateral low back pain with left-sided sciatica  Chronic pain of left knee  Muscle spasm of back  Muscle weakness (generalized)     Problem List Patient Active Problem List   Diagnosis Date Noted  . S/P lumbar spinal fusion 06/25/2015  . Lumbar radiculopathy,  chronic 12/05/2013  . Tobacco abuse 05/03/2011  . Obesity (BMI 30-39.9) 09/04/2009  . Chronic low back pain 04/09/2009  . Depression, major 11/06/2008    Dessie Comaavid J Darshawn Boateng PT DPT  01/31/2019, 2:50 PM  [facility] [address] [place], [place], [address] Phone: [phone]   Fax:  [phone]  Name: Carol Gross MRN: [id number] Date of Birth: October 04, 1980

## 2019-02-08 ENCOUNTER — Encounter: Payer: Self-pay | Admitting: Physical Therapy

## 2019-02-08 ENCOUNTER — Ambulatory Visit: Payer: Medicaid Other | Admitting: Physical Therapy

## 2019-02-08 ENCOUNTER — Other Ambulatory Visit: Payer: Self-pay

## 2019-02-08 DIAGNOSIS — M6281 Muscle weakness (generalized): Secondary | ICD-10-CM

## 2019-02-08 DIAGNOSIS — M25562 Pain in left knee: Secondary | ICD-10-CM | POA: Diagnosis not present

## 2019-02-08 DIAGNOSIS — R293 Abnormal posture: Secondary | ICD-10-CM | POA: Diagnosis not present

## 2019-02-08 DIAGNOSIS — M62830 Muscle spasm of back: Secondary | ICD-10-CM | POA: Diagnosis not present

## 2019-02-08 DIAGNOSIS — G8929 Other chronic pain: Secondary | ICD-10-CM | POA: Diagnosis not present

## 2019-02-08 DIAGNOSIS — M5442 Lumbago with sciatica, left side: Secondary | ICD-10-CM | POA: Diagnosis not present

## 2019-02-09 NOTE — Therapy (Signed)
[place], [place], [address] Phone: [phone]   Fax:  [phone]  Physical Therapy Treatment  Patient Details  Name: Carol Gross MRN: [id number] Date of Birth: August 23, 1980 Referring Provider (PT): Dr Trenda Moots    Encounter Date: 02/08/2019  PT End of Session - 02/08/19 1658    Visit Number  4    Number of Visits  12    Date for PT Re-Evaluation  02/15/19    Authorization Type  [employer]    PT Start Time  1638   Pastient 8 minutes late   PT Stop Time  1717    PT Time Calculation (min)  39 min    Activity Tolerance  Patient tolerated treatment well    Behavior During Therapy  [facility] for tasks assessed/performed       Past Medical History:  Diagnosis Date  . Arthritis   . Back pain    sees ortho  . Carpal tunnel syndrome   . Chronic back pain   . Cocaine abuse ([place]) 09/15/2012   Started in early 20's and persistent since that time.    . Depression   . Fibromyalgia    ?  Marland Kitchen Headache(784.0)   . Hypertension    previously on. d/c'd by dr over month ago  . Menorrhagia with irregular cycle 07/03/2014  . Shortness of breath    exertion    Past Surgical History:  Procedure Laterality Date  . ABDOMINAL EXPOSURE N/A 06/25/2015   Procedure: ABDOMINAL EXPOSURE;  Surgeon: Rosetta Posner, MD;  Location: [facility];  Service: Vascular;  Laterality: N/A;  . ANTERIOR LUMBAR FUSION N/A 06/25/2015   Procedure: ANTERIOR LUMBAR INTERBODY  FUSION LUMBAR FIVE -SACRAL ONE;  Surgeon: Eustace Moore, MD;  Location: [facility];  Service: Neurosurgery;  Laterality: N/A;  . LAMINECTOMY WITH POSTERIOR LATERAL ARTHRODESIS LEVEL 1 N/A 11/03/2016   Procedure: Posterior Lateral Fusion - Lumbar five-Sacrum one  with pedicle screw fixation;  Surgeon: Eustace Moore, MD;  Location: [facility];  Service: Neurosurgery;  Laterality: N/A;  . LUMBAR LAMINECTOMY/DECOMPRESSION MICRODISCECTOMY  03/03/2012   Procedure: LUMBAR  LAMINECTOMY/DECOMPRESSION MICRODISCECTOMY 1 LEVEL;  Surgeon: Floyce Stakes, MD;  Location: [facility];  Service: Neurosurgery;  Laterality: Left;  Left Lumbar five sacral one Diskectomy  . TUBAL LIGATION    . WISDOM TOOTH EXTRACTION      There were no vitals filed for this visit.  Subjective Assessment - 02/08/19 1645    Subjective  Patient reports her left ankle is hurting her today. This is something new. She continues to have the same pain in her knee and lower back. She is trying to do her exercises at home. She is now starting to have pain in the right knee as well.    How long can you sit comfortably?  knee stiffens when she sits for too long    How long can you stand comfortably?  enough time to cook    How long can you walk comfortably?  can walk around the grocery store with pain    Patient Stated Goals  to have less pain    Currently in Pain?  Yes    Pain Score  9     Pain Location  Knee    Pain Orientation  Left    Pain Descriptors / Indicators  Aching    Pain Type  Chronic pain    Pain Onset  More than a month  ago    Pain Frequency  Constant    Aggravating Factors   standing, walking    Pain Relieving Factors  rest    Pain Score  8    Pain Location  Ankle    Pain Orientation  Left    Pain Descriptors / Indicators  Aching    Pain Type  Acute pain    Pain Onset  In the past 7 days    Pain Frequency  Intermittent    Aggravating Factors   standing and walking    Pain Relieving Factors  rest    Effect of Pain on Daily Activities  diffiulty walking         [facility] PT Assessment - 02/09/19 0001      Assessment   Medical Diagnosis  Low back Pain and Left Knee Pain                    [facility] Adult PT Treatment/Exercise - 02/09/19 0001      High Level Balance   High Level Balance Comments  talked to patient about deep breathing and meditation. Talked to patient about strategies to keep thoughts of pain under control. Reviewed curable app for her phone that  gives infomation on chronic pain.       Lumbar Exercises: Stretches   Active Hamstring Stretch  3 reps;20 seconds    Active Hamstring Stretch Limitations  seated     Lower Trunk Rotation Limitations  x10       Lumbar Exercises: Supine   Other Supine Lumbar Exercises  quad set 3x10 bilateral SAQ 2x10    Other Supine Lumbar Exercises  SAQ 2x10       Knee/Hip Exercises: Standing   Heel Raises Limitations  foward shifting 2x10     Hip Flexion Limitations  standing moving foot forward 2x10. Patient reported paiin       Manual Therapy   Manual therapy comments  limited tolerance to soft tissue mobilization     Manual Traction  LAD 3x20 sec hold              PT Education - 02/08/19 1657    Education Details  reviewed pain-neuro education    Person(s) Educated  Patient    Methods  Explanation;Demonstration;Tactile cues;Verbal cues    Comprehension  Verbalized understanding;Returned demonstration;Verbal cues required;Tactile cues required       PT Short Term Goals - 01/31/19 1449      PT SHORT TERM GOAL #1   Title  She will be independent with initial HEP    Baseline  no HEP    Time  3    Period  Weeks    Status  On-going      PT SHORT TERM GOAL #2   Title  Patient will increase lumbar flexion by 25%    Baseline  50% limited    Time  3    Period  Weeks    Status  On-going      PT SHORT TERM GOAL #3   Title  Patient will increase left hip flexion by 30 degrees    Time  3    Period  Weeks    Status  On-going        PT Long Term Goals - 01/05/19 16100738      PT LONG TERM GOAL #1   Title  Patient will stand for 1 hour without increased lumbar pain in order to perfrom ADL    Baseline  can  noit stand for more then 20 min without pain    Time  6    Period  Weeks    Status  New    Target Date  02/16/19      PT LONG TERM GOAL #2   Title  Patint wiill lie supine without increased pain    Time  6    Period  Weeks    Status  New    Target Date  02/16/19             Plan - 02/08/19 1658    Clinical Impression Statement  Patient continues to have low tolerance to basic exercises. She had increased pain with SAQ and quad sets. She had burning in her calf on the right side with a quad set. She did find some releif with physioball roll out but she showed signs of pain in her shoulders. Therapy reviewed HEP/syptom mangement.  We could not find much else that she could do. She was gaurded with manual therapy. At this point the patient will be put on hold. She feels there may be something mechanical in her back or hip. She will be checked out by the MD on Monday.    Personal Factors and Comorbidities  Comorbidity 1;Comorbidity 2;Fitness    Comorbidities  obesity. lumbar fusion,    Examination-Activity Limitations  Bend;Lift;Locomotion Level;Sit;Squat;Stairs;Stand    Examination-Participation Restrictions  Shop;Meal Prep;Driving;Community Activity    Stability/Clinical Decision Making  Evolving/Moderate complexity    Clinical Decision Making  Moderate    Rehab Potential  Good    PT Frequency  2x / week    PT Treatment/Interventions  ADLs/Self Care Home Management;Cryotherapy;Electrical Stimulation;Iontophoresis 4mg /ml Dexamethasone;Gait training;Stair training;Functional mobility training;Therapeutic activities;Therapeutic exercise;Neuromuscular re-education;Patient/family education;Manual techniques;Passive range of motion;Taping;Dry needling;Spinal Manipulations;Joint Manipulations    PT Next Visit Plan  continue to rpogress exercises as tolerated. Hold.    PT Home Exercise Plan  limited by time and pain level: hamstring stretch  seated; seated hip abduction yellow    Consulted and Agree with Plan of Care  Patient       Patient will benefit from skilled therapeutic intervention in order to improve the following deficits and impairments:  Abnormal gait, Decreased endurance, Increased muscle spasms, Decreased activity tolerance, Decreased safety  awareness, Pain, Postural dysfunction, Increased fascial restricitons, Decreased range of motion, Improper body mechanics, Difficulty walking  Visit Diagnosis: Abnormal posture  Chronic bilateral low back pain with left-sided sciatica  Chronic pain of left knee  Muscle spasm of back  Muscle weakness (generalized)     Problem List Patient Active Problem List   Diagnosis Date Noted  . S/P lumbar spinal fusion 06/25/2015  . Lumbar radiculopathy, chronic 12/05/2013  . Tobacco abuse 05/03/2011  . Obesity (BMI 30-39.9) 09/04/2009  . Chronic low back pain 04/09/2009  . Depression, major 11/06/2008    01/06/2009 PT DPT  02/09/2019, 10:10 AM  [facility] [address] [place], [place], [place] Phone: [phone]   Fax:  [phone]  Name: Carol Gross MRN: [name] Date of Birth: 1980/11/02

## 2019-02-12 ENCOUNTER — Ambulatory Visit (INDEPENDENT_AMBULATORY_CARE_PROVIDER_SITE_OTHER): Payer: Medicaid Other | Admitting: Family Medicine

## 2019-02-12 ENCOUNTER — Encounter: Payer: Self-pay | Admitting: Family Medicine

## 2019-02-12 ENCOUNTER — Other Ambulatory Visit: Payer: Self-pay

## 2019-02-12 VITALS — BP 122/64 | HR 74 | Ht 67.0 in | Wt 233.4 lb

## 2019-02-12 DIAGNOSIS — M25511 Pain in right shoulder: Secondary | ICD-10-CM

## 2019-02-12 DIAGNOSIS — Z720 Tobacco use: Secondary | ICD-10-CM | POA: Diagnosis not present

## 2019-02-12 DIAGNOSIS — F324 Major depressive disorder, single episode, in partial remission: Secondary | ICD-10-CM | POA: Diagnosis not present

## 2019-02-12 DIAGNOSIS — G8929 Other chronic pain: Secondary | ICD-10-CM

## 2019-02-12 DIAGNOSIS — M25552 Pain in left hip: Secondary | ICD-10-CM | POA: Diagnosis not present

## 2019-02-12 DIAGNOSIS — M545 Low back pain, unspecified: Secondary | ICD-10-CM

## 2019-02-12 DIAGNOSIS — M25562 Pain in left knee: Secondary | ICD-10-CM | POA: Diagnosis not present

## 2019-02-12 DIAGNOSIS — M5416 Radiculopathy, lumbar region: Secondary | ICD-10-CM | POA: Diagnosis not present

## 2019-02-12 MED ORDER — DICLOFENAC SODIUM 1 % EX GEL: 2.0000 g | Freq: Four times a day (QID) | CUTANEOUS | 1 refills | Status: AC | PRN

## 2019-02-12 NOTE — Progress Notes (Signed)
Subjective  CC: hip and knee pain  Carol Gross is a 38 y.o. female who presents today with the following problems:  Left hip and knee pain  Cramping in the upper and lower leg. Having trouble sleeping. Pain is constantly there. When standing from sitting, it hurts. Pops when she is going to sit. Pain improves with extension. No falls of trauma to the knee. Never has had left knee pain in the past.   Right shoulder pain  Patient reports right shoulder anteriorly.  The pain is located in the anterior axillary area.  She reports tenderness to palpation and constant aching.  She denies anything that makes it worse or better.  She reports that this has been here for a couple of weeks.  Pertinent PM/FHx: tobacco abuse, depression, obesity  Social History   Tobacco Use  . Smoking status: Current Every Day Smoker    Packs/day: 0.30    Years: 10.00    Pack years: 3.00    Types: Cigarettes  . Smokeless tobacco: Never Used  . Tobacco comment: decreased smoking  Substance Use Topics  . Alcohol use: Yes    Alcohol/week: 0.0 standard drinks    Comment: occasional/social   ROS: Pertinent ROS included in HPI. Objective  Physical Exam:  BP 122/64   Pulse 74   Ht 5\' 7"  (1.702 m)   Wt 233 lb 6.4 oz (105.9 kg)   SpO2 98%   BMI 36.56 kg/m  General: Well-appearing female no acute distress MSK: Tenderness to palpation of right anterior chest bordered by clavicle superiorly, axilla laterally, pectoralis major inferiorly over soft tissue area. She has mild active decreased range of movement to shoulder extension.  There is no crepitus or bony malformations.  No obvious anatomical abnormalities.  Bra strap lies tightly against shoulder.  Assessment & Plan    Problem List Items Addressed This Visit      Active Problems   Depression, major    Patient would benefit from depression follow-up.  Did not have enough time to address today.      Chronic low back pain   Relevant Orders   Ambulatory referral to Sports Medicine   Tobacco abuse - Primary    Did discussed tobacco cessation at length today.  Patient no smoking is bad for her but does not want any help at this time.  We will continue to provide education at visits.      Lumbar radiculopathy, chronic   Relevant Orders   Ambulatory referral to Sports Medicine   Right shoulder pain    Patient reports right shoulder pain that started a couple of weeks ago.  She has never had this pain before.  Patient has large breasts and her bra strap lies tightly against her shoulder.  I suspect that this is certainly adding to if not the cause of shoulder pain.  There are no other physical exam abnormalities.  Will provide Voltaren gel and counseled patient on bra strap adding extra stress to shoulder.      Relevant Orders   Ambulatory referral to Sports Medicine   Hip pain, chronic, left    Patient has chronic left-sided hip and knee pain.  She did not tolerate physical therapy.  I received a message from the physical therapist who reported that she had difficulty with any movements.  He thought that physical therapy likely was not the best solution for patient at this time.  Patient is very young and otherwise has normal gait and  no obvious issues with ambulation.  I wonder how difficult it is for her to do physical therapy exercises and if she would benefit from cognitive behavioral therapy or counseling.  For now, I am sending patient referral to sports medicine as she may benefit from further identification of low back pain, hip and knee pain.  I have also again told patient to follow-up with her neurosurgeon as she continues to have low back pain at her surgical site.  I have also discussed in depth about obesity and smoking with all of her chronic pain.  Strongly suggested tobacco cessation and weight loss.       Other Visit Diagnoses    Acute pain of left knee       Relevant Orders   Ambulatory referral to Sports Medicine        Wilber Oliphant, M.D.  5:26 PM 02/18/2019

## 2019-02-12 NOTE — Patient Instructions (Signed)
Dear Tenna Delaine,   It was good to see you! Thank you for taking your time to come in to be seen. Today, we discussed the following:   hip and knee pain   Referring to sports medicine   Use Voltaren gel as needed for pain   Please follow up with your neurosurgeon for back pain   Be well,   Zettie Cooley, M.D   [place] [phone]  *Sign up for MyChart for instant access to your health profile, labs, orders, upcoming appointments or to contact your provider with questions*  ===================================================================================

## 2019-02-15 ENCOUNTER — Ambulatory Visit: Payer: Medicaid Other | Admitting: Physical Therapy

## 2019-02-18 ENCOUNTER — Encounter: Payer: Self-pay | Admitting: Family Medicine

## 2019-02-18 DIAGNOSIS — G8929 Other chronic pain: Secondary | ICD-10-CM | POA: Insufficient documentation

## 2019-02-18 DIAGNOSIS — M546 Pain in thoracic spine: Secondary | ICD-10-CM | POA: Insufficient documentation

## 2019-02-18 NOTE — Assessment & Plan Note (Signed)
Patient has chronic left-sided hip and knee pain.  She did not tolerate physical therapy.  I received a message from the physical therapist who reported that she had difficulty with any movements.  He thought that physical therapy likely was not the best solution for patient at this time.  Patient is very young and otherwise has normal gait and no obvious issues with ambulation.  I wonder how difficult it is for her to do physical therapy exercises and if she would benefit from cognitive behavioral therapy or counseling.  For now, I am sending patient referral to sports medicine as she may benefit from further identification of low back pain, hip and knee pain.  I have also again told patient to follow-up with her neurosurgeon as she continues to have low back pain at her surgical site.  I have also discussed in depth about obesity and smoking with all of her chronic pain.  Strongly suggested tobacco cessation and weight loss.

## 2019-02-18 NOTE — Assessment & Plan Note (Signed)
Did discussed tobacco cessation at length today.  Patient no smoking is bad for her but does not want any help at this time.  We will continue to provide education at visits.

## 2019-02-18 NOTE — Assessment & Plan Note (Signed)
Patient reports right shoulder pain that started a couple of weeks ago.  She has never had this pain before.  Patient has large breasts and her bra strap lies tightly against her shoulder.  I suspect that this is certainly adding to if not the cause of shoulder pain.  There are no other physical exam abnormalities.  Will provide Voltaren gel and counseled patient on bra strap adding extra stress to shoulder.

## 2019-02-18 NOTE — Assessment & Plan Note (Signed)
Patient would benefit from depression follow-up.  Did not have enough time to address today.

## 2019-04-21 ENCOUNTER — Emergency Department (HOSPITAL_COMMUNITY): Payer: Medicaid Other

## 2019-04-21 ENCOUNTER — Other Ambulatory Visit: Payer: Self-pay

## 2019-04-21 ENCOUNTER — Emergency Department (HOSPITAL_COMMUNITY)
Admission: EM | Admit: 2019-04-21 | Discharge: 2019-04-21 | Disposition: A | Discharge status: Home / Self Care | Payer: Medicaid Other | Attending: Emergency Medicine | Admitting: Emergency Medicine

## 2019-04-21 DIAGNOSIS — M79671 Pain in right foot: Secondary | ICD-10-CM | POA: Insufficient documentation

## 2019-04-21 DIAGNOSIS — Y999 Unspecified external cause status: Secondary | ICD-10-CM | POA: Diagnosis not present

## 2019-04-21 DIAGNOSIS — I10 Essential (primary) hypertension: Secondary | ICD-10-CM | POA: Insufficient documentation

## 2019-04-21 DIAGNOSIS — Z79899 Other long term (current) drug therapy: Secondary | ICD-10-CM | POA: Diagnosis not present

## 2019-04-21 DIAGNOSIS — M25561 Pain in right knee: Secondary | ICD-10-CM | POA: Insufficient documentation

## 2019-04-21 DIAGNOSIS — R52 Pain, unspecified: Secondary | ICD-10-CM | POA: Diagnosis not present

## 2019-04-21 DIAGNOSIS — S4991XA Unspecified injury of right shoulder and upper arm, initial encounter: Secondary | ICD-10-CM | POA: Diagnosis not present

## 2019-04-21 DIAGNOSIS — R600 Localized edema: Secondary | ICD-10-CM | POA: Insufficient documentation

## 2019-04-21 DIAGNOSIS — Y92410 Unspecified street and highway as the place of occurrence of the external cause: Secondary | ICD-10-CM | POA: Diagnosis not present

## 2019-04-21 DIAGNOSIS — Y9389 Activity, other specified: Secondary | ICD-10-CM | POA: Insufficient documentation

## 2019-04-21 DIAGNOSIS — M25571 Pain in right ankle and joints of right foot: Secondary | ICD-10-CM | POA: Insufficient documentation

## 2019-04-21 DIAGNOSIS — S99921A Unspecified injury of right foot, initial encounter: Secondary | ICD-10-CM | POA: Diagnosis not present

## 2019-04-21 DIAGNOSIS — S199XXA Unspecified injury of neck, initial encounter: Secondary | ICD-10-CM | POA: Diagnosis not present

## 2019-04-21 DIAGNOSIS — M549 Dorsalgia, unspecified: Secondary | ICD-10-CM | POA: Diagnosis not present

## 2019-04-21 DIAGNOSIS — S8991XA Unspecified injury of right lower leg, initial encounter: Secondary | ICD-10-CM | POA: Diagnosis not present

## 2019-04-21 DIAGNOSIS — M25519 Pain in unspecified shoulder: Secondary | ICD-10-CM | POA: Diagnosis not present

## 2019-04-21 DIAGNOSIS — M25511 Pain in right shoulder: Secondary | ICD-10-CM | POA: Diagnosis not present

## 2019-04-21 DIAGNOSIS — M542 Cervicalgia: Secondary | ICD-10-CM | POA: Insufficient documentation

## 2019-04-21 DIAGNOSIS — F17210 Nicotine dependence, cigarettes, uncomplicated: Secondary | ICD-10-CM | POA: Insufficient documentation

## 2019-04-21 DIAGNOSIS — M7918 Myalgia, other site: Secondary | ICD-10-CM

## 2019-04-21 MED ORDER — HYDROCODONE-ACETAMINOPHEN 5-325 MG PO TABS
1.0000 | ORAL_TABLET | Freq: Once | ORAL | Status: AC
  Administered 2019-04-21: 18:00:00 1 via ORAL
  Filled 2019-04-21: qty 1

## 2019-04-21 MED ORDER — METHOCARBAMOL 500 MG PO TABS: 500.0000 mg | ORAL_TABLET | Freq: Two times a day (BID) | ORAL | 0 refills | Status: AC

## 2019-04-21 MED ORDER — NAPROXEN 500 MG PO TABS: 500.0000 mg | ORAL_TABLET | Freq: Two times a day (BID) | ORAL | 0 refills | Status: DC
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
## 2019-04-21 NOTE — Discharge Instructions (Addendum)
Your images did not show any signs of fractures today.  Please pick up medications and take as prescribed. DO NOT DRIVE WHILE ON THE MUSCLE RELAXER AS IT CAN MAKE YOU DROWSY. It is recommended to take the muscle relaxer at nighttime to help you sleep if you need to be more alert during the day and to take the antiinflammatory during the day.  Follow up with your PCP regarding your ED visit today.  Return to the ED IMMEDIATELY for any worsening symptoms including worsening pain, chest pain, shortness of breath, abdominal pain, vomiting, blurry vision/double vision, confusion, or any other concerning symptoms.

## 2019-04-26 ENCOUNTER — Other Ambulatory Visit: Payer: Self-pay

## 2019-04-26 ENCOUNTER — Encounter: Payer: Self-pay | Admitting: Family Medicine

## 2019-04-26 ENCOUNTER — Ambulatory Visit (INDEPENDENT_AMBULATORY_CARE_PROVIDER_SITE_OTHER): Payer: Medicaid Other | Admitting: Family Medicine

## 2019-04-26 VITALS — BP 128/72 | HR 97 | Wt 240.2 lb

## 2019-04-26 DIAGNOSIS — M545 Low back pain: Secondary | ICD-10-CM | POA: Diagnosis not present

## 2019-04-26 DIAGNOSIS — G8929 Other chronic pain: Secondary | ICD-10-CM

## 2019-04-26 MED ORDER — METHYLPREDNISOLONE 4 MG PO TBPK: ORAL_TABLET | ORAL | 0 refills | Status: DC

## 2019-04-26 MED ORDER — MELOXICAM 7.5 MG PO TABS: 7.5000 mg | ORAL_TABLET | Freq: Every day | ORAL | 0 refills | Status: DC

## 2019-04-26 MED ORDER — ACETAMINOPHEN 500 MG PO TABS: 1000.0000 mg | ORAL_TABLET | Freq: Four times a day (QID) | ORAL | 0 refills | Status: AC | PRN
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
## 2019-04-26 NOTE — Patient Instructions (Addendum)
Carol Gross,  It was lovely to meet you today!! I am sorry that your back pain is causing you trouble. I have prescribed you a short course of steroids. You can also take meloxicam once daily with tylenol every 6 hours. Please come back and see [place] in 2-4 weeks to see how you're getting on. We can consider imaging your spine with an MRI and consider referring you orthopedics.  I hope you feel better soon,  Best wishes,  Dr Allena Katz
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
## 2019-05-09 ENCOUNTER — Ambulatory Visit: Payer: Medicaid Other | Attending: Family Medicine

## 2019-05-09 ENCOUNTER — Other Ambulatory Visit: Payer: Self-pay

## 2019-05-09 DIAGNOSIS — M6281 Muscle weakness (generalized): Secondary | ICD-10-CM | POA: Insufficient documentation

## 2019-05-09 DIAGNOSIS — M25562 Pain in left knee: Secondary | ICD-10-CM | POA: Diagnosis present

## 2019-05-09 DIAGNOSIS — M62830 Muscle spasm of back: Secondary | ICD-10-CM | POA: Diagnosis present

## 2019-05-09 DIAGNOSIS — M5442 Lumbago with sciatica, left side: Secondary | ICD-10-CM | POA: Insufficient documentation

## 2019-05-09 DIAGNOSIS — G8929 Other chronic pain: Secondary | ICD-10-CM

## 2019-05-09 DIAGNOSIS — R293 Abnormal posture: Secondary | ICD-10-CM

## 2019-05-09 DIAGNOSIS — M546 Pain in thoracic spine: Secondary | ICD-10-CM | POA: Insufficient documentation
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
## 2019-05-22 ENCOUNTER — Ambulatory Visit: Payer: Medicaid Other

## 2019-05-22 ENCOUNTER — Other Ambulatory Visit: Payer: Self-pay

## 2019-05-22 DIAGNOSIS — M546 Pain in thoracic spine: Secondary | ICD-10-CM

## 2019-05-22 DIAGNOSIS — G8929 Other chronic pain: Secondary | ICD-10-CM

## 2019-05-22 DIAGNOSIS — R293 Abnormal posture: Secondary | ICD-10-CM

## 2019-05-22 DIAGNOSIS — M25562 Pain in left knee: Secondary | ICD-10-CM

## 2019-05-22 DIAGNOSIS — M62830 Muscle spasm of back: Secondary | ICD-10-CM

## 2019-05-22 DIAGNOSIS — M6281 Muscle weakness (generalized): Secondary | ICD-10-CM

## 2019-05-22 NOTE — Therapy (Signed)
[facility] Outpatient Rehabilitation [facility] [address] [place], [place], [address] Phone: [phone]   Fax:  [phone]  Physical Therapy Treatment  Patient Details  Name: Carol Gross MRN: [id number] Date of Birth: 01-10-1981 Referring Provider (PT): Clifton Custard, MD   Encounter Date: 05/22/2019  PT End of Session - 05/22/19 1503    Visit Number  2    Number of Visits  17    Authorization - Number of Visits  3    Progress Note Due on Visit  0    PT Start Time  1408    PT Stop Time  1446    PT Time Calculation (min)  38 min    Activity Tolerance  Patient tolerated treatment well    Behavior During Therapy  [facility] for tasks assessed/performed       Past Medical History:  Diagnosis Date  . Arthritis   . Back pain    sees ortho  . Carpal tunnel syndrome   . Chronic back pain   . Cocaine abuse (HCC) 09/15/2012   Started in early 20's and persistent since that time.    . Depression   . Fibromyalgia    ?  Marland Kitchen Headache(784.0)   . Hypertension    previously on. d/c'd by dr over month ago  . Menorrhagia with irregular cycle 07/03/2014  . Shortness of breath    exertion    Past Surgical History:  Procedure Laterality Date  . ABDOMINAL EXPOSURE N/A 06/25/2015   Procedure: ABDOMINAL EXPOSURE;  Surgeon: Larina Earthly, MD;  [facility]: [facility];  Service: Vascular;  Laterality: N/A;  . ANTERIOR LUMBAR FUSION N/A 06/25/2015   Procedure: ANTERIOR LUMBAR INTERBODY  FUSION LUMBAR FIVE -SACRAL ONE;  Surgeon: Tia Alert, MD;  [facility]: [facility];  Service: Neurosurgery;  Laterality: N/A;  . LAMINECTOMY WITH POSTERIOR LATERAL ARTHRODESIS LEVEL 1 N/A 11/03/2016   Procedure: Posterior Lateral Fusion - Lumbar five-Sacrum one  with pedicle screw fixation;  Surgeon: Tia Alert, MD;  Location: [facility] OR;  Service: Neurosurgery;  Laterality: N/A;  . LUMBAR LAMINECTOMY/DECOMPRESSION MICRODISCECTOMY  03/03/2012   Procedure: LUMBAR LAMINECTOMY/DECOMPRESSION MICRODISCECTOMY 1  LEVEL;  Surgeon: Karn Cassis, MD;  [facility];  Service: Neurosurgery;  Laterality: Left;  Left Lumbar five sacral one Diskectomy  . TUBAL LIGATION    . WISDOM TOOTH EXTRACTION      There were no vitals filed for this visit.  Subjective Assessment - 05/22/19 1416    Subjective  Pt reports her midback R greater than L pain has not improved. Pt rates a 8/10 and going to her R shoulder.                       [facility] Adult PT Treatment/Exercise - 05/22/19 0001      Exercises   Exercises  Neck;Other Exercises    Other Exercises   Mid back rotation c arms crossed 10x; mid back stretch c arm anchor to door jam 2x10 sec; c deep breaths      Neck Exercises: Machines for Strengthening   Nustep  5 mins; level 1      Neck Exercises: Seated   Cervical Rotation  Both;10 reps    Other Seated Exercise  Seated SB c amr anchor to seat pan; 2x 10 sec; c deep breaths      Manual Therapy   Manual Therapy  Soft tissue mobilization   self STM post education:cane massager and tennis ball;10  min            PT Education - 05/22/19 1459    Education Details  Education on the pain cycle and use of positioing, massage, and exercise to interupt this cycle. Education for sleeping in side lying c pilllows for support. With R shoulder pain, canal set up was reviewed. Also, recommended support for the R UE in sitting to reduce starin and pain. Pt returned demonstration of all recommendations.    Person(s) Educated  Patient    Methods  Explanation;Demonstration;Tactile cues;Verbal cues;Handout    Comprehension  Verbalized understanding;Returned demonstration;Verbal cues required;Tactile cues required;Need further instruction       PT Short Term Goals - 05/10/19 1324      PT SHORT TERM GOAL #1   Title  Pt will be ind in an initial HEP to address pain and function.    Baseline  No established HEP    Time  3    Period  Weeks    Status  New    Target Date  05/31/19         PT Long Term Goals - 05/10/19 1326      PT LONG TERM GOAL #1   Title  Pt will be Ind c a final HEP to address pain and function.    Baseline  Not established    Time  7    Period  Weeks    Status  New    Target Date  06/28/19      PT LONG TERM GOAL #2   Title  Improve FOTO limitation rating to 41%    Baseline  Initial FOTO 63%    Time  7    Period  Weeks    Status  New    Target Date  06/28/19      PT LONG TERM GOAL #3   Title  Improve trunk flexion to 50d and L and R sidebending to 35d    Baseline  35d    Time  7    Period  Weeks    Status  New    Target Date  06/28/19      PT LONG TERM GOAL #4   Title  Pt will report improved central mid-back pain in a 3-6/10 range for functional activities vs 9/10 on eval.    Baseline  9/10    Time  7    Period  Weeks    Status  New    Target Date  06/28/19            Plan - 05/22/19 1506    Clinical Impression Statement  Pt is tender to palpation of the R mid back paraspinals and upper trapezius musculature. Pt demonstrated appropriate understanding of self stretches and massage tecniques to address her pain and muscle tension.    Personal Factors and Comorbidities  Comorbidity 2    Comorbidities  obesity. lumbar fusion, depression    PT Treatment/Interventions  ADLs/Self Care Home Management;Cryotherapy;Electrical Stimulation;Iontophoresis 4mg /ml Dexamethasone;Gait training;Stair training;Functional mobility training;Therapeutic activities;Therapeutic exercise;Neuromuscular re-education;Patient/family education;Manual techniques;Passive range of motion;Taping;Dry needling;Spinal Manipulations;Joint Manipulations    PT Next Visit Plan  Review response to self stretches and massage techniques. Complete modalities and manual therapy as indicated.    PT Home Exercise Plan  Seated cervical rotation; seated cervical side bending c anchored arm pulls and deep breaths; seated upper back rotation c arms crossed; mid back stretch c  anchored arm with door jam and deep breaths. 3-5 reps, 10 sec stretches.  bilat mid back trapezius areas and tennis ball massage to mid back against wall. 10 mins       Patient will benefit from skilled therapeutic intervention in order to improve the following deficits and impairments:  Abnormal gait, Decreased endurance, Increased muscle spasms, Decreased activity tolerance, Decreased safety awareness, Pain, Postural dysfunction, Increased fascial restricitons, Decreased range of motion, Improper body mechanics, Difficulty walking, Obesity  Visit Diagnosis: Chronic midline thoracic back pain  Abnormal posture  Muscle spasm of back  Chronic bilateral low back pain with left-sided sciatica  Chronic pain of left knee  Muscle weakness (generalized)     Problem List Patient Active Problem List   Diagnosis Date Noted  . Right shoulder pain 02/18/2019  . Hip pain, chronic, left 02/18/2019  . S/P lumbar spinal fusion 06/25/2015  . Lumbar radiculopathy, chronic 12/05/2013  . Tobacco abuse 05/03/2011  . Obesity (BMI 30-39.9) 09/04/2009  . Lumbar back pain 04/09/2009  . Depression, major 11/06/2008    Gar Ponto MS, PT 05/22/19 3:28 PM  [facility] [address] [place], [place], [address] Phone: [phone]   Fax:  [phone]  Name: Carol Gross MRN: [id number] Date of Birth: [birth year]

## 2019-05-22 NOTE — Patient Instructions (Signed)
Seated cervical rotation; seated cervical side bending c anchored arm pulls and deep breaths; seated upper back rotation c arms crossed; mid back stretch c anchored arm with door jam and deep breaths. 3-5 reps, 10 sec stretches.   Cane massager bilat mid back trapezius areas and tennis ball massage to mid back against wall. 10 mins

## 2019-05-24 ENCOUNTER — Ambulatory Visit: Payer: Medicaid Other

## 2019-05-24 ENCOUNTER — Other Ambulatory Visit: Payer: Self-pay

## 2019-05-24 DIAGNOSIS — M6281 Muscle weakness (generalized): Secondary | ICD-10-CM

## 2019-05-24 DIAGNOSIS — G8929 Other chronic pain: Secondary | ICD-10-CM

## 2019-05-24 DIAGNOSIS — M62830 Muscle spasm of back: Secondary | ICD-10-CM

## 2019-05-24 DIAGNOSIS — R293 Abnormal posture: Secondary | ICD-10-CM

## 2019-05-24 DIAGNOSIS — M25562 Pain in left knee: Secondary | ICD-10-CM

## 2019-05-24 DIAGNOSIS — M5442 Lumbago with sciatica, left side: Secondary | ICD-10-CM

## 2019-05-25 NOTE — Therapy (Signed)
[facility] Outpatient Rehabilitation [facility] [address] [place], [place], [address] Phone: [phone]   Fax:  [phone]  Physical Therapy Treatment  Patient Details  Name: Carol Gross MRN: [id number] Date of Birth: 1980-06-07 Referring Provider (PT): Clifton Custard, MD   Encounter Date: 05/24/2019  PT End of Session - 05/25/19 1318    Visit Number  3    Number of Visits  17    Date for PT Re-Evaluation  07/06/19    Authorization Type  Medicaid    Authorization Time Period  05/09/19-07/06/19    Authorization - Visit Number  2    Authorization - Number of Visits  3    PT Start Time  1404    PT Stop Time  1450    PT Time Calculation (min)  46 min    Activity Tolerance  Patient tolerated treatment well    Behavior During Therapy  [facility] for tasks assessed/performed       Past Medical History:  Diagnosis Date  . Arthritis   . Back pain    sees ortho  . Carpal tunnel syndrome   . Chronic back pain   . Cocaine abuse (HCC) 09/15/2012   Started in early 20's and persistent since that time.    . Depression   . Fibromyalgia    ?  Marland Kitchen Headache(784.0)   . Hypertension    previously on. d/c'd by dr over month ago  . Menorrhagia with irregular cycle 07/03/2014  . Shortness of breath    exertion    Past Surgical History:  Procedure Laterality Date  . ABDOMINAL EXPOSURE N/A 06/25/2015   Procedure: ABDOMINAL EXPOSURE;  Surgeon: Larina Earthly, MD;  [facility];  Service: Vascular;  Laterality: N/A;  . ANTERIOR LUMBAR FUSION N/A 06/25/2015   Procedure: ANTERIOR LUMBAR INTERBODY  FUSION LUMBAR FIVE -SACRAL ONE;  Surgeon: Tia Alert, MD;  Location: [facility];  Service: Neurosurgery;  Laterality: N/A;  . LAMINECTOMY WITH POSTERIOR LATERAL ARTHRODESIS LEVEL 1 N/A 11/03/2016   Procedure: Posterior Lateral Fusion - Lumbar five-Sacrum one  with pedicle screw fixation;  Surgeon: Tia Alert, MD;  Location: [facility] OR;  Service: Neurosurgery;  Laterality: N/A;  .  LUMBAR LAMINECTOMY/DECOMPRESSION MICRODISCECTOMY  03/03/2012   Procedure: LUMBAR LAMINECTOMY/DECOMPRESSION MICRODISCECTOMY 1 LEVEL;  Surgeon: Karn Cassis, MD;  [facility];  Service: Neurosurgery;  Laterality: Left;  Left Lumbar five sacral one Diskectomy  . TUBAL LIGATION    . WISDOM TOOTH EXTRACTION      There were no vitals filed for this visit.  Subjective Assessment - 05/24/19 1429    Subjective  Pt continues to have mid back pain R greater than L, radiating to the R shoulder. Pt rates this pain 7/10. Pt states recommendations of positioning for sleeping have been helpful.[facility] Adult PT Treatment/Exercise - 05/25/19 0001      Exercises   Exercises  Neck;Other Exercises    Other Exercises   Mid back rotation c arms crossed 10x; mid back stretch c arm anchor to door jam 2x10 sec; c deep breaths      Neck Exercises: Seated   Cervical Rotation  Both;10 reps    Other Seated Exercise  Seated SB c arm anchor to seat pan; 2x 10 sec; c deep breaths      Lumbar Exercises: Stretches   Other  Lumbar Stretch Exercise  mid back and lumbar sink stretch forward and laterally; 2x each direction; 20 sec      Manual Therapy   Manual Therapy  Soft tissue mobilization    Soft tissue mobilization  STM to the mid-back and lumbar paraspinals and interscapular musculature. TPs noted along the upper and lower medial border, R greate than L.              PT Education - 05/24/19 1433    Education Details  Use of a pillow in sitting to support and decrease stain on her R shoulder.    Person(s) Educated  Patient    Methods  Explanation;Demonstration    Comprehension  Verbalized understanding;Returned demonstration       PT Short Term Goals - 05/10/19 1324      PT SHORT TERM GOAL #1   Title  Pt will be ind in an initial HEP to address pain and function.    Baseline  No established HEP    Time  3    Period  Weeks    Status  New    Target  Date  05/31/19        PT Long Term Goals - 05/10/19 1326      PT LONG TERM GOAL #1   Title  Pt will be Ind c a final HEP to address pain and function.    Baseline  Not established    Time  7    Period  Weeks    Status  New    Target Date  06/28/19      PT LONG TERM GOAL #2   Title  Improve FOTO limitation rating to 41%    Baseline  Initial FOTO 63%    Time  7    Period  Weeks    Status  New    Target Date  06/28/19      PT LONG TERM GOAL #3   Title  Improve trunk flexion to 50d and L and R sidebending to 35d    Baseline  35d    Time  7    Period  Weeks    Status  New    Target Date  06/28/19      PT LONG TERM GOAL #4   Title  Pt will report improved central mid-back pain in a 3-6/10 range for functional activities vs 9/10 on eval.    Baseline  9/10    Time  7    Period  Weeks    Status  New    Target Date  06/28/19            Plan - 05/25/19 1307    Clinical Impression Statement  Pt esx to is continuing to report significant mid back and upper lumbar pain. Pt demonstrates appropriate understanding of exercise to assist c ROM, stretching, and self massage techniques.    PT Next Visit Plan  Initiate core stabilization exs as indicated. Assess pt's response to STM.       Patient will benefit from skilled therapeutic intervention in order to improve the following deficits and impairments:  Abnormal gait, Decreased endurance, Increased muscle spasms, Decreased activity tolerance, Decreased safety awareness, Pain, Postural dysfunction, Increased fascial restricitons, Decreased range of motion, Improper body mechanics, Difficulty walking, Obesity  Visit Diagnosis: Abnormal posture  Chronic midline thoracic back pain  Muscle spasm of back  Chronic bilateral low back pain with left-sided sciatica  Chronic pain of left knee  Muscle weakness (generalized)  Problem List Patient Active Problem List   Diagnosis Date Noted  . Right shoulder pain 02/18/2019   . Hip pain, chronic, left 02/18/2019  . S/P lumbar spinal fusion 06/25/2015  . Lumbar radiculopathy, chronic 12/05/2013  . Tobacco abuse 05/03/2011  . Obesity (BMI 30-39.9) 09/04/2009  . Lumbar back pain 04/09/2009  . Depression, major 11/06/2008    Joellyn Rued MS, PT 05/25/19 1:22 PM  [facility] Outpatient Rehabilitation [facility] [address] [place], [place], [address] Phone: [phone]   Fax:  [phone]  Name: Carol Gross MRN: [id number] Date of Birth: 02/04/1981

## 2019-05-25 NOTE — Patient Instructions (Signed)
Reviewed HEP for cervical rotation, mid back rotation; Crevical SB anchored stretch. Added sink stretches for the mid back/lumbar area.

## 2019-05-29 ENCOUNTER — Ambulatory Visit: Payer: Medicaid Other | Admitting: Family Medicine

## 2019-05-29 ENCOUNTER — Ambulatory Visit: Payer: Medicaid Other

## 2019-05-29 ENCOUNTER — Other Ambulatory Visit: Payer: Self-pay

## 2019-05-29 DIAGNOSIS — M25562 Pain in left knee: Secondary | ICD-10-CM

## 2019-05-29 DIAGNOSIS — G8929 Other chronic pain: Secondary | ICD-10-CM

## 2019-05-29 DIAGNOSIS — R293 Abnormal posture: Secondary | ICD-10-CM

## 2019-05-29 DIAGNOSIS — M546 Pain in thoracic spine: Secondary | ICD-10-CM | POA: Diagnosis not present

## 2019-05-29 DIAGNOSIS — M6281 Muscle weakness (generalized): Secondary | ICD-10-CM

## 2019-05-29 DIAGNOSIS — M62830 Muscle spasm of back: Secondary | ICD-10-CM

## 2019-05-30 ENCOUNTER — Encounter: Payer: Self-pay | Admitting: Family Medicine

## 2019-05-30 ENCOUNTER — Other Ambulatory Visit: Payer: Self-pay

## 2019-05-30 ENCOUNTER — Ambulatory Visit (INDEPENDENT_AMBULATORY_CARE_PROVIDER_SITE_OTHER): Payer: Medicaid Other | Admitting: Family Medicine

## 2019-05-30 VITALS — BP 118/82 | HR 66 | Ht 67.0 in | Wt 237.8 lb

## 2019-05-30 DIAGNOSIS — M25511 Pain in right shoulder: Secondary | ICD-10-CM

## 2019-05-30 NOTE — Patient Instructions (Signed)
It was a pleasure to see you today! Thank you for choosing [facility] for your primary care. Carol Gross was seen for right shoulder and upper back pain. Come back to the clinic if you are unable to connect with sports medicine.  Sports medicine: [phone]  Continue going to therapy and taking your scheduled tylenol/meloxicam    Please bring all your medications to every doctors visit   Sign up for My Chart to have easy access to your labs results, and communication with your Primary care physician.     Please check-out at the front desk before leaving the clinic.     Best,  Dr. Marthenia Rolling FAMILY MEDICINE RESIDENT - PGY3 05/30/2019 2:39 PM

## 2019-05-30 NOTE — Therapy (Signed)
[place], [place], [address] Phone: [phone]   Fax:  [phone]  Physical Therapy Treatment  Patient Details  Name: Carol Gross MRN: [id number] Date of Birth: [birth year] Referring Provider (PT): Fortunato Curling, MD   Encounter Date: 05/29/2019  PT End of Session - 05/29/19 1617    Visit Number  4    Number of Visits  17    Date for PT Re-Evaluation  07/06/19    Authorization Type  Medicaid    Authorization Time Period  --    Authorization - Visit Number  3    Authorization - Number of Visits  3    PT Start Time  1408    PT Stop Time  1501    PT Time Calculation (min)  53 min    Activity Tolerance  Patient tolerated treatment well    Behavior During Therapy  [facility] for tasks assessed/performed       Past Medical History:  Diagnosis Date  . Arthritis   . Back pain    sees ortho  . Carpal tunnel syndrome   . Chronic back pain   . Cocaine abuse ([place]) 09/15/2012   Started in early 20's and persistent since that time.    . Depression   . Fibromyalgia    ?  Marland Kitchen Headache(784.0)   . Hypertension    previously on. d/c'd by dr over month ago  . Menorrhagia with irregular cycle 07/03/2014  . Shortness of breath    exertion    Past Surgical History:  Procedure Laterality Date  . ABDOMINAL EXPOSURE N/A 06/25/2015   Procedure: ABDOMINAL EXPOSURE;  Surgeon: Rosetta Posner, MD;  Location: [facility];  Service: Vascular;  Laterality: N/A;  . ANTERIOR LUMBAR FUSION N/A 06/25/2015   Procedure: ANTERIOR LUMBAR INTERBODY  FUSION LUMBAR FIVE -SACRAL ONE;  Surgeon: Eustace Moore, MD;  Location: [place] NEURO ORS;  Service: Neurosurgery;  Laterality: N/A;  . LAMINECTOMY WITH POSTERIOR LATERAL ARTHRODESIS LEVEL 1 N/A 11/03/2016   Procedure: Posterior Lateral Fusion - Lumbar five-Sacrum one  with pedicle screw fixation;  Surgeon: Eustace Moore, MD;  Location: [place];  Service: Neurosurgery;  Laterality: N/A;  . LUMBAR  LAMINECTOMY/DECOMPRESSION MICRODISCECTOMY  03/03/2012   Procedure: LUMBAR LAMINECTOMY/DECOMPRESSION MICRODISCECTOMY 1 LEVEL;  Surgeon: Floyce Stakes, MD;  Location: [facility];  Service: Neurosurgery;  Laterality: Left;  Left Lumbar five sacral one Diskectomy  . TUBAL LIGATION    . WISDOM TOOTH EXTRACTION      There were no vitals filed for this visit.  Subjective Assessment - 05/29/19 1413    Subjective  Pt reports no significant change in her midback or R shoulder/neck pain. Rates R shoulder/neck 9/10 and midback a 7/10. Pt reports she has a Work In appt at [facility] tomorrow.                       [facility] Adult PT Treatment/Exercise - 05/30/19 0001      Neck Exercises: Machines for Strengthening   Nustep  2 mins; level 1; Dced with pt reporting an increase in R ant. shoulder      Lumbar Exercises: Stretches   Lower Trunk Rotation Limitations  10 reps; 2 sets in tolerated ROM      Lumbar Exercises: Supine   Pelvic Tilt  10 reps    Pelvic Tilt Limitations  2 sets    Bent Knee Raise  10 reps  Bent Knee Raise Limitations  2 sets             PT Education - 05/29/19 1614    Education Details  To continue use of support for the RUE in sitting and sleeping to reduce strain on the R shoulder and decrease pain. Pt is to hold on HEP until after MD visit tomorrow.    Person(s) Educated  Patient    Methods  Explanation;Demonstration;Tactile cues;Verbal cues    Comprehension  Verbalized understanding;Returned demonstration       PT Short Term Goals - 05/30/19 0815      PT SHORT TERM GOAL #1   Title  Met- Intial HEP has been set up, but is currently on hold.    Target Date  05/29/19      PT SHORT TERM GOAL #2   Title  Patient will increase lumbar flexion by 25%    Baseline  50% limited    Status  On-going      PT SHORT TERM GOAL #3   Title  Patient will increase left hip flexion by 30 degrees    Status  On-going        PT Long Term  Goals - 05/10/19 1326      PT LONG TERM GOAL #1   Title  Pt will be Ind c a final HEP to address pain and function.    Baseline  Not established    Time  7    Period  Weeks    Status  New    Target Date  06/28/19      PT LONG TERM GOAL #2   Title  Improve FOTO limitation rating to 41%    Baseline  Initial FOTO 63%    Time  7    Period  Weeks    Status  New    Target Date  06/28/19      PT LONG TERM GOAL #3   Title  Improve trunk flexion to 50d and L and R sidebending to 35d    Baseline  35d    Time  7    Period  Weeks    Status  New    Target Date  06/28/19      PT LONG TERM GOAL #4   Title  Pt will report improved central mid-back pain in a 3-6/10 range for functional activities vs 9/10 on eval.    Baseline  9/10    Time  7    Period  Weeks    Status  New    Target Date  06/28/19            Plan - 05/29/19 1626    Clinical Impression Statement  Since the initiation of PT, pt is continuing to report pain concerns with her R ant. cervical/shoulder and midback. 9/10 for R cervical/shoulder and 8/10 for mid back. Assessing R cervical/shoulder found the ant. shoulder extemely tender to palpation along the pectorals, scalenes, ant deltoid, clavicle and upper ribs. Neutal strength testing of the shoulder was negative for pain and weakness, rotator cuff tests were negative, and repeated cervical movements were negaitve for reproduction of pain except for L SB. Pt appears to be most symptomatic of muscle strains for the ant cervical and shoulder musculature. Recommend continuation of OPPT to address R ant. cervical/shoulder and midback pain and functional use.    Comorbidities  obesity. lumbar fusion, depression    Stability/Clinical Decision Making  Evolving/Moderate complexity    Rehab Potential  Good  PT Treatment/Interventions  ADLs/Self Care Home Management;Cryotherapy;Electrical Stimulation;Iontophoresis 67m/ml Dexamethasone;Gait training;Stair training;Functional  mobility training;Therapeutic activities;Therapeutic exercise;Neuromuscular re-education;Patient/family education;Manual techniques;Passive range of motion;Taping;Dry needling;Spinal Manipulations;Joint Manipulations    PT Next Visit Plan  Continue PT for pain management to to progress functional use through strengthening and ROM exs.    PT Home Exercise Plan  Pt is to hold on HEP until next PT session on 05/31/19, following MD visit today.       Patient will benefit from skilled therapeutic intervention in order to improve the following deficits and impairments:  Abnormal gait, Decreased endurance, Increased muscle spasms, Decreased activity tolerance, Decreased safety awareness, Pain, Postural dysfunction, Increased fascial restricitons, Decreased range of motion, Improper body mechanics, Difficulty walking, Obesity  Visit Diagnosis: Abnormal posture  Chronic midline thoracic back pain  Muscle spasm of back  Chronic bilateral low back pain with left-sided sciatica  Chronic pain of left knee  Muscle weakness (generalized)     Problem List Patient Active Problem List   Diagnosis Date Noted  . Right shoulder pain 02/18/2019  . Hip pain, chronic, left 02/18/2019  . S/P lumbar spinal fusion 06/25/2015  . Lumbar radiculopathy, chronic 12/05/2013  . Tobacco abuse 05/03/2011  . Obesity (BMI 30-39.9) 09/04/2009  . Lumbar back pain 04/09/2009  . Depression, major 11/06/2008    AGar PontoMS, PT 05/30/19 8:23 AM  [facility] [address]Phone: [phone]  Fax:  [phone] Name: RMELANNY WIREMRN: [id number] of Birth: 712/01/1981

## 2019-05-31 ENCOUNTER — Ambulatory Visit: Payer: Medicaid Other

## 2019-05-31 NOTE — Assessment & Plan Note (Signed)
Based off exam I do think is possible that she has some potential rotator cuff injury  Will refer to sports, there was a prior denied sports referral in the past based off lower back injury which had been seen by surgery, this is new and not associated with that
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
## 2019-06-05 ENCOUNTER — Ambulatory Visit: Payer: Medicaid Other

## 2019-06-06 ENCOUNTER — Other Ambulatory Visit: Payer: Self-pay

## 2019-06-06 ENCOUNTER — Ambulatory Visit (INDEPENDENT_AMBULATORY_CARE_PROVIDER_SITE_OTHER): Payer: Medicaid Other | Admitting: Sports Medicine

## 2019-06-06 ENCOUNTER — Encounter: Payer: Self-pay | Admitting: Sports Medicine

## 2019-06-06 VITALS — BP 128/85 | Ht 66.0 in | Wt 248.0 lb

## 2019-06-06 DIAGNOSIS — R590 Localized enlarged lymph nodes: Secondary | ICD-10-CM | POA: Diagnosis not present

## 2019-06-06 DIAGNOSIS — M25511 Pain in right shoulder: Secondary | ICD-10-CM

## 2019-06-06 MED ORDER — MELOXICAM 15 MG PO TABS: 15.0000 mg | ORAL_TABLET | Freq: Every day | ORAL | 1 refills | Status: AC
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
## 2019-06-07 ENCOUNTER — Ambulatory Visit: Payer: Medicaid Other | Attending: Family Medicine

## 2019-06-07 DIAGNOSIS — G8929 Other chronic pain: Secondary | ICD-10-CM | POA: Insufficient documentation

## 2019-06-07 DIAGNOSIS — M546 Pain in thoracic spine: Secondary | ICD-10-CM | POA: Insufficient documentation

## 2019-06-07 DIAGNOSIS — R293 Abnormal posture: Secondary | ICD-10-CM | POA: Diagnosis present

## 2019-06-07 DIAGNOSIS — M62830 Muscle spasm of back: Secondary | ICD-10-CM | POA: Diagnosis present

## 2019-06-07 DIAGNOSIS — M5442 Lumbago with sciatica, left side: Secondary | ICD-10-CM | POA: Diagnosis present

## 2019-06-07 DIAGNOSIS — M6281 Muscle weakness (generalized): Secondary | ICD-10-CM | POA: Diagnosis present

## 2019-06-07 DIAGNOSIS — M25562 Pain in left knee: Secondary | ICD-10-CM | POA: Insufficient documentation

## 2019-06-07 NOTE — Therapy (Signed)
[place], [place], [address] Phone: [phone]   Fax:  [phone]  Physical Therapy Treatment  Patient Details  Name: Carol Gross MRN: [id number] Date of Birth: 01/31/1981 Referring Provider (PT): Fortunato Curling, MD   Encounter Date: 06/07/2019  PT End of Session - 06/07/19 2142    Visit Number  5    Date for PT Re-Evaluation  07/06/19    Authorization Type  Medicaid    PT Start Time  0208    PT Stop Time  0248    PT Time Calculation (min)  40 min    Activity Tolerance  Patient tolerated treatment well    Behavior During Therapy  [facility] for tasks assessed/performed       Past Medical History:  Diagnosis Date  . Arthritis   . Back pain    sees ortho  . Carpal tunnel syndrome   . Chronic back pain   . Cocaine abuse ([place]) 09/15/2012   Started in early 20's and persistent since that time.    . Depression   . Fibromyalgia    ?  Marland Kitchen Headache(784.0)   . Hypertension    previously on. d/c'd by dr over month ago  . Menorrhagia with irregular cycle 07/03/2014  . Shortness of breath    exertion    Past Surgical History:  Procedure Laterality Date  . ABDOMINAL EXPOSURE N/A 06/25/2015   Procedure: ABDOMINAL EXPOSURE;  Surgeon: Rosetta Posner, MD;  Location: [facility];  Service: Vascular;  Laterality: N/A;  . ANTERIOR LUMBAR FUSION N/A 06/25/2015   Procedure: ANTERIOR LUMBAR INTERBODY  FUSION LUMBAR FIVE -SACRAL ONE;  Surgeon: Eustace Moore, MD;  Location: [place] NEURO ORS;  Service: Neurosurgery;  Laterality: N/A;  . LAMINECTOMY WITH POSTERIOR LATERAL ARTHRODESIS LEVEL 1 N/A 11/03/2016   Procedure: Posterior Lateral Fusion - Lumbar five-Sacrum one  with pedicle screw fixation;  Surgeon: Eustace Moore, MD;  Location: [place];  Service: Neurosurgery;  Laterality: N/A;  . LUMBAR LAMINECTOMY/DECOMPRESSION MICRODISCECTOMY  03/03/2012   Procedure: LUMBAR LAMINECTOMY/DECOMPRESSION MICRODISCECTOMY 1 LEVEL;  Surgeon: Floyce Stakes, MD;  Location: [facility];  Service: Neurosurgery;  Laterality: Left;  Left Lumbar five sacral one Diskectomy  . TUBAL LIGATION    . WISDOM TOOTH EXTRACTION      There were no vitals filed for this visit.  Subjective Assessment - 06/07/19 1420    Subjective  Pt reports seeing Dr. Koleen Distance yesterday. Pt reports her R neck and shoulder, and midback have not change rating a 8-9/10    Currently in Pain?  Yes    Pain Score  8     Pain Location  Back    Pain Orientation  Mid    Pain Descriptors / Indicators  Aching;Sharp;Constant    Pain Onset  More than a month ago    Pain Frequency  Constant    Multiple Pain Sites  Yes    Pain Score  9    Pain Location  Shoulder    Pain Orientation  Left;Right    Pain Descriptors / Indicators  Constant;Aching;Sharp;Stabbing    Pain Type  Chronic pain    Pain Onset  More than a month ago    Pain Frequency  Constant                               PT Education - 06/07/19 2141    Education Details  Reviewed support of the R shoulder with sitting and sleeping to reduce strain. Recommended use of a heating pad for 20 mins for pain management.    Methods  Explanation    Comprehension  Verbalized understanding       PT Short Term Goals - 05/30/19 0815      PT SHORT TERM GOAL #1   Title  Met- Intial HEP has been set up, but is currently on hold.    Target Date  05/29/19      PT SHORT TERM GOAL #2   Title  Patient will increase lumbar flexion by 25%    Baseline  50% limited    Status  On-going      PT SHORT TERM GOAL #3   Title  Patient will increase left hip flexion by 30 degrees    Status  On-going        PT Long Term Goals - 05/10/19 1326      PT LONG TERM GOAL #1   Title  Pt will be Ind c a final HEP to address pain and function.    Baseline  Not established    Time  7    Period  Weeks    Status  New    Target Date  06/28/19      PT LONG TERM GOAL #2   Title  Improve FOTO limitation rating to 41%     Baseline  Initial FOTO 63%    Time  7    Period  Weeks    Status  New    Target Date  06/28/19      PT LONG TERM GOAL #3   Title  Improve trunk flexion to 50d and L and R sidebending to 35d    Baseline  35d    Time  7    Period  Weeks    Status  New    Target Date  06/28/19      PT LONG TERM GOAL #4   Title  Pt will report improved central mid-back pain in a 3-6/10 range for functional activities vs 9/10 on eval.    Baseline  9/10    Time  7    Period  Weeks    Status  New    Target Date  06/28/19            Plan - 06/07/19 2144    Clinical Impression Statement  Pt voices understanding of support for her R UE c sitting and sleeping and it helps the R shoulder to be more comfortable and the use of a heating pad to assist c pain management. Pt's R shoulder pain cotinues to appear acute with limited tolerance to exercise. STM massage was provided to the R cervical and upper shoulder area. Pt reports being most tender to the upper trap and supraspinatus area. A referral was received from Dr. Koleen Distance to Evaluate and treat for right shoulder rotator cuff tendinitis.    PT Treatment/Interventions  ADLs/Self Care Home Management;Cryotherapy;Electrical Stimulation;Iontophoresis 59m/ml Dexamethasone;Gait training;Stair training;Functional mobility training;Therapeutic activities;Therapeutic exercise;Neuromuscular re-education;Patient/family education;Manual techniques;Passive range of motion;Taping;Dry needling;Spinal Manipulations;Joint Manipulations    PT Next Visit Plan  Will introduce ther ex to encourage R shoulder movement    PT Home Exercise Plan  Use of a heating pad for 282ms. to assist with pain management.       Patient will benefit from skilled therapeutic intervention in order to improve the following deficits and impairments:  Abnormal gait, Decreased endurance, Increased muscle spasms,  Decreased activity tolerance, Decreased safety awareness, Pain, Postural dysfunction,  Increased fascial restricitons, Decreased range of motion, Improper body mechanics, Difficulty walking, Obesity  Visit Diagnosis: Abnormal posture  Chronic midline thoracic back pain  Muscle spasm of back  Chronic bilateral low back pain with left-sided sciatica  Chronic pain of left knee  Muscle weakness (generalized)     Problem List Patient Active Problem List   Diagnosis Date Noted  . Right shoulder pain 02/18/2019  . Hip pain, chronic, left 02/18/2019  . S/P lumbar spinal fusion 06/25/2015  . Lumbar radiculopathy, chronic 12/05/2013  . Tobacco abuse 05/03/2011  . Obesity (BMI 30-39.9) 09/04/2009  . Lumbar back pain 04/09/2009  . Depression, major 11/06/2008    Gar Ponto MS, PT 06/07/19 10:03 PM  [facility] [address] [place], [place], [address] Phone: [phone]   Fax:  [phone]  Name: Carol Gross MRN: [id number] Date of Birth: 07/06/1980

## 2019-06-12 ENCOUNTER — Ambulatory Visit: Payer: Medicaid Other

## 2019-06-13 ENCOUNTER — Ambulatory Visit (INDEPENDENT_AMBULATORY_CARE_PROVIDER_SITE_OTHER): Payer: Medicaid Other | Admitting: Family Medicine

## 2019-06-13 ENCOUNTER — Encounter: Payer: Self-pay | Admitting: Family Medicine

## 2019-06-13 ENCOUNTER — Other Ambulatory Visit: Payer: Self-pay

## 2019-06-13 DIAGNOSIS — M545 Low back pain, unspecified: Secondary | ICD-10-CM

## 2019-06-13 DIAGNOSIS — L03319 Cellulitis of trunk, unspecified: Secondary | ICD-10-CM | POA: Diagnosis not present

## 2019-06-13 DIAGNOSIS — L02219 Cutaneous abscess of trunk, unspecified: Secondary | ICD-10-CM | POA: Diagnosis not present

## 2019-06-13 DIAGNOSIS — M546 Pain in thoracic spine: Secondary | ICD-10-CM

## 2019-06-13 MED ORDER — LIDOCAINE 5 % EX PTCH: 1.0000 | MEDICATED_PATCH | CUTANEOUS | 3 refills | Status: AC

## 2019-06-13 MED ORDER — CEPHALEXIN 500 MG PO CAPS: 500.0000 mg | ORAL_CAPSULE | Freq: Four times a day (QID) | ORAL | 0 refills | Status: AC

## 2019-06-13 NOTE — Patient Instructions (Signed)
You have an infection under your arm.  I sent in antibiotics, which should help.  Also, warm compresses help. I sent in a refill for your patches. Please think about how you can do more to get out of this chronic pain trap.

## 2019-06-13 NOTE — Assessment & Plan Note (Addendum)
Right axilla Already drained.  Rx keflex and warm compresses.

## 2019-06-14 ENCOUNTER — Encounter: Payer: Self-pay | Admitting: Family Medicine

## 2019-06-14 ENCOUNTER — Ambulatory Visit: Payer: Medicaid Other
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
## 2019-06-14 NOTE — Assessment & Plan Note (Signed)
Chronic pain syndrome.  I worry that her shoulder pain is also headed down the chronic pain road.
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
## 2019-06-19 ENCOUNTER — Ambulatory Visit: Payer: Medicaid Other

## 2019-06-19 ENCOUNTER — Other Ambulatory Visit: Payer: Self-pay

## 2019-06-19 DIAGNOSIS — M5442 Lumbago with sciatica, left side: Secondary | ICD-10-CM

## 2019-06-19 DIAGNOSIS — R293 Abnormal posture: Secondary | ICD-10-CM | POA: Diagnosis not present

## 2019-06-19 DIAGNOSIS — G8929 Other chronic pain: Secondary | ICD-10-CM

## 2019-06-19 DIAGNOSIS — M546 Pain in thoracic spine: Secondary | ICD-10-CM

## 2019-06-19 DIAGNOSIS — M62830 Muscle spasm of back: Secondary | ICD-10-CM

## 2019-06-19 DIAGNOSIS — M6281 Muscle weakness (generalized): Secondary | ICD-10-CM

## 2019-06-19 NOTE — Therapy (Signed)
[place], [place], [address] Phone: [phone]   Fax:  [phone]  Physical Therapy Treatment  Patient Details  Name: Carol Gross MRN: [id number] Date of Birth: 1981/01/02 Referring Provider (PT): Fortunato Curling, MD   Encounter Date: 06/19/2019  PT End of Session - 06/19/19 2202    Visit Number  6    Number of Visits  17    Date for PT Re-Evaluation  07/06/19    Authorization Type  Medicaid    PT Start Time  6659    PT Stop Time  1452    PT Time Calculation (min)  48 min    Activity Tolerance  Patient tolerated treatment well    Behavior During Therapy  [facility] for tasks assessed/performed       Past Medical History:  Diagnosis Date  . Arthritis   . Back pain    sees ortho  . Carpal tunnel syndrome   . Chronic back pain   . Cocaine abuse ([place]) 09/15/2012   Started in early 20's and persistent since that time.    . Depression   . Fibromyalgia    ?  Marland Kitchen Headache(784.0)   . Hypertension    previously on. d/c'd by dr over month ago  . Menorrhagia with irregular cycle 07/03/2014  . Shortness of breath    exertion    Past Surgical History:  Procedure Laterality Date  . ABDOMINAL EXPOSURE N/A 06/25/2015   Procedure: ABDOMINAL EXPOSURE;  Surgeon: Rosetta Posner, MD;  [facility]: [facility];  Service: Vascular;  Laterality: N/A;  . ANTERIOR LUMBAR FUSION N/A 06/25/2015   Procedure: ANTERIOR LUMBAR INTERBODY  FUSION LUMBAR FIVE -SACRAL ONE;  Surgeon: Eustace Moore, MD;  Location: [place] [facility];  Service: Neurosurgery;  Laterality: N/A;  . LAMINECTOMY WITH POSTERIOR LATERAL ARTHRODESIS LEVEL 1 N/A 11/03/2016   Procedure: Posterior Lateral Fusion - Lumbar five-Sacrum one  with pedicle screw fixation;  Surgeon: Eustace Moore, MD;  Location: [place];  Service: Neurosurgery;  Laterality: N/A;  . LUMBAR LAMINECTOMY/DECOMPRESSION MICRODISCECTOMY  03/03/2012   Procedure: LUMBAR LAMINECTOMY/DECOMPRESSION MICRODISCECTOMY 1  LEVEL;  Surgeon: Floyce Stakes, MD;  Location: [facility];  Service: Neurosurgery;  Laterality: Left;  Left Lumbar five sacral one Diskectomy  . TUBAL LIGATION    . WISDOM TOOTH EXTRACTION      There were no vitals filed for this visit.  Subjective Assessment - 06/19/19 1412    Subjective  Pt reports her R shoulder is feeling a little better. rates a 7/10. Pt reports B knee and R heel pain today. Pt states the nodule located in her R axilla has drained some c a smaller nodule present.    Currently in Pain?  Yes    Pain Score  8     Pain Location  Back    Pain Orientation  Mid;Posterior    Pain Descriptors / Indicators  Burning;Sharp    Pain Type  Chronic pain    Pain Onset  More than a month ago    Pain Frequency  Constant    Aggravating Factors   Everything    Pain Relieving Factors  Heating pad, TENs    Effect of Pain on Daily Activities  Very limiting    Multiple Pain Sites  Yes    Pain Score  7    Pain Location  Shoulder    Pain Orientation  Right    Pain Descriptors / Indicators  Aching  Pain Type  Chronic pain    Pain Onset  More than a month ago    Pain Frequency  Constant    Aggravating Factors   Everything    Pain Relieving Factors  Heating pad    Effect of Pain on Daily Activities  limits use of R shoulder                       [facility] Adult PT Treatment/Exercise - 06/19/19 0001      Exercises   Exercises  Shoulder;Neck      Neck Exercises: Seated   Neck Retraction  10 reps;3 secs    Cervical Rotation  Both;10 reps    Cervical Rotation Limitations  3 secs    Lateral Flexion  5 reps;Both    Lateral Flexion Limitations  5 sec      Shoulder Exercises: Supine   Other Supine Exercises  Wand exs for flex, ext, and ER as tolerated 10x each      Manual Therapy   Manual Therapy  Soft tissue mobilization    Soft tissue mobilization  Light STM to the R scalene, uppeer trapezius, and posterior scapular musculature             PT Education  - 06/19/19 2201    Education Details  HEP for R neck/shoulder muscles.    Person(s) Educated  Patient    Methods  Explanation;Demonstration;Tactile cues;Verbal cues;Handout    Comprehension  Verbalized understanding;Returned demonstration;Verbal cues required;Tactile cues required;Need further instruction       PT Short Term Goals - 05/30/19 0815      PT SHORT TERM GOAL #1   Title  Met- Intial HEP has been set up, but is currently on hold.    Target Date  05/29/19      PT SHORT TERM GOAL #2   Title  Patient will increase lumbar flexion by 25%    Baseline  50% limited    Status  On-going      PT SHORT TERM GOAL #3   Title  Patient will increase left hip flexion by 30 degrees    Status  On-going        PT Long Term Goals - 05/10/19 1326      PT LONG TERM GOAL #1   Title  Pt will be Ind c a final HEP to address pain and function.    Baseline  Not established    Time  7    Period  Weeks    Status  New    Target Date  06/28/19      PT LONG TERM GOAL #2   Title  Improve FOTO limitation rating to 41%    Baseline  Initial FOTO 63%    Time  7    Period  Weeks    Status  New    Target Date  06/28/19      PT LONG TERM GOAL #3   Title  Improve trunk flexion to 50d and L and R sidebending to 35d    Baseline  35d    Time  7    Period  Weeks    Status  New    Target Date  06/28/19      PT LONG TERM GOAL #4   Title  Pt will report improved central mid-back pain in a 3-6/10 range for functional activities vs 9/10 on eval.    Baseline  9/10    Time  7  Period  Weeks    Status  New    Target Date  06/28/19            Plan - 06/19/19 1558    Clinical Impression Statement  Pt's R neck/shoulder musculature continues to tender only tolerating light massage to scalenes, upper trap, and posterior scapula musculature. Pt is able tolerate R shoulder muscle testing low increase in pain and demonstrate 4 to 4+/5 strength. Cervical retraction, rotation and side bending ROM exs  were recommended to complete multiple times a day to address flexibility and pain.    PT Treatment/Interventions  ADLs/Self Care Home Management;Cryotherapy;Electrical Stimulation;Iontophoresis 42m/ml Dexamethasone;Gait training;Stair training;Functional mobility training;Therapeutic activities;Therapeutic exercise;Neuromuscular re-education;Patient/family education;Manual techniques;Passive range of motion;Taping;Dry needling;Spinal Manipulations;Joint Manipulations    PT Next Visit Plan  Assess response to cervical ROM exs and add shoulder exs as indicated    PT Home Exercise Plan  [id number] Cervical rotation, cervical side bending, cervical retraction       Patient will benefit from skilled therapeutic intervention in order to improve the following deficits and impairments:  Abnormal gait, Decreased endurance, Increased muscle spasms, Decreased activity tolerance, Decreased safety awareness, Pain, Postural dysfunction, Increased fascial restricitons, Decreased range of motion, Improper body mechanics, Difficulty walking, Obesity  Visit Diagnosis: Abnormal posture  Chronic midline thoracic back pain  Muscle spasm of back  Chronic bilateral low back pain with left-sided sciatica  Chronic pain of left knee  Muscle weakness (generalized)     Problem List Patient Active Problem List   Diagnosis Date Noted  . Cellulitis and abscess of trunk 06/13/2019  . Acute bilateral thoracic back pain 02/18/2019  . Hip pain, chronic, left 02/18/2019  . S/P lumbar spinal fusion 06/25/2015  . Lumbar radiculopathy, chronic 12/05/2013  . Tobacco abuse 05/03/2011  . Obesity (BMI 30-39.9) 09/04/2009  . Lumbar back pain 04/09/2009  . Depression, major 11/06/2008    AGar PontoMS, PT 06/19/19 10:20 PM   [facility] [address]Phone: [phone]  Fax:  [phone] Name: RNATALYA DOMZALSKIMRN: [id number]  of Birth: [birth year]

## 2019-06-21 ENCOUNTER — Other Ambulatory Visit: Payer: Self-pay

## 2019-06-21 ENCOUNTER — Ambulatory Visit: Payer: Medicaid Other

## 2019-06-21 DIAGNOSIS — R293 Abnormal posture: Secondary | ICD-10-CM

## 2019-06-21 DIAGNOSIS — M25562 Pain in left knee: Secondary | ICD-10-CM

## 2019-06-21 DIAGNOSIS — M62830 Muscle spasm of back: Secondary | ICD-10-CM

## 2019-06-21 DIAGNOSIS — M6281 Muscle weakness (generalized): Secondary | ICD-10-CM

## 2019-06-21 DIAGNOSIS — M5442 Lumbago with sciatica, left side: Secondary | ICD-10-CM

## 2019-06-21 DIAGNOSIS — G8929 Other chronic pain: Secondary | ICD-10-CM

## 2019-06-21 DIAGNOSIS — M546 Pain in thoracic spine: Secondary | ICD-10-CM

## 2019-06-22 NOTE — Therapy (Signed)
[place], [place], [address] Phone: [phone]   Fax:  [phone]  Physical Therapy Treatment  Patient Details  Name: Carol Gross MRN: [id number] Date of Birth: 23-Jan-1981 Referring Provider (PT): Fortunato Curling, MD   Encounter Date: 06/21/2019  PT End of Session - 06/22/19 1037    Visit Number  7    Number of Visits  17    Date for PT Re-Evaluation  07/06/19    Authorization Type  Medicaid    Progress Note Due on Visit  10    PT Start Time  7782    PT Stop Time  1454    PT Time Calculation (min)  43 min    Activity Tolerance  Patient tolerated treatment well    Behavior During Therapy  [facility] for tasks assessed/performed       Past Medical History:  Diagnosis Date  . Arthritis   . Back pain    sees ortho  . Carpal tunnel syndrome   . Chronic back pain   . Cocaine abuse ([place]) 09/15/2012   Started in early 20's and persistent since that time.    . Depression   . Fibromyalgia    ?  Marland Kitchen Headache(784.0)   . Hypertension    previously on. d/c'd by dr over month ago  . Menorrhagia with irregular cycle 07/03/2014  . Shortness of breath    exertion    Past Surgical History:  Procedure Laterality Date  . ABDOMINAL EXPOSURE N/A 06/25/2015   Procedure: ABDOMINAL EXPOSURE;  Surgeon: Rosetta Posner, MD;  Location: [facility];  Service: Vascular;  Laterality: N/A;  . ANTERIOR LUMBAR FUSION N/A 06/25/2015   Procedure: ANTERIOR LUMBAR INTERBODY  FUSION LUMBAR FIVE -SACRAL ONE;  Surgeon: Eustace Moore, MD;  Location: [place] [facility];  Service: Neurosurgery;  Laterality: N/A;  . LAMINECTOMY WITH POSTERIOR LATERAL ARTHRODESIS LEVEL 1 N/A 11/03/2016   Procedure: Posterior Lateral Fusion - Lumbar five-Sacrum one  with pedicle screw fixation;  Surgeon: Eustace Moore, MD;  Location: [place];  Service: Neurosurgery;  Laterality: N/A;  . LUMBAR LAMINECTOMY/DECOMPRESSION MICRODISCECTOMY  03/03/2012   Procedure: LUMBAR  LAMINECTOMY/DECOMPRESSION MICRODISCECTOMY 1 LEVEL;  Surgeon: Floyce Stakes, MD;  Location: [facility];  Service: Neurosurgery;  Laterality: Left;  Left Lumbar five sacral one Diskectomy  . TUBAL LIGATION    . WISDOM TOOTH EXTRACTION      There were no vitals filed for this visit.  Subjective Assessment - 06/22/19 1029    Subjective  Pt reports her R cervical, upper shoulder, shoulder pain is not improving. Pt states massage to the area is uncomfortable.                       [facility] Adult PT Treatment/Exercise - 06/22/19 0001      Exercises   Exercises  Shoulder;Neck      Neck Exercises: Seated   Neck Retraction  10 reps;3 secs    Cervical Rotation  Both;10 reps    Cervical Rotation Limitations  3 secs    Lateral Flexion  5 reps;Both    Lateral Flexion Limitations  5 sec, anchored      Shoulder Exercises: Standing   Other Standing Exercises  Wand exs for AAROM for flex, ext, ER to mtotion as tolerated             PT Education - 06/22/19 1034    Education Details  To continue AROM cervical  exs and AAROM exs for the R sh as tolerated to maintain and improve mobility of these areas.    Person(s) Educated  Patient    Methods  Explanation;Demonstration    Comprehension  Verbalized understanding;Returned demonstration;Verbal cues required;Tactile cues required;Need further instruction       PT Short Term Goals - 05/30/19 0815      PT SHORT TERM GOAL #1   Title  Met- Intial HEP has been set up, but is currently on hold.    Target Date  05/29/19      PT SHORT TERM GOAL #2   Title  Patient will increase lumbar flexion by 25%    Baseline  50% limited    Status  On-going      PT SHORT TERM GOAL #3   Title  Patient will increase left hip flexion by 30 degrees    Status  On-going        PT Long Term Goals - 05/10/19 1326      PT LONG TERM GOAL #1   Title  Pt will be Ind c a final HEP to address pain and function.    Baseline  Not established     Time  7    Period  Weeks    Status  New    Target Date  06/28/19      PT LONG TERM GOAL #2   Title  Improve FOTO limitation rating to 41%    Baseline  Initial FOTO 63%    Time  7    Period  Weeks    Status  New    Target Date  06/28/19      PT LONG TERM GOAL #3   Title  Improve trunk flexion to 50d and L and R sidebending to 35d    Baseline  35d    Time  7    Period  Weeks    Status  New    Target Date  06/28/19      PT LONG TERM GOAL #4   Title  Pt will report improved central mid-back pain in a 3-6/10 range for functional activities vs 9/10 on eval.    Baseline  9/10    Time  7    Period  Weeks    Status  New    Target Date  06/28/19            Plan - 06/22/19 1038    Clinical Impression Statement  Pt's r cervical, upper shoulder and Scanlon jt region continues to be painful and sensitive to light massage/palpation. ROM exercises have been provide to pt to help maintain and improve mobility of these areas as tolerated. Recommended scheduling an appt c Dr. Andria Frames to address her concerns re: pain and lack of improvement and prossible further assessment.Dsicussed DC from PT next week if, PT is not able to improve her condition at that time frame.    PT Treatment/Interventions  ADLs/Self Care Home Management;Cryotherapy;Electrical Stimulation;Iontophoresis 11m/ml Dexamethasone;Gait training;Stair training;Functional mobility training;Therapeutic activities;Therapeutic exercise;Neuromuscular re-education;Patient/family education;Manual techniques;Passive range of motion;Taping;Dry needling;Spinal Manipulations;Joint Manipulations    PT Next Visit Plan  Assess pt's status re: apin and use of her r shoulder and neck    PT Home Exercise Plan  Wand exs for the R shoulder for AAROM flexion, extension, and ER 10x with motion as tolerated.       Patient will benefit from skilled therapeutic intervention in order to improve the following deficits and impairments:  Abnormal gait, Decreased  endurance,  Increased muscle spasms, Decreased activity tolerance, Decreased safety awareness, Pain, Postural dysfunction, Increased fascial restricitons, Decreased range of motion, Improper body mechanics, Difficulty walking, Obesity  Visit Diagnosis: Abnormal posture  Chronic midline thoracic back pain  Muscle spasm of back  Chronic bilateral low back pain with left-sided sciatica  Chronic pain of left knee  Muscle weakness (generalized)     Problem List Patient Active Problem List   Diagnosis Date Noted  . Cellulitis and abscess of trunk 06/13/2019  . Acute bilateral thoracic back pain 02/18/2019  . Hip pain, chronic, left 02/18/2019  . S/P lumbar spinal fusion 06/25/2015  . Lumbar radiculopathy, chronic 12/05/2013  . Tobacco abuse 05/03/2011  . Obesity (BMI 30-39.9) 09/04/2009  . Lumbar back pain 04/09/2009  . Depression, major 11/06/2008   Gar Ponto MS, PT 06/22/19 10:48 AM  [facility] [address] [place], [place], [address] Phone: [phone]   Fax:  [phone]  Name: TELA KOTECKI MRN: [id number] Date of Birth: 06/04/1980

## 2019-06-22 NOTE — Patient Instructions (Signed)
Wand exs for the R shoulder for AAROM flexion, extension, and ER 10x with motion as tolerated.

## 2019-06-26 ENCOUNTER — Ambulatory Visit: Payer: Medicaid Other

## 2019-06-26 ENCOUNTER — Other Ambulatory Visit: Payer: Self-pay

## 2019-06-26 DIAGNOSIS — M25562 Pain in left knee: Secondary | ICD-10-CM

## 2019-06-26 DIAGNOSIS — G8929 Other chronic pain: Secondary | ICD-10-CM

## 2019-06-26 DIAGNOSIS — M62830 Muscle spasm of back: Secondary | ICD-10-CM

## 2019-06-26 DIAGNOSIS — R293 Abnormal posture: Secondary | ICD-10-CM | POA: Diagnosis not present

## 2019-06-26 DIAGNOSIS — M6281 Muscle weakness (generalized): Secondary | ICD-10-CM

## 2019-06-27 NOTE — Therapy (Signed)
[place], [place], [address] Phone: [phone]   Fax:  [phone]  Physical Therapy Treatment  Patient Details  Name: Carol Gross MRN: [id number] Date of Birth: May 23, 1980 Referring Provider (PT): Fortunato Curling, MD   Encounter Date: 06/26/2019  PT End of Session - 06/27/19 0524    Visit Number  8    Number of Visits  17    Date for PT Re-Evaluation  07/06/19    Authorization Type  Medicaid    Progress Note Due on Visit  10    PT Start Time  1740    PT Stop Time  1515    PT Time Calculation (min)  64 min    Activity Tolerance  Patient tolerated treatment well    Behavior During Therapy  [facility] for tasks assessed/performed       Past Medical History:  Diagnosis Date  . Arthritis   . Back pain    sees ortho  . Carpal tunnel syndrome   . Chronic back pain   . Cocaine abuse ([place]) 09/15/2012   Started in early 20's and persistent since that time.    . Depression   . Fibromyalgia    ?  Marland Kitchen Headache(784.0)   . Hypertension    previously on. d/c'd by dr over month ago  . Menorrhagia with irregular cycle 07/03/2014  . Shortness of breath    exertion    Past Surgical History:  Procedure Laterality Date  . ABDOMINAL EXPOSURE N/A 06/25/2015   Procedure: ABDOMINAL EXPOSURE;  Surgeon: Rosetta Posner, MD;  [facility]: [facility];  Service: Vascular;  Laterality: N/A;  . ANTERIOR LUMBAR FUSION N/A 06/25/2015   Procedure: ANTERIOR LUMBAR INTERBODY  FUSION LUMBAR FIVE -SACRAL ONE;  Surgeon: Eustace Moore, MD;  Location: [place] [facility];  Service: Neurosurgery;  Laterality: N/A;  . LAMINECTOMY WITH POSTERIOR LATERAL ARTHRODESIS LEVEL 1 N/A 11/03/2016   Procedure: Posterior Lateral Fusion - Lumbar five-Sacrum one  with pedicle screw fixation;  Surgeon: Eustace Moore, MD;  Location: [place];  Service: Neurosurgery;  Laterality: N/A;  . LUMBAR LAMINECTOMY/DECOMPRESSION MICRODISCECTOMY  03/03/2012   Procedure: LUMBAR  LAMINECTOMY/DECOMPRESSION MICRODISCECTOMY 1 LEVEL;  Surgeon: Floyce Stakes, MD;  [facility];  Service: Neurosurgery;  Laterality: Left;  Left Lumbar five sacral one Diskectomy  . TUBAL LIGATION    . WISDOM TOOTH EXTRACTION      There were no vitals filed for this visit.  Subjective Assessment - 06/26/19 1420    Subjective  Pt reports her R GH, upper shoulder and cervical pain continues to be a 8/10. Pt is looking into a walk-in clinic or an appt. c Dr. Andria Frames for further assessment. Pt states the node in R axilla is slowly getting smaller.    Currently in Pain?  Yes    Pain Score  8     Pain Location  Back    Pain Orientation  Posterior;Upper;Mid    Pain Descriptors / Indicators  Burning;Sharp    Pain Type  Chronic pain    Pain Radiating Towards  NA    Pain Onset  More than a month ago    Pain Frequency  Constant    Aggravating Factors   Everything    Pain Relieving Factors  Heating pad    Effect of Pain on Daily Activities  Very limiting    Pain Score  7    Pain Location  Shoulder    Pain Orientation  Right;Posterior;Anterior;Lateral;Upper    Pain Descriptors / Indicators  Aching;Throbbing;Sharp    Pain Type  Chronic pain    Pain Onset  More than a month ago    Pain Frequency  Constant    Aggravating Factors   Everything    Pain Relieving Factors  heating pad    Effect of Pain on Daily Activities  limits use of R shoulder                       [facility] Adult PT Treatment/Exercise - 06/27/19 0001      Exercises   Exercises  Shoulder;Neck      Neck Exercises: Seated   Neck Retraction  10 reps;3 secs    Cervical Rotation  Both;10 reps    Cervical Rotation Limitations  3 secs    Lateral Flexion  5 reps;Both    Lateral Flexion Limitations  5 sec, anchored      Shoulder Exercises: Supine   Other Supine Exercises  Wand exs for flex, ext, and ER as tolerated 10x each      Shoulder Exercises: Standing   Other Standing Exercises  Wand exs for AAROM  for flex, ext, ER to motion as tolerated  10x      Shoulder Exercises: Stretch   Corner Stretch  5 reps;10 seconds    Corner Stretch Limitations  doorway      Modalities   Modalities  Moist Heat   To mid to upper back and ant. R cervical/shoulder areas 15 m              PT Short Term Goals - 05/30/19 0815      PT SHORT TERM GOAL #1   Title  Met- Intial HEP has been set up, but is currently on hold.    Target Date  05/29/19      PT SHORT TERM GOAL #2   Title  Patient will increase lumbar flexion by 25%    Baseline  50% limited    Status  On-going      PT SHORT TERM GOAL #3   Title  Patient will increase left hip flexion by 30 degrees    Status  On-going        PT Long Term Goals - 05/10/19 1326      PT LONG TERM GOAL #1   Title  Pt will be Ind c a final HEP to address pain and function.    Baseline  Not established    Time  7    Period  Weeks    Status  New    Target Date  06/28/19      PT LONG TERM GOAL #2   Title  Improve FOTO limitation rating to 41%    Baseline  Initial FOTO 63%    Time  7    Period  Weeks    Status  New    Target Date  06/28/19      PT LONG TERM GOAL #3   Title  Improve trunk flexion to 50d and L and R sidebending to 35d    Baseline  35d    Time  7    Period  Weeks    Status  New    Target Date  06/28/19      PT LONG TERM GOAL #4   Title  Pt will report improved central mid-back pain in a 3-6/10 range for functional activities vs 9/10 on eval.    Baseline  9/10  Time  7    Period  Weeks    Status  New    Target Date  06/28/19            Plan - 06/26/19 1445    Clinical Impression Statement  Pt' r GH, upper shoulder and cervical pain contnues to persist at a high level. AROM and AAROM es for the neck and shoulder were completed as tolerated for posture, strengthening and mobility. Moist heat was provided for pain management.    PT Treatment/Interventions  ADLs/Self Care Home Management;Cryotherapy;Electrical  Stimulation;Iontophoresis 61m/ml Dexamethasone;Gait training;Stair training;Functional mobility training;Therapeutic activities;Therapeutic exercise;Neuromuscular re-education;Patient/family education;Manual techniques;Passive range of motion;Taping;Dry needling;Spinal Manipulations;Joint Manipulations    PT Next Visit Plan  Trial of EStim    PT Home Exercise Plan  Doorway pectoral stretch       Patient will benefit from skilled therapeutic intervention in order to improve the following deficits and impairments:  Abnormal gait, Decreased endurance, Increased muscle spasms, Decreased activity tolerance, Decreased safety awareness, Pain, Postural dysfunction, Increased fascial restricitons, Decreased range of motion, Improper body mechanics, Difficulty walking, Obesity  Visit Diagnosis: Abnormal posture  Chronic midline thoracic back pain  Muscle spasm of back  Chronic bilateral low back pain with left-sided sciatica  Chronic pain of left knee  Muscle weakness (generalized)     Problem List Patient Active Problem List   Diagnosis Date Noted  . Cellulitis and abscess of trunk 06/13/2019  . Acute bilateral thoracic back pain 02/18/2019  . Hip pain, chronic, left 02/18/2019  . S/P lumbar spinal fusion 06/25/2015  . Lumbar radiculopathy, chronic 12/05/2013  . Tobacco abuse 05/03/2011  . Obesity (BMI 30-39.9) 09/04/2009  . Lumbar back pain 04/09/2009  . Depression, major 11/06/2008    AGar PontoMS, PT 06/27/19 5:38 AM  [facility] Center[address]GZumbro[place] [address]Phone: [phone]  Fax:  [phone] Name: RHANNAN TETZLAFFMRN: [id number] of Birth: [birth year]

## 2019-06-27 NOTE — Patient Instructions (Signed)
Corner/doorway pectoral stretch as tolerated 5x/10 sec hold

## 2019-06-28 ENCOUNTER — Ambulatory Visit: Payer: Medicaid Other

## 2019-06-29 DIAGNOSIS — Z5181 Encounter for therapeutic drug level monitoring: Secondary | ICD-10-CM | POA: Diagnosis not present

## 2019-06-29 DIAGNOSIS — G8921 Chronic pain due to trauma: Secondary | ICD-10-CM | POA: Diagnosis not present

## 2019-06-29 DIAGNOSIS — F3341 Major depressive disorder, recurrent, in partial remission: Secondary | ICD-10-CM | POA: Diagnosis not present

## 2019-06-29 DIAGNOSIS — L02421 Furuncle of right axilla: Secondary | ICD-10-CM | POA: Diagnosis not present

## 2019-06-29 DIAGNOSIS — R3915 Urgency of urination: Secondary | ICD-10-CM | POA: Diagnosis not present

## 2019-06-29 DIAGNOSIS — M542 Cervicalgia: Secondary | ICD-10-CM | POA: Diagnosis not present

## 2019-06-29 DIAGNOSIS — E669 Obesity, unspecified: Secondary | ICD-10-CM | POA: Diagnosis not present

## 2019-06-29 DIAGNOSIS — Z720 Tobacco use: Secondary | ICD-10-CM | POA: Diagnosis not present

## 2019-06-29 DIAGNOSIS — Z131 Encounter for screening for diabetes mellitus: Secondary | ICD-10-CM | POA: Diagnosis not present

## 2019-07-03 ENCOUNTER — Ambulatory Visit: Payer: Medicaid Other

## 2019-07-05 ENCOUNTER — Other Ambulatory Visit: Payer: Self-pay

## 2019-07-05 ENCOUNTER — Ambulatory Visit: Payer: Medicaid Other | Attending: Family Medicine

## 2019-07-05 DIAGNOSIS — G8929 Other chronic pain: Secondary | ICD-10-CM | POA: Insufficient documentation

## 2019-07-05 DIAGNOSIS — M25511 Pain in right shoulder: Secondary | ICD-10-CM | POA: Diagnosis not present

## 2019-07-05 DIAGNOSIS — M6281 Muscle weakness (generalized): Secondary | ICD-10-CM | POA: Insufficient documentation

## 2019-07-05 DIAGNOSIS — M62830 Muscle spasm of back: Secondary | ICD-10-CM | POA: Diagnosis not present

## 2019-07-05 DIAGNOSIS — R293 Abnormal posture: Secondary | ICD-10-CM | POA: Insufficient documentation

## 2019-07-05 DIAGNOSIS — M546 Pain in thoracic spine: Secondary | ICD-10-CM | POA: Diagnosis not present

## 2019-07-07 NOTE — Patient Instructions (Signed)
   For pendulum exs complete forward flexion of trunk only as tolerated.

## 2019-07-07 NOTE — Therapy (Signed)
[place], [place], [address] Phone: [phone]   Fax:  [phone]  Physical Therapy Treatment/Discharge Summary  Patient Details  Name: Carol Gross MRN: [id number] Date of Birth: 1980-05-20 Referring Provider (PT): Fortunato Curling, MD   Encounter Date: 07/05/2019  PT End of Session - 07/07/19 2230    Visit Number  9    Number of Visits  17    PT Start Time  1410    PT Stop Time  7412    PT Time Calculation (min)  39 min    Activity Tolerance  Patient limited by pain    Behavior During Therapy  [facility] for tasks assessed/performed       Past Medical History:  Diagnosis Date  . Arthritis   . Back pain    sees ortho  . Carpal tunnel syndrome   . Chronic back pain   . Cocaine abuse ([place]) 09/15/2012   Started in early 20's and persistent since that time.    . Depression   . Fibromyalgia    ?  Marland Kitchen Headache(784.0)   . Hypertension    previously on. d/c'd by dr over month ago  . Menorrhagia with irregular cycle 07/03/2014  . Shortness of breath    exertion    Past Surgical History:  Procedure Laterality Date  . ABDOMINAL EXPOSURE N/A 06/25/2015   Procedure: ABDOMINAL EXPOSURE;  Surgeon: Rosetta Posner, MD;  Location: [facility];  Service: Vascular;  Laterality: N/A;  . ANTERIOR LUMBAR FUSION N/A 06/25/2015   Procedure: ANTERIOR LUMBAR INTERBODY  FUSION LUMBAR FIVE -SACRAL ONE;  Surgeon: Eustace Moore, MD;  Location: [place] [facility];  Service: Neurosurgery;  Laterality: N/A;  . LAMINECTOMY WITH POSTERIOR LATERAL ARTHRODESIS LEVEL 1 N/A 11/03/2016   Procedure: Posterior Lateral Fusion - Lumbar five-Sacrum one  with pedicle screw fixation;  Surgeon: Eustace Moore, MD;  Location: [place];  Service: Neurosurgery;  Laterality: N/A;  . LUMBAR LAMINECTOMY/DECOMPRESSION MICRODISCECTOMY  03/03/2012   Procedure: LUMBAR LAMINECTOMY/DECOMPRESSION MICRODISCECTOMY 1 LEVEL;  Surgeon: Floyce Stakes, MD;  Location: [facility];   Service: Neurosurgery;  Laterality: Left;  Left Lumbar five sacral one Diskectomy  . TUBAL LIGATION    . WISDOM TOOTH EXTRACTION      There were no vitals filed for this visit.  Subjective Assessment - 07/07/19 2207    Subjective  Pt reports she has sought a second opinion at [facility] for her R shoulder/neck pain and she additonal testing scheduled including an MRI. Pt states the nodule in her R axilla has drained more and is feeling better.    Currently in Pain?  Yes    Pain Score  8     Pain Location  Back   Neck   Pain Orientation  Posterior;Mid;Upper    Pain Descriptors / Indicators  Burning;Sharp    Pain Type  Chronic pain    Pain Radiating Towards  NA    Pain Onset  More than a month ago    Pain Frequency  Constant    Aggravating Factors   Everything    Pain Relieving Factors  Heating pad    Pain Score  7    Pain Location  Shoulder   Neck   Pain Orientation  Right;Anterior;Posterior;Lateral    Pain Descriptors / Indicators  Aching;Throbbing;Sharp    Pain Type  Chronic pain    Pain Onset  More than a month ago    Pain Frequency  Constant    Aggravating Factors   Everything    Pain Relieving Factors  Heating pad    Effect of Pain on Daily Activities  Limits use of R shoulder                       [facility] Adult PT Treatment/Exercise - 07/07/19 0001      Exercises   Exercises  Shoulder;Neck      Neck Exercises: Seated   Neck Retraction  10 reps;3 secs    Cervical Rotation  Both;10 reps    Cervical Rotation Limitations  3 secs    Lateral Flexion  5 reps;Both    Lateral Flexion Limitations  5 sec, anchored      Shoulder Exercises: Standing   Other Standing Exercises  Pendulum c forward trunk flexion as tolerated for sh. flex/ext; sh horizonal add/abd; 10x each             PT Education - 07/07/19 2225    Education Details  HEP to help maintain ROM of the neck and shoulder as pain tolerated.    Person(s) Educated  Patient    Methods   Explanation;Demonstration;Tactile cues;Verbal cues;Handout    Comprehension  Verbalized understanding;Returned demonstration;Verbal cues required;Tactile cues required       PT Short Term Goals - 07/07/19 2247      PT SHORT TERM GOAL #2   Title  Patient will increase lumbar flexion by 25%    Baseline  50% limited    Status  Deferred      PT SHORT TERM GOAL #3   Title  Patient will increase left hip flexion by 30 degrees    Status  Deferred        PT Long Term Goals - 07/07/19 2250      PT LONG TERM GOAL #1   Title  Pt will be Ind c a final HEP to address pain and function. MET    Status  Achieved      PT LONG TERM GOAL #2   Title  Improve FOTO limitation rating to 41%    Status  Deferred      PT LONG TERM GOAL #3   Title  Improve trunk flexion to 50d and L and R sidebending to 35d    Status  Deferred      PT LONG TERM GOAL #4   Title  Pt will report improved central mid-back pain in a 3-6/10 range for functional activities vs 9/10 on eval.    Status  Deferred      PT LONG TERM GOAL #5   Title  She will report LT leg symproms decr 30 % or more    Status  Deferred            Plan - 07/07/19 2233    Clinical Impression Statement  Pt continues to have R shoulder and neck pain at a significant level. Pt states she is completing neck exs as she is able to tolerate, and R shoulder pendulum exs were added to be completed as tolerated. Cervical and shoulder exs are to assist with maintaining ROM. With upcoming testing and pt's pain level not being improved by PT services. The pt has decided to stop PT at this time with the option of resuming with a new prescription as indicated. Pt is DCed from PT services.    PT Treatment/Interventions  ADLs/Self Care Home Management;Cryotherapy;Electrical Stimulation;Iontophoresis 33m/ml Dexamethasone;Gait training;Stair training;Functional mobility training;Therapeutic activities;Therapeutic exercise;Neuromuscular  re-education;Patient/family education;Manual  techniques;Passive range of motion;Taping;Dry needling;Spinal Manipulations;Joint Manipulations    PT Home Exercise Plan  [phone]       Patient will benefit from skilled therapeutic intervention in order to improve the following deficits and impairments:  Abnormal gait, Decreased endurance, Increased muscle spasms, Decreased activity tolerance, Decreased safety awareness, Pain, Postural dysfunction, Increased fascial restricitons, Decreased range of motion, Improper body mechanics, Difficulty walking, Obesity  Visit Diagnosis: Abnormal posture  Chronic midline thoracic back pain  Muscle spasm of back  Muscle weakness (generalized)  Chronic right shoulder pain     Problem List Patient Active Problem List   Diagnosis Date Noted  . Cellulitis and abscess of trunk 06/13/2019  . Acute bilateral thoracic back pain 02/18/2019  . Hip pain, chronic, left 02/18/2019  . S/P lumbar spinal fusion 06/25/2015  . Lumbar radiculopathy, chronic 12/05/2013  . Tobacco abuse 05/03/2011  . Obesity (BMI 30-39.9) 09/04/2009  . Lumbar back pain 04/09/2009  . Depression, major 11/06/2008    Gar Ponto MS, PT 07/07/19 10:57 PM   [facility] [address] [place], [place], [address] Phone: [phone]   Fax:  [phone]  Name: Carol Gross MRN: [id number] Date of Birth: 07/30/1980

## 2019-07-10 DIAGNOSIS — M25511 Pain in right shoulder: Secondary | ICD-10-CM | POA: Diagnosis not present

## 2019-07-10 DIAGNOSIS — M542 Cervicalgia: Secondary | ICD-10-CM | POA: Diagnosis not present

## 2019-07-17 DIAGNOSIS — F332 Major depressive disorder, recurrent severe without psychotic features: Secondary | ICD-10-CM | POA: Diagnosis not present

## 2019-07-17 DIAGNOSIS — F4310 Post-traumatic stress disorder, unspecified: Secondary | ICD-10-CM | POA: Diagnosis not present

## 2019-07-17 DIAGNOSIS — F411 Generalized anxiety disorder: Secondary | ICD-10-CM | POA: Diagnosis not present

## 2019-07-18 ENCOUNTER — Ambulatory Visit: Payer: Medicaid Other | Admitting: Physical Therapy

## 2019-07-18 DIAGNOSIS — M542 Cervicalgia: Secondary | ICD-10-CM | POA: Diagnosis not present

## 2019-07-18 DIAGNOSIS — Z720 Tobacco use: Secondary | ICD-10-CM | POA: Diagnosis not present

## 2019-07-18 DIAGNOSIS — E669 Obesity, unspecified: Secondary | ICD-10-CM | POA: Diagnosis not present

## 2019-07-18 DIAGNOSIS — R3915 Urgency of urination: Secondary | ICD-10-CM | POA: Diagnosis not present

## 2019-07-18 DIAGNOSIS — F3341 Major depressive disorder, recurrent, in partial remission: Secondary | ICD-10-CM | POA: Diagnosis not present

## 2019-07-27 DIAGNOSIS — F332 Major depressive disorder, recurrent severe without psychotic features: Secondary | ICD-10-CM | POA: Diagnosis not present

## 2019-07-27 DIAGNOSIS — F411 Generalized anxiety disorder: Secondary | ICD-10-CM | POA: Diagnosis not present

## 2019-07-27 DIAGNOSIS — F4310 Post-traumatic stress disorder, unspecified: Secondary | ICD-10-CM | POA: Diagnosis not present

## 2019-08-02 ENCOUNTER — Ambulatory Visit: Payer: Medicaid Other | Admitting: Physical Therapy

## 2019-08-13 DIAGNOSIS — F4310 Post-traumatic stress disorder, unspecified: Secondary | ICD-10-CM | POA: Diagnosis not present

## 2019-08-13 DIAGNOSIS — F411 Generalized anxiety disorder: Secondary | ICD-10-CM | POA: Diagnosis not present

## 2019-08-13 DIAGNOSIS — F332 Major depressive disorder, recurrent severe without psychotic features: Secondary | ICD-10-CM | POA: Diagnosis not present

## 2019-08-14 ENCOUNTER — Ambulatory Visit: Payer: Medicaid Other

## 2019-08-28 DIAGNOSIS — M25532 Pain in left wrist: Secondary | ICD-10-CM | POA: Diagnosis not present

## 2019-08-28 DIAGNOSIS — M25511 Pain in right shoulder: Secondary | ICD-10-CM | POA: Diagnosis not present

## 2019-08-28 DIAGNOSIS — M25531 Pain in right wrist: Secondary | ICD-10-CM | POA: Diagnosis not present

## 2019-08-29 ENCOUNTER — Other Ambulatory Visit: Payer: Self-pay

## 2019-08-29 ENCOUNTER — Ambulatory Visit: Payer: Medicaid Other | Attending: Family Medicine | Admitting: Physical Therapy

## 2019-08-29 ENCOUNTER — Encounter: Payer: Medicaid Other | Admitting: Physical Therapy

## 2019-08-29 DIAGNOSIS — G8929 Other chronic pain: Secondary | ICD-10-CM | POA: Diagnosis not present

## 2019-08-29 DIAGNOSIS — M25512 Pain in left shoulder: Secondary | ICD-10-CM | POA: Diagnosis not present

## 2019-08-29 DIAGNOSIS — R293 Abnormal posture: Secondary | ICD-10-CM | POA: Diagnosis not present

## 2019-08-29 DIAGNOSIS — M25522 Pain in left elbow: Secondary | ICD-10-CM | POA: Diagnosis not present
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
## 2019-09-10 DIAGNOSIS — F332 Major depressive disorder, recurrent severe without psychotic features: Secondary | ICD-10-CM | POA: Diagnosis not present

## 2019-09-10 DIAGNOSIS — F4310 Post-traumatic stress disorder, unspecified: Secondary | ICD-10-CM | POA: Diagnosis not present

## 2019-09-10 DIAGNOSIS — F411 Generalized anxiety disorder: Secondary | ICD-10-CM | POA: Diagnosis not present

## 2019-09-27 ENCOUNTER — Ambulatory Visit: Payer: Medicaid Other | Admitting: Physical Therapy

## 2019-10-08 ENCOUNTER — Ambulatory Visit: Payer: Medicaid Other | Admitting: Physical Therapy

## 2019-10-09 ENCOUNTER — Ambulatory Visit: Payer: Medicaid Other | Attending: Family Medicine | Admitting: Physical Therapy

## 2019-10-09 ENCOUNTER — Other Ambulatory Visit: Payer: Self-pay

## 2019-10-09 DIAGNOSIS — M545 Low back pain, unspecified: Secondary | ICD-10-CM

## 2019-10-09 DIAGNOSIS — R293 Abnormal posture: Secondary | ICD-10-CM

## 2019-10-09 DIAGNOSIS — M25511 Pain in right shoulder: Secondary | ICD-10-CM | POA: Diagnosis present

## 2019-10-09 DIAGNOSIS — M62830 Muscle spasm of back: Secondary | ICD-10-CM

## 2019-10-09 DIAGNOSIS — M6281 Muscle weakness (generalized): Secondary | ICD-10-CM | POA: Diagnosis present

## 2019-10-09 DIAGNOSIS — G8929 Other chronic pain: Secondary | ICD-10-CM

## 2019-10-09 NOTE — Therapy (Signed)
[facility] [address] [place], [place], [address] Phone: [phone]   Fax:  [phone]  Physical Therapy Evaluation  Patient Details  Name: Carol Gross MRN: [id number] Date of Birth: 02-Dec-1980 Referring Provider (PT): Ramond Marrowax Varkey MD   Encounter Date: 10/09/2019   PT End of Session - 10/09/19 1750    Visit Number 1    Number of Visits 4    Authorization Type Medicaid -- auth requested 8/10    Authorization - Visit Number 0    Authorization - Number of Visits 3    PT Start Time 1705    PT Stop Time 1750    PT Time Calculation (min) 45 min    Activity Tolerance Patient tolerated treatment well    Behavior During Therapy [facility] for tasks assessed/performed           Past Medical History:  Diagnosis Date  . Arthritis   . Back pain    sees ortho  . Carpal tunnel syndrome   . Chronic back pain   . Cocaine abuse (HCC) 09/15/2012   Started in early 20's and persistent since that time.    . Depression   . Fibromyalgia    ?  Marland Kitchen. Headache(784.0)   . Hypertension    previously on. d/c'd by dr over month ago  . Menorrhagia with irregular cycle 07/03/2014  . Shortness of breath    exertion    Past Surgical History:  Procedure Laterality Date  . ABDOMINAL EXPOSURE N/A 06/25/2015   Procedure: ABDOMINAL EXPOSURE;  Surgeon: Larina Earthlyodd F Early, MD;  Location: [facility];  Service: Vascular;  Laterality: N/A;  . ANTERIOR LUMBAR FUSION N/A 06/25/2015   Procedure: ANTERIOR LUMBAR INTERBODY  FUSION LUMBAR FIVE -SACRAL ONE;  Surgeon: Tia Alertavid S Jones, MD;  Location: [facility];  Service: Neurosurgery;  Laterality: N/A;  . LAMINECTOMY WITH POSTERIOR LATERAL ARTHRODESIS LEVEL 1 N/A 11/03/2016   Procedure: Posterior Lateral Fusion - Lumbar five-Sacrum one  with pedicle screw fixation;  Surgeon: Tia AlertJones, David S, MD;  [facility];  Service: Neurosurgery;  Laterality: N/A;  . LUMBAR LAMINECTOMY/DECOMPRESSION MICRODISCECTOMY  03/03/2012    Procedure: LUMBAR LAMINECTOMY/DECOMPRESSION MICRODISCECTOMY 1 LEVEL;  Surgeon: Karn CassisErnesto M Botero, MD;  Location: [facility];  Service: Neurosurgery;  Laterality: Left;  Left Lumbar five sacral one Diskectomy  . TUBAL LIGATION    . WISDOM TOOTH EXTRACTION      There were no vitals filed for this visit.    Subjective Assessment - 10/09/19 1712    Subjective Pt reports she saw PT for R shoulder and it was improving but could feel that it's locking back up. Pt reports continued R side pain especially in her R lower back. Pt reports she is unable to sleep and has been doing what PT has told her in the past to place pillows a certain way. Pt reports multifactorial life events causing stress and depression.    Pertinent History Spinal fusions, obesity, depression    Limitations Sitting;House hold activities;Lifting;Standing;Walking    How long can you sit comfortably? 15 min    How long can you stand comfortably? 15 min    How long can you walk comfortably? 30 min    Diagnostic tests CT: 04/21/19 cervical negative; DGs: R knee, R shoulder, R clavicle, R foot were all negative.    Patient Stated Goals For my new mid back pain to get better    Currently in Pain? Yes    Pain Score  8     Pain Location Back    Pain Orientation Right    Pain Descriptors / Indicators Aching;Pressure    Pain Type Chronic pain    Pain Score 8    Pain Location Shoulder    Pain Orientation Right    Pain Descriptors / Indicators Aching    Pain Type Chronic pain              [facility] PT Assessment - 10/09/19 0001      AROM   Right Shoulder Flexion 105 Degrees    Right Shoulder ABduction 100 Degrees    Lumbar Flexion 50%    Lumbar Extension 10%    Lumbar - Right Side Bend 50%    Lumbar - Left Side Bend 100%   feels good   Lumbar - Right Rotation 100%   tight pull   Lumbar - Left Rotation 100%      Strength   Overall Strength Comments Did not formally assess shoulder due to pain    Right Hip Flexion 3+/5     Right Hip Extension 3+/5    Right Hip External Rotation  5/5    Right Hip Internal Rotation 5/5    Right Hip ABduction 3+/5    Left Hip Flexion 5/5    Left Hip Extension 4+/5    Left Hip External Rotation 5/5    Left Hip Internal Rotation 5/5    Left Hip ABduction 4+/5    Right Knee Flexion 5/5    Right Knee Extension 3+/5    Left Knee Flexion 5/5    Left Knee Extension 4/5                      Objective measurements completed on examination: See above findings.               PT Education - 10/09/19 1800    Education Details Discussed breathing and stretching to reduce tension in her neck/shoulder and back. Discussed improving core strengthening.    Person(s) Educated Patient    Methods Explanation;Demonstration;Tactile cues;Verbal cues;Handout    Comprehension Verbalized understanding;Returned demonstration;Verbal cues required            PT Short Term Goals - 10/09/19 1758      PT SHORT TERM GOAL #1   Title Pt will be independent with breathing and stretching techniques in HEP to reduce neck/shoulder and back tension    Baseline Requires max cueing    Time 3    Period Weeks    Status New    Target Date 10/30/19      PT SHORT TERM GOAL #2   Title Patient will increase lumbar flexion by 25%    Baseline 50% limited    Time 3    Period Weeks    Status New    Target Date 10/30/19      PT SHORT TERM GOAL #3   Title Pt will improve R shoulder flexion to at least 120 deg for over the shoulder tasks without pain    Baseline 105 degrees before shoulder pain    Time 3    Period Weeks    Status New    Target Date 10/30/19             PT Long Term Goals - 10/09/19 1803      PT LONG TERM GOAL #1   Title Pt will be Ind c a final HEP to address pain and function.  Time 6    Period Weeks    Status New    Target Date 11/20/19      PT LONG TERM GOAL #2   Title Pt will have R shoulder ROM equal to L shoulder    Time 6    Period Weeks     Status New    Target Date 11/20/19      PT LONG TERM GOAL #3   Title Pt will have improved R LE strength to at least 4+/5 for activities such as squatting and lifting    Baseline Grossly 3+/5 due to pain    Time 6    Period Weeks    Status New    Target Date 11/20/19      PT LONG TERM GOAL #4   Title Pt will be able to sleep with pain <6/10    Time 6    Period Weeks    Status New    Target Date 11/20/19      PT LONG TERM GOAL #5   Title She will report LT leg symproms decr 30 % or more    Status Deferred                  Plan - 10/09/19 1752    Clinical Impression Statement Pt returns to [facility] after seeing new PCP who recommended PT for her continued R neck, shoulder, back, and knee pain. Pt demonstrates decreased lumbar and shoulder ROM due to pain affecting strength and functional mobility for home and community tasks. Pt with increased R upper trap activity likely resulting in impingement and decreased R shoulder and neck function. Pt would benefit from trial of PT to optimize her level of function.    Personal Factors and Comorbidities Comorbidity 2;Time since onset of injury/illness/exacerbation    Comorbidities obesity. lumbar fusion, depression, anxiety    Examination-Activity Limitations Bend;Lift;Locomotion Level;Sit;Squat;Stairs;Stand    Examination-Participation Restrictions Shop;Meal Prep;Driving;Community Activity    Stability/Clinical Decision Making Evolving/Moderate complexity    Clinical Decision Making Moderate    Rehab Potential Good    PT Frequency 2x / week    PT Duration 6 weeks    PT Treatment/Interventions ADLs/Self Care Home Management;Cryotherapy;Electrical Stimulation;Iontophoresis 4mg /ml Dexamethasone;Gait training;Stair training;Functional mobility training;Therapeutic activities;Therapeutic exercise;Neuromuscular re-education;Patient/family education;Manual techniques;Passive range of motion;Taping;Dry needling;Spinal Manipulations;Joint  Manipulations    PT Next Visit Plan Assess response to HEP. Continue to progress core exercises as able. Shoulder ROM and stretching as able. Manual if pt is able to tolerate it.    PT Home Exercise Plan [id number] -    Consulted and Agree with Plan of Care Patient           Patient will benefit from skilled therapeutic intervention in order to improve the following deficits and impairments:  Decreased endurance, Increased muscle spasms, Decreased activity tolerance, Decreased safety awareness, Pain, Postural dysfunction, Increased fascial restricitons, Decreased range of motion, Improper body mechanics, Obesity, Impaired perceived functional ability, Impaired flexibility, Hypomobility, Decreased mobility, Decreased strength, Difficulty walking, Impaired UE functional use  Visit Diagnosis: Abnormal posture - Plan: PT plan of care cert/re-cert  Muscle spasm of back - Plan: PT plan of care cert/re-cert  Muscle weakness (generalized) - Plan: PT plan of care cert/re-cert  Chronic right shoulder pain - Plan: PT plan of care cert/re-cert  Chronic bilateral low back pain without sciatica - Plan: PT plan of care cert/re-cert     Problem List Patient Active Problem List   Diagnosis Date Noted  . Cellulitis and abscess of  trunk 06/13/2019  . Acute bilateral thoracic back pain 02/18/2019  . Hip pain, chronic, left 02/18/2019  . S/P lumbar spinal fusion 06/25/2015  . Lumbar radiculopathy, chronic 12/05/2013  . Tobacco abuse 05/03/2011  . Obesity (BMI 30-39.9) 09/04/2009  . Lumbar back pain 04/09/2009  . Depression, major 11/06/2008    [facility] April Ma L Oluwanifemi Susman PT, DPT 10/09/2019, 6:10 PM  [facility] [address] [place], [place], [address] Phone: [phone]   Fax:  [phone]  Name: SERAIAH NOWACK MRN: [id number] Date of Birth: 04/19/1980

## 2019-10-22 DIAGNOSIS — F332 Major depressive disorder, recurrent severe without psychotic features: Secondary | ICD-10-CM | POA: Diagnosis not present

## 2019-10-22 DIAGNOSIS — F4310 Post-traumatic stress disorder, unspecified: Secondary | ICD-10-CM | POA: Diagnosis not present

## 2019-10-22 DIAGNOSIS — F411 Generalized anxiety disorder: Secondary | ICD-10-CM | POA: Diagnosis not present

## 2019-10-23 ENCOUNTER — Ambulatory Visit: Payer: Medicaid Other | Admitting: Physical Therapy

## 2019-10-24 ENCOUNTER — Telehealth: Payer: Self-pay | Admitting: Physical Therapy

## 2019-10-24 NOTE — Telephone Encounter (Signed)
Patient was contacted due to missing PT appointment. She stated she had to take her daughter to an appointment and it lasted longer than she expected so was not able to make it in. Patient was reminded of her next scheduled appointment on 10/25/2019 and she was informed of attendance no-show policy. She expressed understanding.   Rosana Hoes, PT, DPT, LAT, ATC 10/24/19  11:50 AM Phone: [phone] Fax: [phone]

## 2019-10-25 ENCOUNTER — Ambulatory Visit: Payer: Medicaid Other | Admitting: Physical Therapy

## 2019-10-30 ENCOUNTER — Ambulatory Visit: Payer: Medicaid Other | Admitting: Physical Therapy

## 2019-11-01 ENCOUNTER — Ambulatory Visit: Payer: Medicaid Other | Admitting: Physical Therapy

## 2019-11-06 ENCOUNTER — Ambulatory Visit: Payer: Medicaid Other | Admitting: Physical Therapy

## 2019-11-08 ENCOUNTER — Ambulatory Visit: Payer: Medicaid Other | Admitting: Physical Therapy

## 2019-11-13 ENCOUNTER — Encounter: Payer: Medicaid Other | Admitting: Physical Therapy

## 2019-11-15 ENCOUNTER — Encounter: Payer: Medicaid Other | Admitting: Physical Therapy

## 2019-12-21 DIAGNOSIS — F411 Generalized anxiety disorder: Secondary | ICD-10-CM | POA: Diagnosis not present

## 2019-12-21 DIAGNOSIS — F4310 Post-traumatic stress disorder, unspecified: Secondary | ICD-10-CM | POA: Diagnosis not present

## 2019-12-21 DIAGNOSIS — F332 Major depressive disorder, recurrent severe without psychotic features: Secondary | ICD-10-CM | POA: Diagnosis not present

## 2020-01-10 DIAGNOSIS — R0989 Other specified symptoms and signs involving the circulatory and respiratory systems: Secondary | ICD-10-CM | POA: Diagnosis not present

## 2020-01-10 DIAGNOSIS — M545 Low back pain, unspecified: Secondary | ICD-10-CM | POA: Diagnosis not present

## 2020-01-10 DIAGNOSIS — E669 Obesity, unspecified: Secondary | ICD-10-CM | POA: Diagnosis not present

## 2020-01-10 DIAGNOSIS — F3341 Major depressive disorder, recurrent, in partial remission: Secondary | ICD-10-CM | POA: Diagnosis not present

## 2020-01-10 DIAGNOSIS — Z720 Tobacco use: Secondary | ICD-10-CM | POA: Diagnosis not present

## 2020-01-21 DIAGNOSIS — M5440 Lumbago with sciatica, unspecified side: Secondary | ICD-10-CM | POA: Diagnosis not present

## 2020-01-21 DIAGNOSIS — E669 Obesity, unspecified: Secondary | ICD-10-CM | POA: Diagnosis not present

## 2020-01-21 DIAGNOSIS — F3341 Major depressive disorder, recurrent, in partial remission: Secondary | ICD-10-CM | POA: Diagnosis not present

## 2020-01-21 DIAGNOSIS — M542 Cervicalgia: Secondary | ICD-10-CM | POA: Diagnosis not present

## 2020-01-21 DIAGNOSIS — Z720 Tobacco use: Secondary | ICD-10-CM | POA: Diagnosis not present

## 2020-02-15 DIAGNOSIS — F4310 Post-traumatic stress disorder, unspecified: Secondary | ICD-10-CM | POA: Diagnosis not present

## 2020-02-15 DIAGNOSIS — F411 Generalized anxiety disorder: Secondary | ICD-10-CM | POA: Diagnosis not present

## 2020-02-15 DIAGNOSIS — F332 Major depressive disorder, recurrent severe without psychotic features: Secondary | ICD-10-CM | POA: Diagnosis not present

## 2020-04-11 DIAGNOSIS — F332 Major depressive disorder, recurrent severe without psychotic features: Secondary | ICD-10-CM | POA: Diagnosis not present

## 2020-04-11 DIAGNOSIS — F411 Generalized anxiety disorder: Secondary | ICD-10-CM | POA: Diagnosis not present

## 2020-04-11 DIAGNOSIS — F4310 Post-traumatic stress disorder, unspecified: Secondary | ICD-10-CM | POA: Diagnosis not present

## 2020-04-15 DIAGNOSIS — R5383 Other fatigue: Secondary | ICD-10-CM | POA: Diagnosis not present

## 2020-04-15 DIAGNOSIS — R0602 Shortness of breath: Secondary | ICD-10-CM | POA: Diagnosis not present

## 2020-04-15 DIAGNOSIS — M546 Pain in thoracic spine: Secondary | ICD-10-CM | POA: Diagnosis not present

## 2020-04-15 DIAGNOSIS — M5030 Other cervical disc degeneration, unspecified cervical region: Secondary | ICD-10-CM | POA: Diagnosis not present

## 2020-04-15 DIAGNOSIS — Z79899 Other long term (current) drug therapy: Secondary | ICD-10-CM | POA: Diagnosis not present

## 2020-04-15 DIAGNOSIS — T148XXA Other injury of unspecified body region, initial encounter: Secondary | ICD-10-CM | POA: Diagnosis not present

## 2020-04-15 DIAGNOSIS — N914 Secondary oligomenorrhea: Secondary | ICD-10-CM | POA: Diagnosis not present

## 2020-04-15 DIAGNOSIS — E559 Vitamin D deficiency, unspecified: Secondary | ICD-10-CM | POA: Diagnosis not present

## 2020-04-15 DIAGNOSIS — Z1159 Encounter for screening for other viral diseases: Secondary | ICD-10-CM | POA: Diagnosis not present

## 2020-04-15 DIAGNOSIS — M545 Low back pain, unspecified: Secondary | ICD-10-CM | POA: Diagnosis not present

## 2020-04-15 DIAGNOSIS — M129 Arthropathy, unspecified: Secondary | ICD-10-CM | POA: Diagnosis not present

## 2020-04-22 DIAGNOSIS — R9431 Abnormal electrocardiogram [ECG] [EKG]: Secondary | ICD-10-CM | POA: Diagnosis not present

## 2020-05-30 DIAGNOSIS — L853 Xerosis cutis: Secondary | ICD-10-CM | POA: Diagnosis not present

## 2020-06-06 DIAGNOSIS — F411 Generalized anxiety disorder: Secondary | ICD-10-CM | POA: Diagnosis not present

## 2020-06-06 DIAGNOSIS — F4310 Post-traumatic stress disorder, unspecified: Secondary | ICD-10-CM | POA: Diagnosis not present

## 2020-06-06 DIAGNOSIS — F332 Major depressive disorder, recurrent severe without psychotic features: Secondary | ICD-10-CM | POA: Diagnosis not present

## 2020-06-12 DIAGNOSIS — M961 Postlaminectomy syndrome, not elsewhere classified: Secondary | ICD-10-CM | POA: Diagnosis not present

## 2020-06-12 DIAGNOSIS — M9983 Other biomechanical lesions of lumbar region: Secondary | ICD-10-CM | POA: Diagnosis not present

## 2020-06-12 DIAGNOSIS — M461 Sacroiliitis, not elsewhere classified: Secondary | ICD-10-CM | POA: Diagnosis not present

## 2020-06-12 DIAGNOSIS — M5416 Radiculopathy, lumbar region: Secondary | ICD-10-CM | POA: Diagnosis not present

## 2020-06-17 DIAGNOSIS — Z8371 Family history of colonic polyps: Secondary | ICD-10-CM | POA: Diagnosis not present

## 2020-06-17 DIAGNOSIS — K5909 Other constipation: Secondary | ICD-10-CM | POA: Diagnosis not present

## 2020-06-17 DIAGNOSIS — R1084 Generalized abdominal pain: Secondary | ICD-10-CM | POA: Diagnosis not present

## 2020-06-17 DIAGNOSIS — K625 Hemorrhage of anus and rectum: Secondary | ICD-10-CM | POA: Diagnosis not present

## 2020-07-12 DIAGNOSIS — K625 Hemorrhage of anus and rectum: Secondary | ICD-10-CM | POA: Diagnosis not present

## 2020-07-12 DIAGNOSIS — Z01818 Encounter for other preprocedural examination: Secondary | ICD-10-CM | POA: Diagnosis not present

## 2020-07-12 DIAGNOSIS — K635 Polyp of colon: Secondary | ICD-10-CM | POA: Diagnosis not present

## 2020-07-23 DIAGNOSIS — K635 Polyp of colon: Secondary | ICD-10-CM | POA: Diagnosis not present

## 2020-08-05 DIAGNOSIS — F411 Generalized anxiety disorder: Secondary | ICD-10-CM | POA: Diagnosis not present

## 2020-08-05 DIAGNOSIS — F4310 Post-traumatic stress disorder, unspecified: Secondary | ICD-10-CM | POA: Diagnosis not present

## 2020-08-05 DIAGNOSIS — F332 Major depressive disorder, recurrent severe without psychotic features: Secondary | ICD-10-CM | POA: Diagnosis not present

## 2020-08-20 DIAGNOSIS — Z8371 Family history of colonic polyps: Secondary | ICD-10-CM | POA: Diagnosis not present

## 2020-08-20 DIAGNOSIS — K5909 Other constipation: Secondary | ICD-10-CM | POA: Diagnosis not present

## 2020-08-20 DIAGNOSIS — Z1211 Encounter for screening for malignant neoplasm of colon: Secondary | ICD-10-CM | POA: Diagnosis not present

## 2020-08-20 DIAGNOSIS — K648 Other hemorrhoids: Secondary | ICD-10-CM | POA: Diagnosis not present

## 2020-09-16 DIAGNOSIS — R0602 Shortness of breath: Secondary | ICD-10-CM | POA: Diagnosis not present

## 2020-09-16 DIAGNOSIS — Z0000 Encounter for general adult medical examination without abnormal findings: Secondary | ICD-10-CM | POA: Diagnosis not present

## 2020-09-30 DIAGNOSIS — R5383 Other fatigue: Secondary | ICD-10-CM | POA: Diagnosis not present

## 2020-09-30 DIAGNOSIS — E559 Vitamin D deficiency, unspecified: Secondary | ICD-10-CM | POA: Diagnosis not present

## 2020-09-30 DIAGNOSIS — Z0000 Encounter for general adult medical examination without abnormal findings: Secondary | ICD-10-CM | POA: Diagnosis not present

## 2020-09-30 DIAGNOSIS — Z124 Encounter for screening for malignant neoplasm of cervix: Secondary | ICD-10-CM | POA: Diagnosis not present

## 2020-09-30 DIAGNOSIS — Z7251 High risk heterosexual behavior: Secondary | ICD-10-CM | POA: Diagnosis not present

## 2020-09-30 DIAGNOSIS — Z13220 Encounter for screening for lipoid disorders: Secondary | ICD-10-CM | POA: Diagnosis not present

## 2020-09-30 DIAGNOSIS — E8881 Metabolic syndrome: Secondary | ICD-10-CM | POA: Diagnosis not present

## 2020-09-30 DIAGNOSIS — M129 Arthropathy, unspecified: Secondary | ICD-10-CM | POA: Diagnosis not present

## 2020-09-30 DIAGNOSIS — Z1159 Encounter for screening for other viral diseases: Secondary | ICD-10-CM | POA: Diagnosis not present

## 2020-10-02 DIAGNOSIS — F332 Major depressive disorder, recurrent severe without psychotic features: Secondary | ICD-10-CM | POA: Diagnosis not present

## 2020-10-02 DIAGNOSIS — F4310 Post-traumatic stress disorder, unspecified: Secondary | ICD-10-CM | POA: Diagnosis not present

## 2020-10-02 DIAGNOSIS — F411 Generalized anxiety disorder: Secondary | ICD-10-CM | POA: Diagnosis not present

## 2020-10-28 DIAGNOSIS — S161XXA Strain of muscle, fascia and tendon at neck level, initial encounter: Secondary | ICD-10-CM | POA: Diagnosis not present

## 2020-10-28 DIAGNOSIS — S46912A Strain of unspecified muscle, fascia and tendon at shoulder and upper arm level, left arm, initial encounter: Secondary | ICD-10-CM | POA: Diagnosis not present

## 2020-10-28 DIAGNOSIS — S39012A Strain of muscle, fascia and tendon of lower back, initial encounter: Secondary | ICD-10-CM | POA: Diagnosis not present
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
## 2020-11-18 DIAGNOSIS — G4750 Parasomnia, unspecified: Secondary | ICD-10-CM | POA: Diagnosis not present

## 2020-11-18 DIAGNOSIS — K581 Irritable bowel syndrome with constipation: Secondary | ICD-10-CM | POA: Diagnosis not present

## 2020-11-18 DIAGNOSIS — G4710 Hypersomnia, unspecified: Secondary | ICD-10-CM | POA: Diagnosis not present

## 2020-11-18 DIAGNOSIS — G2581 Restless legs syndrome: Secondary | ICD-10-CM | POA: Diagnosis not present

## 2020-11-24 DIAGNOSIS — R9431 Abnormal electrocardiogram [ECG] [EKG]: Secondary | ICD-10-CM | POA: Diagnosis not present

## 2020-11-29 DIAGNOSIS — R0683 Snoring: Secondary | ICD-10-CM | POA: Diagnosis not present

## 2020-12-01 DIAGNOSIS — F411 Generalized anxiety disorder: Secondary | ICD-10-CM | POA: Diagnosis not present

## 2020-12-01 DIAGNOSIS — F4310 Post-traumatic stress disorder, unspecified: Secondary | ICD-10-CM | POA: Diagnosis not present

## 2020-12-01 DIAGNOSIS — F332 Major depressive disorder, recurrent severe without psychotic features: Secondary | ICD-10-CM | POA: Diagnosis not present

## 2021-01-02 DIAGNOSIS — M5030 Other cervical disc degeneration, unspecified cervical region: Secondary | ICD-10-CM | POA: Diagnosis not present

## 2021-01-02 DIAGNOSIS — R0683 Snoring: Secondary | ICD-10-CM | POA: Diagnosis not present

## 2021-01-02 DIAGNOSIS — G4750 Parasomnia, unspecified: Secondary | ICD-10-CM | POA: Diagnosis not present

## 2021-01-02 DIAGNOSIS — G2581 Restless legs syndrome: Secondary | ICD-10-CM | POA: Diagnosis not present

## 2021-07-28 DIAGNOSIS — R1031 Right lower quadrant pain: Secondary | ICD-10-CM | POA: Diagnosis not present

## 2021-07-28 DIAGNOSIS — R1084 Generalized abdominal pain: Secondary | ICD-10-CM | POA: Diagnosis not present

## 2021-07-28 DIAGNOSIS — Z6841 Body Mass Index (BMI) 40.0 and over, adult: Secondary | ICD-10-CM | POA: Diagnosis not present

## 2021-07-29 DIAGNOSIS — R1031 Right lower quadrant pain: Secondary | ICD-10-CM | POA: Diagnosis not present

## 2021-08-26 DIAGNOSIS — D539 Nutritional anemia, unspecified: Secondary | ICD-10-CM | POA: Diagnosis not present

## 2021-08-26 DIAGNOSIS — R002 Palpitations: Secondary | ICD-10-CM | POA: Diagnosis not present

## 2021-08-26 DIAGNOSIS — R5383 Other fatigue: Secondary | ICD-10-CM | POA: Diagnosis not present

## 2021-08-26 DIAGNOSIS — E559 Vitamin D deficiency, unspecified: Secondary | ICD-10-CM | POA: Diagnosis not present

## 2021-08-26 DIAGNOSIS — Z3200 Encounter for pregnancy test, result unknown: Secondary | ICD-10-CM | POA: Diagnosis not present

## 2021-08-26 DIAGNOSIS — D508 Other iron deficiency anemias: Secondary | ICD-10-CM | POA: Diagnosis not present

## 2021-08-26 DIAGNOSIS — K219 Gastro-esophageal reflux disease without esophagitis: Secondary | ICD-10-CM | POA: Diagnosis not present

## 2021-08-26 DIAGNOSIS — Z6839 Body mass index (BMI) 39.0-39.9, adult: Secondary | ICD-10-CM | POA: Diagnosis not present

## 2021-08-26 DIAGNOSIS — Z13220 Encounter for screening for lipoid disorders: Secondary | ICD-10-CM | POA: Diagnosis not present

## 2021-08-26 DIAGNOSIS — Z0130 Encounter for examination of blood pressure without abnormal findings: Secondary | ICD-10-CM | POA: Diagnosis not present

## 2021-08-26 DIAGNOSIS — Z0000 Encounter for general adult medical examination without abnormal findings: Secondary | ICD-10-CM | POA: Diagnosis not present
